# Patient Record
Sex: Female | Born: 1980
Health system: Southern US, Community
[De-identification: ages and names within clinical notes are randomized; demographics above are authoritative.]

## PROBLEM LIST (undated history)

## (undated) ENCOUNTER — Inpatient Hospital Stay (HOSPITAL_COMMUNITY): Payer: Self-pay

## (undated) DIAGNOSIS — H269 Unspecified cataract: Secondary | ICD-10-CM

## (undated) DIAGNOSIS — R519 Headache, unspecified: Secondary | ICD-10-CM

## (undated) DIAGNOSIS — M7918 Myalgia, other site: Secondary | ICD-10-CM

## (undated) DIAGNOSIS — Z98891 History of uterine scar from previous surgery: Secondary | ICD-10-CM

## (undated) DIAGNOSIS — F32A Depression, unspecified: Secondary | ICD-10-CM

## (undated) DIAGNOSIS — M2550 Pain in unspecified joint: Secondary | ICD-10-CM

## (undated) DIAGNOSIS — M62838 Other muscle spasm: Secondary | ICD-10-CM

## (undated) DIAGNOSIS — F419 Anxiety disorder, unspecified: Secondary | ICD-10-CM

## (undated) DIAGNOSIS — R1310 Dysphagia, unspecified: Secondary | ICD-10-CM

## (undated) DIAGNOSIS — F329 Major depressive disorder, single episode, unspecified: Secondary | ICD-10-CM

## (undated) DIAGNOSIS — M791 Myalgia, unspecified site: Secondary | ICD-10-CM

## (undated) DIAGNOSIS — M792 Neuralgia and neuritis, unspecified: Secondary | ICD-10-CM

## (undated) DIAGNOSIS — R42 Dizziness and giddiness: Secondary | ICD-10-CM

## (undated) DIAGNOSIS — M5030 Other cervical disc degeneration, unspecified cervical region: Secondary | ICD-10-CM

## (undated) DIAGNOSIS — R51 Headache: Secondary | ICD-10-CM

## (undated) DIAGNOSIS — R52 Pain, unspecified: Secondary | ICD-10-CM

## (undated) DIAGNOSIS — R29898 Other symptoms and signs involving the musculoskeletal system: Secondary | ICD-10-CM

## (undated) DIAGNOSIS — M609 Myositis, unspecified: Secondary | ICD-10-CM

## (undated) DIAGNOSIS — M5412 Radiculopathy, cervical region: Secondary | ICD-10-CM

## (undated) HISTORY — DX: Other symptoms and signs involving the musculoskeletal system: R29.898

## (undated) HISTORY — DX: Anxiety disorder, unspecified: F41.9

## (undated) HISTORY — DX: Headache, unspecified: R51.9

## (undated) HISTORY — DX: Unspecified cataract: H26.9

## (undated) HISTORY — DX: Dizziness and giddiness: R42

## (undated) HISTORY — DX: Dysphagia, unspecified: R13.10

## (undated) HISTORY — DX: Headache: R51

## (undated) HISTORY — DX: Myalgia, unspecified site: M79.10

## (undated) HISTORY — DX: Depression, unspecified: F32.A

## (undated) HISTORY — DX: Other muscle spasm: M62.838

## (undated) HISTORY — DX: Neuralgia and neuritis, unspecified: M79.2

## (undated) HISTORY — DX: Myositis, unspecified: M60.9

## (undated) HISTORY — DX: Radiculopathy, cervical region: M54.12

## (undated) HISTORY — DX: Pain in unspecified joint: M25.50

## (undated) HISTORY — DX: Major depressive disorder, single episode, unspecified: F32.9

## (undated) HISTORY — DX: Myalgia, other site: M79.18

## (undated) HISTORY — DX: Other cervical disc degeneration, unspecified cervical region: M50.30

---

## 2001-10-28 ENCOUNTER — Other Ambulatory Visit: Admission: RE | Admit: 2001-10-28 | Discharge: 2001-10-28 | Payer: Self-pay | Admitting: Obstetrics & Gynecology

## 2005-04-24 HISTORY — PX: ECTOPIC PREGNANCY SURGERY: SHX613

## 2006-01-25 ENCOUNTER — Emergency Department (HOSPITAL_COMMUNITY): Admission: EM | Admit: 2006-01-25 | Discharge: 2006-01-25 | Payer: Self-pay | Admitting: Emergency Medicine

## 2008-11-11 ENCOUNTER — Encounter: Admission: RE | Admit: 2008-11-11 | Discharge: 2008-11-11 | Payer: Self-pay | Admitting: Family Medicine

## 2008-12-17 ENCOUNTER — Ambulatory Visit (HOSPITAL_COMMUNITY): Admission: RE | Admit: 2008-12-17 | Discharge: 2008-12-17 | Payer: Self-pay | Admitting: Chiropractic Medicine

## 2009-06-26 ENCOUNTER — Emergency Department (HOSPITAL_COMMUNITY): Admission: EM | Admit: 2009-06-26 | Discharge: 2009-06-26 | Payer: Self-pay | Admitting: Emergency Medicine

## 2009-12-13 ENCOUNTER — Encounter
Admission: RE | Admit: 2009-12-13 | Discharge: 2009-12-13 | Payer: Self-pay | Admitting: Physical Medicine and Rehabilitation

## 2010-01-18 ENCOUNTER — Encounter
Admission: RE | Admit: 2010-01-18 | Discharge: 2010-01-18 | Payer: Self-pay | Admitting: Physical Medicine and Rehabilitation

## 2010-04-24 HISTORY — PX: CERVICAL FUSION: SHX112

## 2010-07-17 LAB — URINALYSIS, ROUTINE W REFLEX MICROSCOPIC
Bilirubin Urine: NEGATIVE
Glucose, UA: NEGATIVE mg/dL
Hgb urine dipstick: NEGATIVE
Ketones, ur: NEGATIVE mg/dL
Nitrite: NEGATIVE
Protein, ur: NEGATIVE mg/dL
Specific Gravity, Urine: 1.01 (ref 1.005–1.030)
Urobilinogen, UA: 0.2 mg/dL (ref 0.0–1.0)
pH: 7.5 (ref 5.0–8.0)

## 2010-07-17 LAB — URINE MICROSCOPIC-ADD ON

## 2010-07-17 LAB — POCT PREGNANCY, URINE: Preg Test, Ur: NEGATIVE

## 2010-07-30 LAB — HCG, SERUM, QUALITATIVE: Preg, Serum: NEGATIVE

## 2010-08-13 ENCOUNTER — Emergency Department (HOSPITAL_COMMUNITY): Payer: No Typology Code available for payment source

## 2010-08-13 ENCOUNTER — Emergency Department (HOSPITAL_COMMUNITY)
Admission: EM | Admit: 2010-08-13 | Discharge: 2010-08-14 | Disposition: A | Discharge status: Home / Self Care | Payer: No Typology Code available for payment source | Attending: Emergency Medicine | Admitting: Emergency Medicine

## 2010-08-13 DIAGNOSIS — R51 Headache: Secondary | ICD-10-CM | POA: Insufficient documentation

## 2010-08-13 DIAGNOSIS — S058X9A Other injuries of unspecified eye and orbit, initial encounter: Secondary | ICD-10-CM | POA: Insufficient documentation

## 2010-08-13 DIAGNOSIS — H5789 Other specified disorders of eye and adnexa: Secondary | ICD-10-CM | POA: Insufficient documentation

## 2010-08-13 DIAGNOSIS — F411 Generalized anxiety disorder: Secondary | ICD-10-CM | POA: Insufficient documentation

## 2010-08-13 DIAGNOSIS — H538 Other visual disturbances: Secondary | ICD-10-CM | POA: Insufficient documentation

## 2010-08-13 DIAGNOSIS — R220 Localized swelling, mass and lump, head: Secondary | ICD-10-CM | POA: Insufficient documentation

## 2010-08-13 DIAGNOSIS — M545 Low back pain, unspecified: Secondary | ICD-10-CM | POA: Insufficient documentation

## 2010-08-13 DIAGNOSIS — M542 Cervicalgia: Secondary | ICD-10-CM | POA: Insufficient documentation

## 2010-08-13 DIAGNOSIS — R109 Unspecified abdominal pain: Secondary | ICD-10-CM | POA: Insufficient documentation

## 2010-08-13 DIAGNOSIS — IMO0002 Reserved for concepts with insufficient information to code with codable children: Secondary | ICD-10-CM | POA: Insufficient documentation

## 2010-08-13 DIAGNOSIS — S0510XA Contusion of eyeball and orbital tissues, unspecified eye, initial encounter: Secondary | ICD-10-CM | POA: Insufficient documentation

## 2010-08-14 LAB — CBC
HCT: 37.5 % (ref 36.0–46.0)
Hemoglobin: 12.2 g/dL (ref 12.0–15.0)
MCH: 26.6 pg (ref 26.0–34.0)
MCHC: 32.5 g/dL (ref 30.0–36.0)
MCV: 81.9 fL (ref 78.0–100.0)
Platelets: 281 10*3/uL (ref 150–400)
RBC: 4.58 MIL/uL (ref 3.87–5.11)
RDW: 15.9 % — ABNORMAL HIGH (ref 11.5–15.5)
WBC: 11.5 10*3/uL — ABNORMAL HIGH (ref 4.0–10.5)

## 2010-08-14 LAB — PROTIME-INR
INR: 0.98 (ref 0.00–1.49)
Prothrombin Time: 13.2 seconds (ref 11.6–15.2)

## 2010-08-14 LAB — DIFFERENTIAL
Basophils Absolute: 0 10*3/uL (ref 0.0–0.1)
Basophils Relative: 0 % (ref 0–1)
Eosinophils Absolute: 0 10*3/uL (ref 0.0–0.7)
Eosinophils Relative: 0 % (ref 0–5)
Lymphocytes Relative: 5 % — ABNORMAL LOW (ref 12–46)
Lymphs Abs: 0.6 10*3/uL — ABNORMAL LOW (ref 0.7–4.0)
Monocytes Absolute: 0.4 10*3/uL (ref 0.1–1.0)
Monocytes Relative: 4 % (ref 3–12)
Neutro Abs: 10.5 10*3/uL — ABNORMAL HIGH (ref 1.7–7.7)
Neutrophils Relative %: 91 % — ABNORMAL HIGH (ref 43–77)

## 2010-08-15 LAB — SICKLE CELL SCREEN: Sickle Cell Screen: NEGATIVE

## 2010-08-16 LAB — HEMOGLOBINOPATHY EVALUATION
Hemoglobin Other: 0 % (ref 0.0–0.0)
Hgb A2 Quant: 2.5 % (ref 2.2–3.2)
Hgb A: 97.1 % (ref 96.8–97.8)
Hgb F Quant: 0.4 % (ref 0.0–2.0)
Hgb S Quant: 0 % (ref 0.0–0.0)

## 2011-04-21 ENCOUNTER — Emergency Department (INDEPENDENT_AMBULATORY_CARE_PROVIDER_SITE_OTHER)
Admission: EM | Admit: 2011-04-21 | Discharge: 2011-04-21 | Disposition: A | Discharge status: Home / Self Care | Payer: BC Managed Care – PPO | Source: Home / Self Care | Attending: Family Medicine | Admitting: Family Medicine

## 2011-04-21 ENCOUNTER — Emergency Department (INDEPENDENT_AMBULATORY_CARE_PROVIDER_SITE_OTHER): Payer: BC Managed Care – PPO

## 2011-04-21 ENCOUNTER — Encounter: Payer: Self-pay | Admitting: *Deleted

## 2011-04-21 DIAGNOSIS — S6980XA Other specified injuries of unspecified wrist, hand and finger(s), initial encounter: Secondary | ICD-10-CM

## 2011-04-21 DIAGNOSIS — S6990XA Unspecified injury of unspecified wrist, hand and finger(s), initial encounter: Secondary | ICD-10-CM

## 2011-04-21 DIAGNOSIS — S6992XA Unspecified injury of left wrist, hand and finger(s), initial encounter: Secondary | ICD-10-CM

## 2011-04-21 MED ORDER — IBUPROFEN 600 MG PO TABS: 600.0000 mg | ORAL_TABLET | Freq: Three times a day (TID) | ORAL | Status: AC | PRN

## 2011-04-21 MED ORDER — TRAMADOL HCL 50 MG PO TABS: 50.0000 mg | ORAL_TABLET | Freq: Four times a day (QID) | ORAL | Status: AC | PRN

## 2011-04-21 NOTE — ED Notes (Signed)
Pt  States  She  Has  An     Old  Finger    Tendon injury    To  l  Small  Finger  She  Reports  While  At the  Gym today  She  Injured  Her  Finger      She  Reports  She  Snagged  It  On a   Persons  Shirt  The  Finger is  Swollen  And  painfull

## 2011-04-23 NOTE — ED Provider Notes (Signed)
History     CSN: [id number]  Arrival date & time 04/21/11  8295   First MD Initiated Contact with Patient 04/21/11 1948      Chief Complaint  Patient presents with  . Finger Injury    (Consider location/radiation/quality/duration/timing/severity/associated sxs/prior treatment) HPI Comments: 30 y/o female with h/o extensor injury in left 5th finger for what she is being followed by Dr. Merlyn Lot. Here c/o re injury in same digit today while she was in the gym and got caught in another person's shirt when she extender her arms while she was working out. States noticed increased pain and swelling. Pt mentioned several times she was wearing the dynamic splint provided at orthopedist office.   History reviewed. No pertinent past medical history.  Past Surgical History  Procedure Date  . Cervical fusion     History reviewed. No pertinent family history.  History  Substance Use Topics  . Smoking status: Not on file  . Smokeless tobacco: Not on file  . Alcohol Use:     OB History    Grav Para Term Preterm Abortions TAB SAB Ect Mult Living                  Review of Systems  All other systems reviewed and are negative.    Allergies  Review of patient's allergies indicates no known allergies.  Home Medications   Current Outpatient Rx  Name Route Sig Dispense Refill  . CYCLOBENZAPRINE HCL 10 MG PO TABS Oral Take 10 mg by mouth 3 (three) times daily as needed.      Marland Kitchen NAPROXEN 500 MG PO TABS Oral Take 500 mg by mouth 2 (two) times daily with a meal.      . IBUPROFEN 600 MG PO TABS Oral Take 1 tablet (600 mg total) by mouth every 8 (eight) hours as needed for pain. 20 tablet 0  . TRAMADOL HCL 50 MG PO TABS Oral Take 1 tablet (50 mg total) by mouth every 6 (six) hours as needed for pain. Maximum dose= 8 tablets per day 15 tablet 0    BP 124/89  Pulse 80  Temp(Src) 98.2 F (36.8 C) (Oral)  Resp 16  SpO2 100%  LMP 03/22/2011  Physical Exam  Nursing note and vitals  reviewed. Constitutional: She is oriented to person, place, and time.  Cardiovascular: Normal rate, regular rhythm and normal heart sounds.   Pulmonary/Chest: Breath sounds normal.  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 With TTP and moderate swelling of PIPJ. No bruising or swelling in any other areas. Distal phalange looks oblique and internally deviated although noticed similar in right hand. Decreased range of motion not new as per patient.  Neurological: She is alert and oriented to person, place, and time.    ED Course  Procedures (including critical care time)  Labs Reviewed - No data to display Dg Finger Little Left  04/21/2011  *RADIOLOGY REPORT*  Clinical Data: Small finger injury 3 months ago.  Popping sensation with increased pain and deformity.  LEFT LITTLE FINGER 2+V  Comparison: None.  Findings: The small finger is flexed at the proximal interphalangeal joint and extended at the distal interphalangeal joint, with soft tissue swelling dorsal to the proximal interphalangeal joint.  No fracture or dislocation is observed.  A small degenerative subcortical cyst or geode is present medially in the head of the proximal phalanx.  IMPRESSION:  1.  Boutonniere deformity of the small finger, with soft tissue swelling  dorsal to the proximal interphalangeal joint.  Original Report Authenticated By: Dellia Cloud, M.D.     1. Injury of fifth finger of left hand       MDM  No new bone injury or fracture in X-rays. Boutonniere deformity not new. Pt has appointment with hand specialist in 2 days. Was placed in same splint she has been using and encouraged to keep in place and avoid reinjury. Prescriptions for ibuprofen and tramadol given. Follow up with hand specialist as scheduled.        Sharin Grave, MD 04/23/11 [phone]

## 2011-10-21 ENCOUNTER — Ambulatory Visit: Payer: BC Managed Care – PPO | Admitting: Family Medicine

## 2011-10-21 VITALS — BP 122/82 | HR 65 | Temp 97.5°F | Resp 16 | Ht 66.58 in | Wt 193.4 lb

## 2011-10-21 DIAGNOSIS — H6120 Impacted cerumen, unspecified ear: Secondary | ICD-10-CM

## 2011-10-21 DIAGNOSIS — H8110 Benign paroxysmal vertigo, unspecified ear: Secondary | ICD-10-CM

## 2011-10-21 MED ORDER — MECLIZINE HCL 32 MG PO TABS: 32.0000 mg | ORAL_TABLET | Freq: Three times a day (TID) | ORAL | Status: DC | PRN

## 2011-10-21 NOTE — Progress Notes (Signed)
31 yo woman with acute vertigo and nausea upon awakening yesterday.  No tinnitus, hearing loss or otalgia.  No h/o vertigo, head trauma Patient has a h/o neck surgery and left eye injury with vision loss (remote) No headache or neck pain, vision change  O:  No acute distress Fundi:  Normal Tm's: cerumen right ear canal lavaged and now clear Neck:  Keloid Oroph:  Normal CNIII-XII: normal Mental status:  Normal Motor: intact x 4 extrem  A:  Acute vertigo  P:  Meclizine 1. Vertigo, benign positional  meclizine (ANTIVERT) 32 MG tablet

## 2011-10-23 ENCOUNTER — Other Ambulatory Visit: Payer: Self-pay | Admitting: *Deleted

## 2011-10-23 MED ORDER — MECLIZINE HCL 25 MG PO TABS: 25.0000 mg | ORAL_TABLET | Freq: Three times a day (TID) | ORAL | Status: AC | PRN

## 2011-10-24 ENCOUNTER — Ambulatory Visit (INDEPENDENT_AMBULATORY_CARE_PROVIDER_SITE_OTHER): Payer: BC Managed Care – PPO | Admitting: Internal Medicine

## 2011-10-24 VITALS — BP 130/92 | HR 76 | Temp 98.3°F | Resp 16 | Ht 65.25 in | Wt 192.4 lb

## 2011-10-24 DIAGNOSIS — R42 Dizziness and giddiness: Secondary | ICD-10-CM

## 2011-10-24 DIAGNOSIS — R51 Headache: Secondary | ICD-10-CM

## 2011-10-24 LAB — POCT CBC
Granulocyte percent: 55.7 %G (ref 37–80)
HCT, POC: 42.2 % (ref 37.7–47.9)
Hemoglobin: 12.4 g/dL (ref 12.2–16.2)
Lymph, poc: 2 (ref 0.6–3.4)
MCH, POC: 26 pg — AB (ref 27–31.2)
MCHC: 29.4 g/dL — AB (ref 31.8–35.4)
MCV: 88.5 fL (ref 80–97)
MID (cbc): 0.3 (ref 0–0.9)
MPV: 9 fL (ref 0–99.8)
POC Granulocyte: 2.9 (ref 2–6.9)
POC LYMPH PERCENT: 37.7 %L (ref 10–50)
POC MID %: 6.6 %M (ref 0–12)
Platelet Count, POC: 305 10*3/uL (ref 142–424)
RBC: 4.77 M/uL (ref 4.04–5.48)
RDW, POC: 15.6 %
WBC: 5.2 10*3/uL (ref 4.6–10.2)

## 2011-10-24 NOTE — Progress Notes (Signed)
  Subjective:    Patient ID: Victoria Wagner, female    DOB: 04-02-1981, 31 y.o.   MRN: [id number]  HPI C. Recent visit/only mildly improved/unable to work Unable to eat, headache.  Feels like she is on a cruise ship.  No trouble sleeping.  Feels like the room is spinning and worse in the morning.  Lying down and getting up is a problem Has a bifrontal pressure headache, made worse with movement/Mild nausea with decreased appetite but no vomiting No weakness   Review of Systems No blurred vision, no fever, no diarrhea, no dysuria or frequency, no edema.  No URI or sinus symptoms  ? Palpatations- Feels like rhythm is just a little faster, no chest pains.   History of surgery on neck which contributes to some headaches, but this headache is different     Objective:   Physical Exam Blood pressure normal Pupils reactive to light, left pupil larger than right pupil consistent with past history EOMs conjugate No nystagmus TMs clear/canals clear Oropharynx clear Thyroid without nodules or swelling No lymphadenopathy Lying down produces dizziness Reflexes normal Heart regular without murmur Abdomen benign Sitting up produces dizziness Romberg is negative Finger to nose intact Deep tendon reflexes symmetrical without clonus or Babinski      Results for orders placed in visit on 10/24/11  POCT CBC      Component Value Range   WBC 5.2  4.6 - 10.2 K/uL   Lymph, poc 2.0  0.6 - 3.4   POC LYMPH PERCENT 37.7  10 - 50 %L   MID (cbc) 0.3  0 - 0.9   POC MID % 6.6  0 - 12 %M   POC Granulocyte 2.9  2 - 6.9   Granulocyte percent 55.7  37 - 80 %G   RBC 4.77  4.04 - 5.48 M/uL   Hemoglobin 12.4  12.2 - 16.2 g/dL   HCT, POC 40.9  81.1 - 47.9 %   MCV 88.5  80 - 97 fL   MCH, POC 26.0 (*) 27 - 31.2 pg   MCHC 29.4 (*) 31.8 - 35.4 g/dL   RDW, POC 91.4     Platelet Count, POC 305  142 - 424 K/uL   MPV 9.0  0 - 99.8 fL        Assessment & Plan:  Problem #1 vertigo Continue meclizine Add  postural maneuvers brandt -darhoff  Recheck 5 days if not well

## 2011-12-30 ENCOUNTER — Emergency Department (HOSPITAL_COMMUNITY): Payer: BC Managed Care – PPO

## 2011-12-30 ENCOUNTER — Emergency Department (HOSPITAL_COMMUNITY)
Admission: EM | Admit: 2011-12-30 | Discharge: 2011-12-30 | Disposition: A | Discharge status: Home / Self Care | Payer: BC Managed Care – PPO | Attending: Emergency Medicine | Admitting: Emergency Medicine

## 2011-12-30 ENCOUNTER — Encounter (HOSPITAL_COMMUNITY): Payer: Self-pay | Admitting: Emergency Medicine

## 2011-12-30 DIAGNOSIS — N390 Urinary tract infection, site not specified: Secondary | ICD-10-CM | POA: Insufficient documentation

## 2011-12-30 DIAGNOSIS — O2390 Unspecified genitourinary tract infection in pregnancy, unspecified trimester: Secondary | ICD-10-CM | POA: Insufficient documentation

## 2011-12-30 DIAGNOSIS — R109 Unspecified abdominal pain: Secondary | ICD-10-CM | POA: Insufficient documentation

## 2011-12-30 DIAGNOSIS — Z3490 Encounter for supervision of normal pregnancy, unspecified, unspecified trimester: Secondary | ICD-10-CM

## 2011-12-30 LAB — COMPREHENSIVE METABOLIC PANEL
ALT: 10 U/L (ref 0–35)
AST: 12 U/L (ref 0–37)
Albumin: 3.1 g/dL — ABNORMAL LOW (ref 3.5–5.2)
Alkaline Phosphatase: 71 U/L (ref 39–117)
BUN: 11 mg/dL (ref 6–23)
CO2: 28 mEq/L (ref 19–32)
Calcium: 9.1 mg/dL (ref 8.4–10.5)
Chloride: 104 mEq/L (ref 96–112)
Creatinine, Ser: 0.87 mg/dL (ref 0.50–1.10)
GFR calc Af Amer: >90 mL/min (ref >90)
GFR calc non Af Amer: 89 mL/min — ABNORMAL LOW (ref >90)
Glucose, Bld: 84 mg/dL (ref 70–99)
Potassium: 3.8 mEq/L (ref 3.5–5.1)
Sodium: 139 mEq/L (ref 135–145)
Total Bilirubin: 0.1 mg/dL — ABNORMAL LOW (ref 0.3–1.2)
Total Protein: 6.9 g/dL (ref 6.0–8.3)

## 2011-12-30 LAB — CBC WITH DIFFERENTIAL/PLATELET
Basophils Absolute: 0 10*3/uL (ref 0.0–0.1)
Basophils Relative: 0 % (ref 0–1)
Eosinophils Absolute: 0.1 10*3/uL (ref 0.0–0.7)
Eosinophils Relative: 1 % (ref 0–5)
HCT: 38.1 % (ref 36.0–46.0)
Hemoglobin: 12.3 g/dL (ref 12.0–15.0)
Lymphocytes Relative: 33 % (ref 12–46)
Lymphs Abs: 2.1 10*3/uL (ref 0.7–4.0)
MCH: 27.4 pg (ref 26.0–34.0)
MCHC: 32.3 g/dL (ref 30.0–36.0)
MCV: 84.9 fL (ref 78.0–100.0)
Monocytes Absolute: 0.4 10*3/uL (ref 0.1–1.0)
Monocytes Relative: 6 % (ref 3–12)
Neutro Abs: 3.7 10*3/uL (ref 1.7–7.7)
Neutrophils Relative %: 60 % (ref 43–77)
Platelets: 252 10*3/uL (ref 150–400)
RBC: 4.49 MIL/uL (ref 3.87–5.11)
RDW: 14.2 % (ref 11.5–15.5)
WBC: 6.2 10*3/uL (ref 4.0–10.5)

## 2011-12-30 LAB — URINALYSIS, ROUTINE W REFLEX MICROSCOPIC
Bilirubin Urine: NEGATIVE
Glucose, UA: NEGATIVE mg/dL
Hgb urine dipstick: NEGATIVE
Ketones, ur: NEGATIVE mg/dL
Nitrite: NEGATIVE
Protein, ur: NEGATIVE mg/dL
Specific Gravity, Urine: 1.022 (ref 1.005–1.030)
Urobilinogen, UA: 1 mg/dL (ref 0.0–1.0)
pH: 7.5 (ref 5.0–8.0)

## 2011-12-30 LAB — URINE MICROSCOPIC-ADD ON

## 2011-12-30 LAB — HCG, QUANTITATIVE, PREGNANCY: hCG, Beta Chain, Quant, S: 40471 m[IU]/mL — ABNORMAL HIGH (ref <5)

## 2011-12-30 LAB — LIPASE, BLOOD: Lipase: 36 U/L (ref 11–59)

## 2011-12-30 MED ORDER — NITROFURANTOIN MONOHYD MACRO 100 MG PO CAPS: 100.0000 mg | ORAL_CAPSULE | Freq: Two times a day (BID) | ORAL | Status: AC

## 2011-12-30 NOTE — ED Notes (Signed)
Pt. Stated, I've had an ectopic pregnancy in 2007 and I'm having the same pain on my lt. Lateral side and my abd. A little.  My Dr. York Spaniel to come here if i had this pain

## 2011-12-30 NOTE — ED Provider Notes (Signed)
History     CSN: [id number]  Arrival date & time 12/30/11  8413   First MD Initiated Contact with Patient 12/30/11 0805      Chief Complaint  Patient presents with  . Abdominal Pain    (Consider location/radiation/quality/duration/timing/severity/associated sxs/prior treatment) Patient is a 31 y.o. female presenting with abdominal pain. The history is provided by the patient.  Abdominal Pain The primary symptoms of the illness include abdominal pain. The primary symptoms of the illness do not include nausea, vomiting, vaginal discharge or vaginal bleeding.  Symptoms associated with the illness do not include chills or constipation.   patient's had some left-sided crampy abdominal pain for last couple days. She is [redacted] weeks pregnant and has had a home pregnancy test and has been seen by her gynecologist. She states she had a previous ectopic pregnancy in the right tube in 2007. This pain is crampy and feels somewhat like that. No vaginal bleeding or discharge. No nausea vomiting or diarrhea. No fevers. No change in appetite.  History reviewed. No pertinent past medical history.  Past Surgical History  Procedure Date  . Cervical fusion   . Ectopic pregnancy surgery     No family history on file.  History  Substance Use Topics  . Smoking status: Never Smoker   . Smokeless tobacco: Not on file  . Alcohol Use: No    OB History    Grav Para Term Preterm Abortions TAB SAB Ect Mult Living   1               Review of Systems  Constitutional: Negative for chills.  Respiratory: Negative for chest tightness.   Gastrointestinal: Positive for abdominal pain. Negative for nausea, vomiting and constipation.  Genitourinary: Negative for flank pain, vaginal bleeding and vaginal discharge.  Neurological: Negative for syncope and headaches.    Allergies  Review of patient's allergies indicates no known allergies.  Home Medications   Current Outpatient Rx  Name Route Sig Dispense  Refill  . CYCLOBENZAPRINE HCL 5 MG PO TABS Oral Take 5 mg by mouth 3 (three) times daily as needed. Muscle spasms    . OVER THE COUNTER MEDICATION Topical Apply 1 application topically 2 (two) times daily as needed. Apply to neck for pain DERMA TEND    . PRENATAL MULTIVITAMIN CH Oral Take 1 tablet by mouth daily.    Marland Kitchen NITROFURANTOIN MONOHYD MACRO 100 MG PO CAPS Oral Take 1 capsule (100 mg total) by mouth 2 (two) times daily. 10 capsule 0    BP 129/78  Pulse 74  Temp 98.5 F (36.9 C) (Oral)  Resp 20  SpO2 100%  LMP 11/19/2011  Physical Exam  Nursing note and vitals reviewed. Constitutional: She is oriented to person, place, and time. She appears well-developed and well-nourished.  HENT:  Head: Normocephalic and atraumatic.  Eyes: EOM are normal. Pupils are equal, round, and reactive to light.  Neck: Normal range of motion. Neck supple.  Cardiovascular: Normal rate, regular rhythm and normal heart sounds.   No murmur heard. Pulmonary/Chest: Effort normal and breath sounds normal. No respiratory distress. She has no wheezes. She has no rales.  Abdominal: Soft. Bowel sounds are normal. She exhibits no distension. There is tenderness. There is no rebound and no guarding.       Tenderness to left upper quadrant. No rebound or guarding. No mass. Some left sided tenderness.  Musculoskeletal: Normal range of motion.  Neurological: She is alert and oriented to person, place, and time. No  cranial nerve deficit.  Skin: Skin is warm and dry.  Psychiatric: She has a normal mood and affect. Her speech is normal.    ED Course  Procedures (including critical care time)  Labs Reviewed  COMPREHENSIVE METABOLIC PANEL - Abnormal; Notable for the following:    Albumin 3.1 (*)     Total Bilirubin 0.1 (*)     GFR calc non Af Amer 89 (*)     All other components within normal limits  HCG, QUANTITATIVE, PREGNANCY - Abnormal; Notable for the following:    hCG, Beta Chain, Quant, S 40471 (*)     All  other components within normal limits  URINALYSIS, ROUTINE W REFLEX MICROSCOPIC - Abnormal; Notable for the following:    APPearance CLOUDY (*)     Leukocytes, UA MODERATE (*)     All other components within normal limits  URINE MICROSCOPIC-ADD ON - Abnormal; Notable for the following:    Squamous Epithelial / LPF MANY (*)     Bacteria, UA MANY (*)     All other components within normal limits  CBC WITH DIFFERENTIAL  LIPASE, BLOOD  URINE CULTURE   US Ob Comp Less 14 Wks  12/30/2011  *RADIOLOGY REPORT*  Clinical Data: [redacted] weeks pregnant with abdominal pain.  Estimated gestational age by LMP is 6 weeks 2 days.  OBSTETRIC <14 WK [place] AND TRANSVAGINAL OB US  Technique:  Both transabdominal and transvaginal ultrasound examinations were performed for complete evaluation of the gestation as well as the maternal uterus, adnexal regions, and pelvic cul-de-sac.  Transvaginal technique was performed to assess early pregnancy.  Comparison:  None.  Intrauterine gestational sac:  Visualized/normal in shape. Yolk sac: Visualized Embryo: Visualized Cardiac Activity: Visualized Heart Rate: 123 bpm  CRL: 8.8 mm  6 w  6 d         [place] EDC: 08/18/2012  Maternal uterus/adnexae:  Left uterine body subserosal fibroid measures 2.1 x 2.0 x 1.7 cm. A right-sided subserosal fibroid measures 2.3 x 2.0 x 1.7 cm.  Left ovary measures 1.8 x 2.3 x 1.7 cm and has normal appearances. The right ovary measures 2.9 x 1.3 x 1.7 cm and has normal appearances.  A very small subchorionic hemorrhage is noted.  Trace amount of free pelvic fluid noted.  IMPRESSION: 1.  Single living intrauterine pregnancy.  Estimated gestational age by crown-rump length is concordant with dating by LMP. 2.  Two subserosal uterine fibroids, as described above. 3.  Very small subchorionic hemorrhage.  Ultrasound fetal anatomic evaluation at 18 to [redacted] weeks gestational age is recommended.   Original Report Authenticated By: Britta Mccreedy, M.D.    US Ob  Transvaginal  12/30/2011  *RADIOLOGY REPORT*  Clinical Data: [redacted] weeks pregnant with abdominal pain.  Estimated gestational age by LMP is 6 weeks 2 days.  OBSTETRIC <14 WK [place] AND TRANSVAGINAL OB US  Technique:  Both transabdominal and transvaginal ultrasound examinations were performed for complete evaluation of the gestation as well as the maternal uterus, adnexal regions, and pelvic cul-de-sac.  Transvaginal technique was performed to assess early pregnancy.  Comparison:  None.  Intrauterine gestational sac:  Visualized/normal in shape. Yolk sac: Visualized Embryo: Visualized Cardiac Activity: Visualized Heart Rate: 123 bpm  CRL: 8.8 mm  6 w  6 d         [place] EDC: 08/18/2012  Maternal uterus/adnexae:  Left uterine body subserosal fibroid measures 2.1 x 2.0 x 1.7 cm. A right-sided subserosal fibroid measures 2.3 x 2.0 x 1.7 cm.  Left ovary measures 1.8 x 2.3 x 1.7 cm and has normal appearances. The right ovary measures 2.9 x 1.3 x 1.7 cm and has normal appearances.  A very small subchorionic hemorrhage is noted.  Trace amount of free pelvic fluid noted.  IMPRESSION: 1.  Single living intrauterine pregnancy.  Estimated gestational age by crown-rump length is concordant with dating by LMP. 2.  Two subserosal uterine fibroids, as described above. 3.  Very small subchorionic hemorrhage.  Ultrasound fetal anatomic evaluation at 18 to [redacted] weeks gestational age is recommended.   Original Report Authenticated By: Britta Mccreedy, M.D.      1. Pregnant   2. UTI (urinary tract infection)       MDM  Patient with left-sided abdominal pain. History of ectopic pregnancy. Intrauterine pregnancy on ultrasound. Urine does show UTI. There is some left-sided pain, however I do not believe this is a pyelonephritis at this time. Culture was sent and patient will be discharged home on Macrobid.        Juliet Rude. Rubin Payor, MD 12/30/11 1136

## 2011-12-30 NOTE — ED Notes (Addendum)
Pt 6 weeks preg, hx of ectopic in the right, lost fallopian tube, beginning pain  In the left side, concerned for another ectopic, appt with Dr Tamela Oddi on Friday for [place]

## 2011-12-30 NOTE — ED Notes (Signed)
Pt. Unable to urinate  

## 2011-12-30 NOTE — ED Notes (Signed)
Up to the bathroom for urine sample 

## 2011-12-31 LAB — URINE CULTURE: Colony Count: 25000

## 2012-01-05 ENCOUNTER — Other Ambulatory Visit: Payer: Self-pay | Admitting: Obstetrics

## 2012-01-05 ENCOUNTER — Ambulatory Visit (HOSPITAL_COMMUNITY): Payer: BC Managed Care – PPO

## 2012-01-05 DIAGNOSIS — O3680X0 Pregnancy with inconclusive fetal viability, not applicable or unspecified: Secondary | ICD-10-CM

## 2012-05-12 ENCOUNTER — Inpatient Hospital Stay (HOSPITAL_COMMUNITY)
Admission: AD | Admit: 2012-05-12 | Discharge: 2012-05-12 | Disposition: A | Discharge status: Home / Self Care | Payer: BC Managed Care – PPO | Source: Ambulatory Visit | Attending: Obstetrics and Gynecology | Admitting: Obstetrics and Gynecology

## 2012-05-12 ENCOUNTER — Encounter (HOSPITAL_COMMUNITY): Payer: Self-pay

## 2012-05-12 DIAGNOSIS — N949 Unspecified condition associated with female genital organs and menstrual cycle: Secondary | ICD-10-CM | POA: Insufficient documentation

## 2012-05-12 DIAGNOSIS — O99891 Other specified diseases and conditions complicating pregnancy: Secondary | ICD-10-CM | POA: Insufficient documentation

## 2012-05-12 DIAGNOSIS — R109 Unspecified abdominal pain: Secondary | ICD-10-CM | POA: Insufficient documentation

## 2012-05-12 LAB — URINE MICROSCOPIC-ADD ON

## 2012-05-12 LAB — URINALYSIS, ROUTINE W REFLEX MICROSCOPIC
Bilirubin Urine: NEGATIVE
Glucose, UA: NEGATIVE mg/dL
Hgb urine dipstick: NEGATIVE
Ketones, ur: NEGATIVE mg/dL
Nitrite: NEGATIVE
Protein, ur: NEGATIVE mg/dL
Specific Gravity, Urine: >1.03 — ABNORMAL HIGH (ref 1.005–1.030)
Urobilinogen, UA: 0.2 mg/dL (ref 0.0–1.0)
pH: 5.5 (ref 5.0–8.0)

## 2012-05-12 NOTE — MAU Note (Signed)
Going to physical therapy for pelvic pain since 3 months gestation. Made a fast movement earlier today that made pelvic/vaginal pain worse. Denies leaking of fluid or vaginal bleeding.

## 2012-05-12 NOTE — MAU Note (Signed)
Pt taken off EFM per Dr. Ernestina Penna

## 2012-05-12 NOTE — H&P (Addendum)
Chief complaint: Pubic symphysis pain  History of present illness: 32 year old G2 P0 010 at 26 weeks presents with acute worsening of her chronic pubic symphysis pain. Patient notes pain began between the third and fourth month of pregnancy. Since then she has been on bed rest at home, taking Tylenol when necessary for pain and is seeing a physical therapist once weekly. Patient also has a prescription for hydrocodone although she has not been taking this due to concerns of risk to the pregnancy. Patient states she usually gets out of bed for meals and bathroom means but otherwise mostly is confined to the bed. She notes this morning she got out of the bed and upon twisting had acute worsening of her pain. Patient direct pain to the pubic symphysis without much radiation. Patient notes no fever, no vaginal bleeding, no uterine contractions, no nausea or vomiting. Patient has tried Tylenol which carried a headache but did not help with her pelvic pain. She did not try any narcotics. The patient has been using ice with some minimal relief. Patient notes needing assistance to ambulate. this is new since this morning.  Patient denies contractions and notes good fetal movement.  Prenatal issues: Pubic symphysis pain as above, denies any other complications with pregnancy  Past surgical history: Ectopic pregnancy, cervical neck fusion  Past medical history: Keloid former, cervical disc disease  Medications: Tylenol  Physical exam: Filed Vitals:   05/12/12 2102 05/12/12 2124  BP:  114/74  Pulse:  77  Temp:  98 F (36.7 C)  TempSrc:  Oral  Resp:  18  Height: 5\' 6"  (1.676 m)   Weight: 105.597 kg (232 lb 12.8 oz)   SpO2:  98%   general: Well-appearing obese female in no distress Neck: Keloid on right aspect of neck Cardiovascular: Regular rate and rhythm Pulmonary: Clear to auscultation bilaterally Abdomen: Obese, gravid, nontender, no fundal tenderness, tenderness over the pubic symphysis with  direct pressure, but no obvious bruising, no fluctuance, no cellulitis, no crepitus Musculoskeletal: Good passive range of motion at the hip joints GU: Normal external genitalia, normal vagina, no abnormal discharge, no blood, no cervical motion tenderness, no uterine tenderness, no adnexal masses, uterus closed and long, difficult to determine presenting part, fetal station high in the pelvis Lower extremities: Nontender, no edema  Urinalysis: Reviewed  Assessment and plan: 32 year old G2 P0 at 53 weeks with chronic pubic symphysis pain here with an acute exacerbation of pain secondary to musculoskeletal trauma this afternoon. Patient with adequate range of motion. Do not recommend additional imaging studies at this time. No evidence for acute fracture on exam. Would recommend expectant management at this time with rest, ice, pain medicines as needed. Patient will followup for her routine OB visit tomorrow and plans to continue with physical therapy later this week  Bedrest. Patient has been on modified bed rest for the past few weeks. I discussed with patient the increased risk of deep venous thrombosis in pregnancy with addressed. I suggested patient walk around the house each time she rises for meals and bathroom means. We also reviewed that exercises that I recommended she do hourly.  Isaia Hassell A. 05/12/2012 11:04 PM    additional physical exam signs include NST: 150s, 10 x 10 accelerations, no decelerations, 10 beat variability. Tocometry: No contractions  Additional assessment and plan: Patient has hydrocodone at home and was instructed to use it for pain control tonight.

## 2012-05-13 LAB — URINE CULTURE: Colony Count: 40000

## 2012-07-30 ENCOUNTER — Other Ambulatory Visit: Payer: Self-pay | Admitting: Obstetrics and Gynecology

## 2012-07-31 ENCOUNTER — Inpatient Hospital Stay (HOSPITAL_COMMUNITY)
Admission: AD | Admit: 2012-07-31 | Discharge: 2012-08-03 | DRG: 371 | Disposition: A | Discharge status: Home / Self Care | Payer: BC Managed Care – PPO | Source: Ambulatory Visit | Attending: Obstetrics and Gynecology | Admitting: Obstetrics and Gynecology

## 2012-07-31 ENCOUNTER — Inpatient Hospital Stay (HOSPITAL_COMMUNITY): Payer: BC Managed Care – PPO | Admitting: Anesthesiology

## 2012-07-31 ENCOUNTER — Encounter (HOSPITAL_COMMUNITY): Payer: Self-pay | Admitting: *Deleted

## 2012-07-31 ENCOUNTER — Encounter (HOSPITAL_COMMUNITY): Payer: Self-pay | Admitting: Pharmacy Technician

## 2012-07-31 ENCOUNTER — Encounter (HOSPITAL_COMMUNITY)
Admission: AD | Disposition: A | Discharge status: Home / Self Care | Payer: Self-pay | Source: Ambulatory Visit | Attending: Obstetrics and Gynecology

## 2012-07-31 ENCOUNTER — Encounter (HOSPITAL_COMMUNITY): Payer: Self-pay | Admitting: Anesthesiology

## 2012-07-31 DIAGNOSIS — G8929 Other chronic pain: Secondary | ICD-10-CM | POA: Diagnosis present

## 2012-07-31 DIAGNOSIS — D649 Anemia, unspecified: Secondary | ICD-10-CM | POA: Diagnosis not present

## 2012-07-31 DIAGNOSIS — IMO0001 Reserved for inherently not codable concepts without codable children: Secondary | ICD-10-CM | POA: Diagnosis present

## 2012-07-31 DIAGNOSIS — O99214 Obesity complicating childbirth: Secondary | ICD-10-CM | POA: Diagnosis present

## 2012-07-31 DIAGNOSIS — O99892 Other specified diseases and conditions complicating childbirth: Principal | ICD-10-CM | POA: Diagnosis present

## 2012-07-31 DIAGNOSIS — Z993 Dependence on wheelchair: Secondary | ICD-10-CM

## 2012-07-31 DIAGNOSIS — O9903 Anemia complicating the puerperium: Secondary | ICD-10-CM | POA: Diagnosis not present

## 2012-07-31 DIAGNOSIS — Z98891 History of uterine scar from previous surgery: Secondary | ICD-10-CM

## 2012-07-31 DIAGNOSIS — M6200 Separation of muscle (nontraumatic), unspecified site: Secondary | ICD-10-CM | POA: Diagnosis present

## 2012-07-31 DIAGNOSIS — E669 Obesity, unspecified: Secondary | ICD-10-CM | POA: Diagnosis present

## 2012-07-31 HISTORY — DX: History of uterine scar from previous surgery: Z98.891

## 2012-07-31 LAB — CBC
HCT: 39.8 % (ref 36.0–46.0)
Hemoglobin: 12.9 g/dL (ref 12.0–15.0)
MCH: 27.4 pg (ref 26.0–34.0)
MCHC: 32.4 g/dL (ref 30.0–36.0)
MCV: 84.7 fL (ref 78.0–100.0)
Platelets: 210 10*3/uL (ref 150–400)
RBC: 4.7 MIL/uL (ref 3.87–5.11)
RDW: 16.4 % — ABNORMAL HIGH (ref 11.5–15.5)
WBC: 9.6 10*3/uL (ref 4.0–10.5)

## 2012-07-31 LAB — ABO/RH: ABO/RH(D): O POS

## 2012-07-31 LAB — RPR: RPR Ser Ql: NONREACTIVE

## 2012-07-31 LAB — TYPE AND SCREEN
ABO/RH(D): O POS
Antibody Screen: NEGATIVE

## 2012-07-31 SURGERY — Surgical Case
Anesthesia: Spinal | Wound class: Clean Contaminated

## 2012-07-31 MED ORDER — NALOXONE HCL 1 MG/ML IJ SOLN: 1.0000 ug/kg/h | INTRAVENOUS | Status: DC | PRN

## 2012-07-31 MED ORDER — SODIUM CHLORIDE 0.9 % IJ SOLN: 3.0000 mL | INTRAMUSCULAR | Status: DC | PRN

## 2012-07-31 MED ORDER — FENTANYL CITRATE 0.05 MG/ML IJ SOLN
INTRAMUSCULAR | Status: DC | PRN
  Administered 2012-07-31: 25 ug via INTRATHECAL

## 2012-07-31 MED ORDER — SCOPOLAMINE 1 MG/3DAYS TD PT72
MEDICATED_PATCH | TRANSDERMAL | Status: AC
  Administered 2012-07-31: 1.5 mg via TRANSDERMAL
  Filled 2012-07-31: qty 1

## 2012-07-31 MED ORDER — OXYCODONE-ACETAMINOPHEN 5-325 MG PO TABS
1.0000 | ORAL_TABLET | ORAL | Status: DC | PRN
  Administered 2012-08-01 (×3): 1 via ORAL
  Administered 2012-08-01: 2 via ORAL
  Administered 2012-08-01 – 2012-08-03 (×8): 1 via ORAL
  Filled 2012-07-31 (×7): qty 1
  Filled 2012-07-31: qty 2
  Filled 2012-07-31 (×5): qty 1

## 2012-07-31 MED ORDER — MORPHINE SULFATE (PF) 0.5 MG/ML IJ SOLN
INTRAMUSCULAR | Status: DC | PRN
  Administered 2012-07-31: .1 mg via INTRATHECAL

## 2012-07-31 MED ORDER — ONDANSETRON HCL 4 MG/2ML IJ SOLN
INTRAMUSCULAR | Status: AC
  Filled 2012-07-31: qty 2

## 2012-07-31 MED ORDER — DIPHENHYDRAMINE HCL 50 MG/ML IJ SOLN: 12.5000 mg | INTRAMUSCULAR | Status: DC | PRN

## 2012-07-31 MED ORDER — METHYLERGONOVINE MALEATE 0.2 MG PO TABS: 0.2000 mg | ORAL_TABLET | ORAL | Status: DC | PRN

## 2012-07-31 MED ORDER — EPHEDRINE SULFATE 50 MG/ML IJ SOLN
INTRAMUSCULAR | Status: DC | PRN
  Administered 2012-07-31 (×4): 5 mg via INTRAVENOUS
  Administered 2012-07-31: 10 mg via INTRAVENOUS
  Administered 2012-07-31: 5 mg via INTRAVENOUS
  Administered 2012-07-31: 10 mg via INTRAVENOUS
  Administered 2012-07-31: 5 mg via INTRAVENOUS

## 2012-07-31 MED ORDER — KETOROLAC TROMETHAMINE 30 MG/ML IJ SOLN: 30.0000 mg | Freq: Four times a day (QID) | INTRAMUSCULAR | Status: AC | PRN

## 2012-07-31 MED ORDER — KETOROLAC TROMETHAMINE 30 MG/ML IJ SOLN
30.0000 mg | Freq: Four times a day (QID) | INTRAMUSCULAR | Status: AC | PRN
  Administered 2012-07-31: 30 mg via INTRAVENOUS
  Filled 2012-07-31: qty 1

## 2012-07-31 MED ORDER — CEFAZOLIN SODIUM-DEXTROSE 2-3 GM-% IV SOLR
2.0000 g | INTRAVENOUS | Status: AC
  Administered 2012-07-31: 2 g via INTRAVENOUS

## 2012-07-31 MED ORDER — LACTATED RINGERS IV SOLN
INTRAVENOUS | Status: DC
  Administered 2012-07-31: 13:00:00 via INTRAVENOUS

## 2012-07-31 MED ORDER — LANOLIN HYDROUS EX OINT: 1.0000 "application " | TOPICAL_OINTMENT | CUTANEOUS | Status: DC | PRN

## 2012-07-31 MED ORDER — PROMETHAZINE HCL 25 MG/ML IJ SOLN: 6.2500 mg | INTRAMUSCULAR | Status: DC | PRN

## 2012-07-31 MED ORDER — IBUPROFEN 600 MG PO TABS
600.0000 mg | ORAL_TABLET | Freq: Four times a day (QID) | ORAL | Status: DC
  Administered 2012-08-01 – 2012-08-03 (×10): 600 mg via ORAL
  Filled 2012-07-31 (×10): qty 1

## 2012-07-31 MED ORDER — BUPIVACAINE HCL (PF) 0.25 % IJ SOLN
INTRAMUSCULAR | Status: AC
  Filled 2012-07-31: qty 30

## 2012-07-31 MED ORDER — KETOROLAC TROMETHAMINE 60 MG/2ML IM SOLN: 60.0000 mg | Freq: Once | INTRAMUSCULAR | Status: AC | PRN

## 2012-07-31 MED ORDER — WITCH HAZEL-GLYCERIN EX PADS: 1.0000 "application " | MEDICATED_PAD | CUTANEOUS | Status: DC | PRN

## 2012-07-31 MED ORDER — MORPHINE SULFATE 0.5 MG/ML IJ SOLN
INTRAMUSCULAR | Status: AC
  Filled 2012-07-31: qty 10

## 2012-07-31 MED ORDER — LACTATED RINGERS IV SOLN
INTRAVENOUS | Status: DC | PRN
  Administered 2012-07-31 (×3): via INTRAVENOUS

## 2012-07-31 MED ORDER — PHENYLEPHRINE HCL 10 MG/ML IJ SOLN
INTRAMUSCULAR | Status: DC | PRN
  Administered 2012-07-31: 80 ug via INTRAVENOUS
  Administered 2012-07-31: 40 ug via INTRAVENOUS
  Administered 2012-07-31 (×3): 80 ug via INTRAVENOUS
  Administered 2012-07-31: 40 ug via INTRAVENOUS

## 2012-07-31 MED ORDER — MEPERIDINE HCL 25 MG/ML IJ SOLN: 6.2500 mg | INTRAMUSCULAR | Status: DC | PRN

## 2012-07-31 MED ORDER — NALOXONE HCL 0.4 MG/ML IJ SOLN: 0.4000 mg | INTRAMUSCULAR | Status: DC | PRN

## 2012-07-31 MED ORDER — KETOROLAC TROMETHAMINE 30 MG/ML IJ SOLN: 15.0000 mg | Freq: Once | INTRAMUSCULAR | Status: DC | PRN

## 2012-07-31 MED ORDER — EPHEDRINE 5 MG/ML INJ
INTRAVENOUS | Status: AC
  Filled 2012-07-31: qty 10

## 2012-07-31 MED ORDER — PHENYLEPHRINE 40 MCG/ML (10ML) SYRINGE FOR IV PUSH (FOR BLOOD PRESSURE SUPPORT)
PREFILLED_SYRINGE | INTRAVENOUS | Status: AC
  Filled 2012-07-31: qty 5

## 2012-07-31 MED ORDER — METHYLERGONOVINE MALEATE 0.2 MG/ML IJ SOLN: 0.2000 mg | INTRAMUSCULAR | Status: DC | PRN

## 2012-07-31 MED ORDER — FENTANYL CITRATE 0.05 MG/ML IJ SOLN
INTRAMUSCULAR | Status: AC
  Filled 2012-07-31: qty 2

## 2012-07-31 MED ORDER — DIPHENHYDRAMINE HCL 25 MG PO CAPS: 25.0000 mg | ORAL_CAPSULE | Freq: Four times a day (QID) | ORAL | Status: DC | PRN

## 2012-07-31 MED ORDER — SCOPOLAMINE 1 MG/3DAYS TD PT72: 1.0000 | MEDICATED_PATCH | Freq: Once | TRANSDERMAL | Status: DC

## 2012-07-31 MED ORDER — LACTATED RINGERS IV SOLN
INTRAVENOUS | Status: DC | PRN
  Administered 2012-07-31: 13:00:00 via INTRAVENOUS

## 2012-07-31 MED ORDER — PRENATAL MULTIVITAMIN CH
1.0000 | ORAL_TABLET | Freq: Every day | ORAL | Status: DC
  Administered 2012-08-01: 1 via ORAL
  Filled 2012-07-31 (×3): qty 1

## 2012-07-31 MED ORDER — ZOLPIDEM TARTRATE 5 MG PO TABS: 5.0000 mg | ORAL_TABLET | Freq: Every evening | ORAL | Status: DC | PRN

## 2012-07-31 MED ORDER — ONDANSETRON HCL 4 MG/2ML IJ SOLN: 4.0000 mg | Freq: Three times a day (TID) | INTRAMUSCULAR | Status: DC | PRN

## 2012-07-31 MED ORDER — SIMETHICONE 80 MG PO CHEW: 80.0000 mg | CHEWABLE_TABLET | ORAL | Status: DC | PRN

## 2012-07-31 MED ORDER — TETANUS-DIPHTH-ACELL PERTUSSIS 5-2.5-18.5 LF-MCG/0.5 IM SUSP: 0.5000 mL | Freq: Once | INTRAMUSCULAR | Status: DC

## 2012-07-31 MED ORDER — OXYTOCIN 10 UNIT/ML IJ SOLN
INTRAMUSCULAR | Status: AC
  Filled 2012-07-31: qty 4

## 2012-07-31 MED ORDER — KETOROLAC TROMETHAMINE 60 MG/2ML IM SOLN
INTRAMUSCULAR | Status: AC
  Administered 2012-07-31: 60 mg via INTRAMUSCULAR
  Filled 2012-07-31: qty 2

## 2012-07-31 MED ORDER — DIPHENHYDRAMINE HCL 50 MG/ML IJ SOLN: 25.0000 mg | INTRAMUSCULAR | Status: DC | PRN

## 2012-07-31 MED ORDER — ONDANSETRON HCL 4 MG PO TABS
4.0000 mg | ORAL_TABLET | ORAL | Status: DC | PRN
  Administered 2012-08-03: 4 mg via ORAL
  Filled 2012-07-31: qty 1

## 2012-07-31 MED ORDER — LACTATED RINGERS IV SOLN
INTRAVENOUS | Status: DC
  Administered 2012-08-01 (×2): via INTRAVENOUS

## 2012-07-31 MED ORDER — FENTANYL CITRATE 0.05 MG/ML IJ SOLN: 25.0000 ug | INTRAMUSCULAR | Status: DC | PRN

## 2012-07-31 MED ORDER — CEFAZOLIN SODIUM-DEXTROSE 2-3 GM-% IV SOLR
INTRAVENOUS | Status: AC
  Filled 2012-07-31: qty 50

## 2012-07-31 MED ORDER — MENTHOL 3 MG MT LOZG: 1.0000 | LOZENGE | OROMUCOSAL | Status: DC | PRN

## 2012-07-31 MED ORDER — DIPHENHYDRAMINE HCL 25 MG PO CAPS: 25.0000 mg | ORAL_CAPSULE | ORAL | Status: DC | PRN

## 2012-07-31 MED ORDER — METOCLOPRAMIDE HCL 5 MG/ML IJ SOLN
INTRAMUSCULAR | Status: DC | PRN
  Administered 2012-07-31 (×2): 5 mg via INTRAVENOUS

## 2012-07-31 MED ORDER — ONDANSETRON HCL 4 MG/2ML IJ SOLN: 4.0000 mg | INTRAMUSCULAR | Status: DC | PRN

## 2012-07-31 MED ORDER — NALBUPHINE HCL 10 MG/ML IJ SOLN: 5.0000 mg | INTRAMUSCULAR | Status: DC | PRN

## 2012-07-31 MED ORDER — DIBUCAINE 1 % RE OINT: 1.0000 "application " | TOPICAL_OINTMENT | RECTAL | Status: DC | PRN

## 2012-07-31 MED ORDER — ONDANSETRON HCL 4 MG/2ML IJ SOLN
INTRAMUSCULAR | Status: DC | PRN
  Administered 2012-07-31: 4 mg via INTRAVENOUS

## 2012-07-31 MED ORDER — SENNOSIDES-DOCUSATE SODIUM 8.6-50 MG PO TABS
2.0000 | ORAL_TABLET | Freq: Every day | ORAL | Status: DC
  Administered 2012-07-31 – 2012-08-02 (×3): 2 via ORAL

## 2012-07-31 MED ORDER — METOCLOPRAMIDE HCL 5 MG/ML IJ SOLN: 10.0000 mg | Freq: Three times a day (TID) | INTRAMUSCULAR | Status: DC | PRN

## 2012-07-31 MED ORDER — OXYTOCIN 40 UNITS IN LACTATED RINGERS INFUSION - SIMPLE MED: 62.5000 mL/h | INTRAVENOUS | Status: AC

## 2012-07-31 MED ORDER — METOCLOPRAMIDE HCL 5 MG/ML IJ SOLN
INTRAMUSCULAR | Status: AC
  Filled 2012-07-31: qty 2

## 2012-07-31 MED ORDER — BUPIVACAINE IN DEXTROSE 0.75-8.25 % IT SOLN
INTRATHECAL | Status: DC | PRN
  Administered 2012-07-31: 1.4 mL via INTRATHECAL

## 2012-07-31 MED ORDER — OXYTOCIN 40 UNITS IN LACTATED RINGERS INFUSION - SIMPLE MED
INTRAVENOUS | Status: DC | PRN
  Administered 2012-07-31: 40 [IU] via INTRAVENOUS

## 2012-07-31 MED ORDER — SIMETHICONE 80 MG PO CHEW
80.0000 mg | CHEWABLE_TABLET | Freq: Three times a day (TID) | ORAL | Status: DC
  Administered 2012-08-01 – 2012-08-03 (×9): 80 mg via ORAL

## 2012-07-31 SURGICAL SUPPLY — 30 items
CLOTH BEACON ORANGE TIMEOUT ST (SAFETY) ×2 IMPLANT
CONTAINER PREFILL 10% NBF 15ML (MISCELLANEOUS) IMPLANT
DRAPE LG THREE QUARTER DISP (DRAPES) ×2 IMPLANT
DRSG OPSITE POSTOP 4X10 (GAUZE/BANDAGES/DRESSINGS) ×2 IMPLANT
DURAPREP 26ML APPLICATOR (WOUND CARE) ×2 IMPLANT
ELECT REM PT RETURN 9FT ADLT (ELECTROSURGICAL) ×2
ELECTRODE REM PT RTRN 9FT ADLT (ELECTROSURGICAL) ×1 IMPLANT
EXTRACTOR VACUUM M CUP 4 TUBE (SUCTIONS) IMPLANT
GLOVE BIO SURGEON STRL SZ7.5 (GLOVE) ×2 IMPLANT
GOWN PREVENTION PLUS XLARGE (GOWN DISPOSABLE) ×2 IMPLANT
GOWN STRL REIN XL XLG (GOWN DISPOSABLE) ×4 IMPLANT
KIT ABG SYR 3ML LUER SLIP (SYRINGE) IMPLANT
NDL HYPO 25X1 1.5 SAFETY (NEEDLE) ×1 IMPLANT
NDL HYPO 25X5/8 SAFETYGLIDE (NEEDLE) IMPLANT
NEEDLE HYPO 25X1 1.5 SAFETY (NEEDLE) ×2 IMPLANT
NEEDLE HYPO 25X5/8 SAFETYGLIDE (NEEDLE) IMPLANT
NS IRRIG 1000ML POUR BTL (IV SOLUTION) ×2 IMPLANT
PACK C SECTION WH (CUSTOM PROCEDURE TRAY) ×2 IMPLANT
SLEEVE SCD COMPRESS KNEE MED (MISCELLANEOUS) IMPLANT
STAPLER VISISTAT 35W (STAPLE) ×2 IMPLANT
SUT MNCRL 0 VIOLET CTX 36 (SUTURE) ×2 IMPLANT
SUT MON AB 2-0 CT1 27 (SUTURE) ×2 IMPLANT
SUT MON AB-0 CT1 36 (SUTURE) ×4 IMPLANT
SUT MONOCRYL 0 CTX 36 (SUTURE) ×2
SUT PLAIN 0 NONE (SUTURE) IMPLANT
SUT PLAIN 2 0 XLH (SUTURE) IMPLANT
SYR CONTROL 10ML LL (SYRINGE) ×2 IMPLANT
TOWEL OR 17X24 6PK STRL BLUE (TOWEL DISPOSABLE) ×6 IMPLANT
TRAY FOLEY CATH 14FR (SET/KITS/TRAYS/PACK) ×2 IMPLANT
WATER STERILE IRR 1000ML POUR (IV SOLUTION) ×2 IMPLANT

## 2012-07-31 NOTE — Op Note (Signed)
Cesarean Section Procedure Note  Indications: Symptomatic symphysis diathesis with disability and mature Amnio  Pre-operative Diagnosis: 37 week 3 day pregnancy.  Post-operative Diagnosis: same  Surgeon: Lenoard Aden   Assistants: Fredric Mare  Anesthesia: Local anesthesia 0.25.% bupivacaine and Spinal anesthesia  ASA Class: 2  Procedure Details  The patient was seen in the Holding Room. The risks, benefits, complications, treatment options, and expected outcomes were discussed with the patient.  The patient concurred with the proposed plan, giving informed consent. The risks of anesthesia, infection, bleeding and possible injury to other organs discussed. Injury to bowel, bladder, or ureter with possible need for repair discussed. Possible need for transfusion with secondary risks of hepatitis or HIV acquisition discussed. Post operative complications to include but not limited to DVT, PE and Pneumonia noted. The site of surgery properly noted/marked. The patient was taken to Operating Room # 9, identified as [employer] and the procedure verified as C-Section Delivery. A Time Out was held and the above information confirmed.  After induction of anesthesia, the patient was draped and prepped in the usual sterile manner. A Pfannenstiel incision was made and carried down through the subcutaneous tissue to the fascia. Fascial incision was made and extended transversely using Mayo scissors. The fascia was separated from the underlying rectus tissue superiorly and inferiorly. The peritoneum was identified and entered. Peritoneal incision was extended longitudinally. The utero-vesical peritoneal reflection was incised transversely and the bladder flap was bluntly freed from the lower uterine segment. A low transverse uterine incision(Kerr hysterotomy) was made. Delivered from OA presentation was a  female with Apgar scores of 9 at one minute and 9 at five minutes. Bulb suctioning gently performed.  Neonatal team in attendance.After the umbilical cord was clamped and cut cord blood was obtained for evaluation. The placenta was removed intact and appeared normal. The uterus was curetted with a dry lap pack. Good hemostasis was noted.The uterine outline, tubes and ovaries appeared normal. The uterine incision was closed with running locked sutures of 0 Monocryl x 2 layers. Hemostasis was observed. The parietal peritoneum was closed with a running 2-0 Monocryl suture. The fascia was then reapproximated with running sutures of 0 Monocryl. [place] Border tissue closed with 2-0 plain suture. The skin was reapproximated with staples.  Instrument, sponge, and needle counts were correct prior the abdominal closure and at the conclusion of the case.   Findings: above  Estimated Blood Loss:  500         Drains: foley                 Specimens: placenta to L&D                 Complications:  None; patient tolerated the procedure well.         Disposition: PACU - hemodynamically stable.         Condition: stable  Attending Attestation: I performed the procedure.

## 2012-07-31 NOTE — Anesthesia Postprocedure Evaluation (Signed)
Anesthesia Post Note  Patient: Victoria Wagner  Procedure(s) Performed: Procedure(s) (LRB): CESAREAN SECTION (N/A)  Anesthesia type: Spinal  Patient location: PACU  Post pain: Pain level controlled  Post assessment: Post-op Vital signs reviewed  Last Vitals:  Filed Vitals:   07/31/12 1400  BP: 133/76  Pulse: 96  Temp:   Resp: 24    Post vital signs: Reviewed  Level of consciousness: awake  Complications: No apparent anesthesia complications

## 2012-07-31 NOTE — H&P (Signed)
NAME:  Victoria Wagner, Victoria Wagner                ACCOUNT NO.:  [id number]  MEDICAL RECORD NO.:  [id number]  LOCATION:                                 FACILITY:  PHYSICIAN:  Lenoard Aden, M.D.DATE OF BIRTH:  Feb 26, 1981  DATE OF ADMISSION:  07/31/2012 DATE OF DISCHARGE:                             HISTORY & PHYSICAL   CHIEF COMPLAINT:  Intractable symphysis diastasis with secondary disability, wheelchair-bound on chronic pain medication with mature amniocentesis for primary C-section at 37 weeks and 2 days.  She is a 32 year old African American female, G2, P0, with aforementioned indications which had been discussed with Dr. Sherrie George of Maternal Fetal Medicine for primary C-section due to mature amnio.  The patient has had an intractable symphysis diastasis which has become progressively over the course of her pregnancy.  She is on chronic Vicodin use for this pain.  She is wheelchair bound out of work and the pain has been refractory to physical therapy.  Orthopedic consultation aroused concerns for possibility of long-term damage from vaginal delivery.  Therefore, the patient is scheduled for a primary C-section with known lung maturity at 37-1/2 weeks.  MEDICATIONS:  Include Vicodin and Phenergan and prenatal vitamins.  SOCIAL HISTORY:  She is a nonsmoker, nondrinker.  She denies domestic or physical violence.  She had previous obstetric history for an ectopic pregnancy with diagnostic laparoscopy and right salpingo-oophorectomy in 2007.  She has a history of known uterine fibroids.  No other medical or surgical history.  FAMILY HISTORY:  Remarkable for chronic hypertension.  Prenatal course as noted.  PHYSICAL EXAMINATION:  GENERAL:  She is a well-developed, well- nourished, black female, no acute distress. HEENT:  Normal. LUNGS:  Clear to auscultation. HEART:  Regular rhythm. ABDOMEN:  Soft, gravid, nontender.  Estimated fetal weight 7.5 pounds. Cervix is closed, 60%, vertex,  out of the pelvis. EXTREMITIES:  There are no cords. NEUROLOGIC:  Nonfocal. SKIN:  Intact.  IMPRESSION: 1. A 37-week intrauterine pregnancy. 2. Symptomatic intractable symphysis diastasis with chronic pain and     disability with mature amniocentesis, and orthopedic concerns for     vaginal delivery.  PLAN:  Proceed with primary low segment transverse cesarean section. Risks of anesthesia, infection, bleeding, abdominal organs, need for repair, delayed versus immediate complications to include bowel and bladder injury noted, small risk of prematurity has been discussed with the patient.  The patient acknowledges and wishes to proceed.     Lenoard Aden, M.D.     RJT/MEDQ  D:  07/30/2012  T:  07/30/2012  Job:  [id number]

## 2012-07-31 NOTE — Anesthesia Preprocedure Evaluation (Signed)
Anesthesia Evaluation  Patient identified by MRN, date of birth, ID band Patient awake    Reviewed: Allergy & Precautions, H&P , NPO status , Patient's Chart, lab work & pertinent test results  Airway Mallampati: II TM Distance: >3 FB Neck ROM: full    Dental no notable dental hx.    Pulmonary neg pulmonary ROS,  breath sounds clear to auscultation  Pulmonary exam normal       Cardiovascular negative cardio ROS      Neuro/Psych negative neurological ROS  negative psych ROS   GI/Hepatic negative GI ROS, Neg liver ROS,   Endo/Other  Morbid obesity  Renal/GU negative Renal ROS  negative genitourinary   Musculoskeletal negative musculoskeletal ROS (+)   Abdominal (+) + obese,   Peds negative pediatric ROS (+)  Hematology negative hematology ROS (+)   Anesthesia Other Findings   Reproductive/Obstetrics (+) Pregnancy                           Anesthesia Physical Anesthesia Plan  ASA: III  Anesthesia Plan: Spinal   Post-op Pain Management:    Induction:   Airway Management Planned:   Additional Equipment:   Intra-op Plan:   Post-operative Plan:   Informed Consent: I have reviewed the patients History and Physical, chart, labs and discussed the procedure including the risks, benefits and alternatives for the proposed anesthesia with the patient or authorized representative who has indicated his/her understanding and acceptance.     Plan Discussed with: CRNA and Surgeon  Anesthesia Plan Comments:         Anesthesia Quick Evaluation

## 2012-07-31 NOTE — Transfer of Care (Signed)
Immediate Anesthesia Transfer of Care Note  Patient: Victoria Wagner  Procedure(s) Performed: Procedure(s) with comments: CESAREAN SECTION (N/A) -  EDD: 08/18/12  Patient Location: PACU  Anesthesia Type:Spinal  Level of Consciousness: awake, alert  and oriented  Airway & Oxygen Therapy: Patient Spontanous Breathing  Post-op Assessment: Report given to PACU RN and Post -op Vital signs reviewed and stable  Post vital signs: Reviewed and stable  Complications: No apparent anesthesia complications

## 2012-07-31 NOTE — Anesthesia Procedure Notes (Signed)
Spinal  Patient location during procedure: OR Start time: 07/31/2012 12:55 PM End time: 07/31/2012 1:01 PM Staffing Anesthesiologist: Sandrea Hughs Performed by: anesthesiologist  Preanesthetic Checklist Completed: patient identified, site marked, surgical consent, pre-op evaluation, timeout performed, IV checked, risks and benefits discussed and monitors and equipment checked Spinal Block Patient position: sitting Prep: DuraPrep Patient monitoring: heart rate, cardiac monitor, continuous pulse ox and blood pressure Approach: midline Location: L3-4 Injection technique: single-shot Needle Needle type: Sprotte  Needle gauge: 24 G Needle length: 9 cm Needle insertion depth: 7 cm Assessment Sensory level: T6

## 2012-07-31 NOTE — Lactation Note (Signed)
This note was copied from the chart of [name]. Lactation Consultation Note Initial consultation with this first time mom in PACU. Mom is holding baby STS, baby sound asleep, mom is also very sleepy. Teaching is limited at this time due to mom's fatigue. Mom and FOB deny questions at this time. Briefly discussed br feeding basics: feeding cues, STS, hand expression. Mom will likely need reinforcement. FOB at bedside and supportive.  Lactation brochure provided, mom and dad made aware of lactation services. Enc to call for help if needed.   Patient Name: [name] Today's Date: 07/31/2012 Reason for consult: Initial assessment   Maternal Data Has patient been taught Hand Expression?:  (discussed; will need review) Does the patient have breastfeeding experience prior to this delivery?: No  Feeding Feeding Type: Breast Milk Feeding method: Breast Length of feed: 0 min  LATCH Score/Interventions Latch: Too sleepy or reluctant, no latch achieved, no sucking elicited. Intervention(s): Skin to skin  Audible Swallowing: None Intervention(s): Skin to skin  Type of Nipple: Everted at rest and after stimulation  Comfort (Breast/Nipple): Soft / non-tender     Hold (Positioning): Assistance needed to correctly position infant at breast and maintain latch.  LATCH Score: 5  Lactation Tools Discussed/Used     Consult Status Consult Status: Follow-up Follow-up type: In-patient    Octavio Manns [facility] 07/31/2012, 2:24 PM

## 2012-07-31 NOTE — Progress Notes (Signed)
Patient ID: Victoria Wagner, female   DOB: 1980/05/10, 32 y.o.   MRN: [id number] Patient seen and examined. Consent witnessed and signed. No changes noted. Update completed. H&P dictated 4/8.

## 2012-08-01 ENCOUNTER — Encounter (HOSPITAL_COMMUNITY): Payer: Self-pay | Admitting: Obstetrics and Gynecology

## 2012-08-01 DIAGNOSIS — Z98891 History of uterine scar from previous surgery: Secondary | ICD-10-CM

## 2012-08-01 HISTORY — DX: History of uterine scar from previous surgery: Z98.891

## 2012-08-01 LAB — CBC
HCT: 32.9 % — ABNORMAL LOW (ref 36.0–46.0)
Hemoglobin: 10.6 g/dL — ABNORMAL LOW (ref 12.0–15.0)
MCH: 27.5 pg (ref 26.0–34.0)
MCHC: 32.2 g/dL (ref 30.0–36.0)
MCV: 85.2 fL (ref 78.0–100.0)
Platelets: 182 10*3/uL (ref 150–400)
RBC: 3.86 MIL/uL — ABNORMAL LOW (ref 3.87–5.11)
RDW: 16.6 % — ABNORMAL HIGH (ref 11.5–15.5)
WBC: 10.3 10*3/uL (ref 4.0–10.5)

## 2012-08-01 LAB — BIRTH TISSUE RECOVERY COLLECTION (PLACENTA DONATION)

## 2012-08-01 NOTE — Anesthesia Postprocedure Evaluation (Signed)
  Anesthesia Post-op Note  Patient: Victoria Wagner  Procedure(s) Performed: Procedure(s) with comments: CESAREAN SECTION (N/A) -  EDD: 08/18/12  Patient Location: PACU and Mother/Baby  Anesthesia Type:Spinal  Level of Consciousness: awake, alert , oriented and patient cooperative  Airway and Oxygen Therapy: Patient Spontanous Breathing  Post-op Pain: none  Post-op Assessment: Post-op Vital signs reviewed, Patient's Cardiovascular Status Stable and Respiratory Function Stable  Post-op Vital Signs: Reviewed and stable  Complications: No apparent anesthesia complications

## 2012-08-01 NOTE — Progress Notes (Signed)
Patient ID: Victoria Wagner, female   DOB: 05/03/1980, 32 y.o.   MRN: [id number] POD # 1  Subjective: Pt reports feeling  "ok", tired, sore/ Pain controlled with ibuprofen and percocet Tolerating po/ Foley d/c'ed and pt has successfully voided x 1.  Ambulated to BR with assist.  "feels ok to walk" No n/v/Flatus neg Activity: up with assistance Bleeding is light Newborn info:  Information for the patient's newborn:  [place], Girl Shakiya [[id number]]  female Feeding: breast   Objective: VS: Blood pressure 112/73, pulse 66, temperature 98 F (36.7 C), temperature source Oral, resp. rate 18.    Intake/Output Summary (Last 24 hours) at 08/01/12 1052 Last data filed at 08/01/12 1030  Gross per 24 hour  Intake   3420 ml  Output   1650 ml  Net   1770 ml      Recent Labs  07/31/12 1200 08/01/12 0610  WBC 9.6 10.3  HGB 12.9 10.6*  HCT 39.8 32.9*  PLT 210 182    Blood type: --/--/O POS, O POS (04/09 1200) Rubella: Immune   Physical Exam:  General: alert, cooperative and no distress CV: Regular rate and rhythm Resp: clear Abdomen: soft, nontender, normal bowel sounds Incision: covered with tegaderm and honeycomb dressing; some old dry bloody drainage noted. Uterine Fundus: firm, below umbilicus, nontender Lochia: minimal Ext: +1 pedal edema; no calf pain; SCD's in place    A/P: POD # 1/ G2P1011 S/P Primary C/Section @ 37wks d/t Intractible Symphisis Diastasis Doing well Continue routine post op orders and advance ambulation and mobility today in room as tolerated   Signed: Demetrius Revel, MSN, [facility] 08/01/2012, 10:52 AM

## 2012-08-02 NOTE — Progress Notes (Signed)
Patient ID: Victoria Wagner, female   DOB: Nov 11, 1980, 32 y.o.   MRN: [id number] POD # 2  Subjective: Pt reports feeling "a little better"  Feels pain r/t diastasis is improved, but experiencing more incision pain.  Pt feels more stable ambulating, not "shuffling" as she did pre delivery Pain controlled with ibuprofen and percocet Tolerating po/Voiding without problems/ No n/v/Flatus po Activity: up with assistance Bleeding is light  Feeding: breast   Objective: VS: Blood pressure 113/81, pulse 94, temperature 98.1 F (36.7 C), temperature source Oral, resp. rate 18.    Physical Exam:  General: alert, cooperative and no distress CV: Regular rate and rhythm Resp: clear Abdomen: soft, appropriately tender, normal bowel sounds Uterine Fundus: firm, below umbilicus, NT Incision: covered with tegaderm and honeycomb dressing Lochia: minimal Ext: edema trace to +1 and Homans sign is negative, no sign of DVT    A/P: POD # 2/ G2P1011 S/P Primary C/Section at 37wks d/t Intractible symphysis diastasis Doing well Increase ambulation today, into hall and up to shower.  Continue routine post op orders Anticipate discharge home in the am   Signed: Demetrius Revel, MSN, [facility] 08/02/2012, 9:51 AM

## 2012-08-03 MED ORDER — IBUPROFEN 600 MG PO TABS: 600.0000 mg | ORAL_TABLET | Freq: Four times a day (QID) | ORAL | Status: DC

## 2012-08-03 MED ORDER — OXYCODONE-ACETAMINOPHEN 5-325 MG PO TABS: 1.0000 | ORAL_TABLET | ORAL | Status: DC | PRN

## 2012-08-03 NOTE — Progress Notes (Addendum)
Patient ID: Victoria Wagner, female   DOB: 1980-11-10, 32 y.o.   MRN: [id number] POD # 3, C/s at 37 wks due to severe pubic symphysis diastasis  Subjective: PS diastasis, Moving with pain but better since delivery. Not sure if she can climb stairs.  Pain controlled with ibuprofen and percocet Eating well, no n/v. No incision concerns.  Feeding: breast  Objective: BP 123/71  Pulse 82  Temp(Src) 98.3 F (36.8 C) (Oral)  Resp 18  Ht 5\' 6"  (1.676 m)  Wt 247 lb (112.038 kg)  BMI 39.89 kg/m2  SpO2 97%  LMP 11/19/2011   Physical Exam:  General: alert, cooperative and no distress CV: Regular rate and rhythm Resp: clear Abdomen: soft, fundus firm, below umb. normal bowel sounds Incision: covered with honeycomb dressing. No erythema noted. Staples intact Lochia: minimal Ext: edema trace to +1 and Homans sign is negative, no sign of DVT   A/P: POD # 3/ G2P1011; Primary C/Section at 37wks d/t Intractible symphysis diastasis Doing well. Discharge home. TDaP reviewed and recommended, patient will inform RN if desired.  PP care reviewed, pain mngmt reviewed. PS diastasis care reviewed. Rest more until healed, shuffle, don't climb stairs if possible for 2 wks or limit to 1-2 times per day. Call office if worse.  Post-op care reviewed. Staple removal D5 due to obesity (in office on 4/14) Slight anemia, continue PNVit. Rub Imm, O(+).   Signed: Arnetha Silverthorne R,MD  08/03/2012, 10:46 AM

## 2012-08-03 NOTE — Discharge Summary (Signed)
Obstetric Discharge Summary Reason for Admission: Cesarean section Intractable symphysis diastasis with secondary disability, wheelchair-bound on chronic pain medication with mature amniocentesis for primary C-section at 37 weeks and 2 days.  Postpartum Procedures: none Complications-Operative and Postpartum: none Hemoglobin  Date Value Range Status  08/01/2012 10.6* 12.0 - 15.0 g/dL Final     DELTA CHECK NOTED     REPEATED TO VERIFY  10/24/2011 12.4  12.2 - 16.2 g/dL Final     HCT  Date Value Range Status  08/01/2012 32.9* 36.0 - 46.0 % Final     HCT, POC  Date Value Range Status  10/24/2011 42.2  37.7 - 47.9 % Final    Physical Exam:  General: alert, ambulating with assistance.  Lochia: appropriate Uterine Fundus: firm Incision: healing well DVT Evaluation: No evidence of DVT seen on physical exam.  Discharge Diagnoses: Term Pregnancy-delivered  Discharge Information: Date: 08/03/2012 Activity: pelvic rest and lifting restrictions Diet: routine Medications: PNV, Tylenol #3, Ibuprofen and Colace Condition: stable Instructions: refer to practice specific booklet Discharge to: home Follow-up Information   Follow up with Lenoard Aden, MD.   Contact information:   [address] [place] [address] [phone]       Follow up with Lenoard Aden, MD In 6 weeks. (Office visit on 08/05/12 for staple removal)    Contact information:   [address] [place] [address] [phone]       Newborn Data: Live born female  Birth Weight: 6 lb 15.8 oz (3170 g) APGAR: 6, 9  Home with mother.  Lavonn Maxcy R 08/03/2012, 10:58 AM

## 2012-08-07 ENCOUNTER — Ambulatory Visit (HOSPITAL_COMMUNITY): Payer: BC Managed Care – PPO

## 2012-08-08 ENCOUNTER — Ambulatory Visit (HOSPITAL_COMMUNITY): Payer: BC Managed Care – PPO

## 2012-10-01 ENCOUNTER — Emergency Department (INDEPENDENT_AMBULATORY_CARE_PROVIDER_SITE_OTHER)
Admission: EM | Admit: 2012-10-01 | Discharge: 2012-10-01 | Disposition: A | Discharge status: Home / Self Care | Payer: BC Managed Care – PPO | Source: Home / Self Care | Attending: Emergency Medicine | Admitting: Emergency Medicine

## 2012-10-01 ENCOUNTER — Encounter (HOSPITAL_COMMUNITY): Payer: Self-pay | Admitting: *Deleted

## 2012-10-01 DIAGNOSIS — Z872 Personal history of diseases of the skin and subcutaneous tissue: Secondary | ICD-10-CM

## 2012-10-01 DIAGNOSIS — T50904A Poisoning by unspecified drugs, medicaments and biological substances, undetermined, initial encounter: Secondary | ICD-10-CM

## 2012-10-01 DIAGNOSIS — L270 Generalized skin eruption due to drugs and medicaments taken internally: Secondary | ICD-10-CM

## 2012-10-01 MED ORDER — TRIAMCINOLONE ACETONIDE 0.1 % EX CREA: TOPICAL_CREAM | Freq: Two times a day (BID) | CUTANEOUS | Status: DC

## 2012-10-01 NOTE — ED Provider Notes (Signed)
History     CSN: [id number]  Arrival date & time 10/01/12  1202   First MD Initiated Contact with Patient 10/01/12 1237      Chief Complaint  Patient presents with  . Rash    (Consider location/radiation/quality/duration/timing/severity/associated sxs/prior treatment) HPI Comments: Patient presents urgent care complaining of irritation to a keloid that she has in her neck. She has been applying a vitamin D cream and has slowly developed a rash and irritation around and on top of her keloid it is tender at touch. Denies any fevers. She is in the process: [employer] to have the skilled remove surgically.  Patient is a 32 y.o. female presenting with rash. The history is provided by the patient.  Rash Pain quality: aching   Pain severity:  Mild Onset quality:  Sudden Timing:  Constant Context: not alcohol use   Relieved by:  Nothing Worsened by:  Nothing tried Ineffective treatments:  None tried Associated symptoms: no anorexia, no chills, no fever, no nausea and no shortness of breath   Risk factors: no alcohol abuse     Past Medical History  Diagnosis Date  . No pertinent past medical history   . S/P primary low transverse C-section (07/31/12) 08/01/2012  . Postpartum care following cesarean delivery (07/31/12) 08/01/2012    Past Surgical History  Procedure Laterality Date  . Cervical fusion    . Ectopic pregnancy surgery    . Cesarean section N/A 07/31/2012    Procedure: CESAREAN SECTION;  Surgeon: Lenoard Aden, MD;  Location: [facility];  Service: Obstetrics;  Laterality: N/A;   EDD: 08/18/12    No family history on file.  History  Substance Use Topics  . Smoking status: Never Smoker   . Smokeless tobacco: Not on file  . Alcohol Use: No    OB History   Grav Para Term Preterm Abortions TAB SAB Ect Mult Living   2 1 1  1   1  1       Review of Systems  Constitutional: Negative for fever, chills, activity change and appetite change.  Respiratory: Negative for  shortness of breath.   Gastrointestinal: Negative for nausea and anorexia.  Skin: Positive for rash. Negative for color change, pallor and wound.    Allergies  Review of patient's allergies indicates no known allergies.  Home Medications   Current Outpatient Rx  Name  Route  Sig  Dispense  Refill  . flintstones complete (FLINTSTONES) 60 MG chewable tablet   Oral   Chew 1 tablet by mouth 2 (two) times daily.         Marland Kitchen ibuprofen (ADVIL,MOTRIN) 600 MG tablet   Oral   Take 1 tablet (600 mg total) by mouth every 6 (six) hours.   40 tablet   1   . oxyCODONE-acetaminophen (PERCOCET/ROXICET) 5-325 MG per tablet   Oral   Take 1-2 tablets by mouth every 4 (four) hours as needed.   30 tablet   0   . triamcinolone cream (KENALOG) 0.1 %   Topical   Apply topically 2 (two) times daily. Apply bid x 2 weeks to affected area.   30 g   0     BP 121/72  Pulse 95  Temp(Src) 98.1 F (36.7 C) (Oral)  Resp 16  SpO2 99%  Physical Exam  Nursing note and vitals reviewed. Constitutional: She appears well-developed and well-nourished.  HENT:  Mouth/Throat: No oropharyngeal exudate.  Eyes: Conjunctivae are normal.  Neck: Neck supple. No JVD present.  Lymphadenopathy:    She has no cervical adenopathy.  Neurological: She is alert.  Skin: No rash noted. There is erythema.       ED Course  Procedures (including critical care time)  Labs Reviewed - No data to display No results found.   1. Hx of keloid of skin   2. Dermatitis, drug-induced       MDM  Keloid- with a localized reaction induced by most likely by Vitamin-E Rx-  triamcinolone cream. Instructed patient about symptoms that should require of further evaluation her return. We discussed signs and symptoms of a secondary infection such as increased swelling redness warmth and tenderness it does not respond to local steroid she agrees and understands treatment plan and will follow-up as needed        Jimmie Molly, MD 10/01/12 1345

## 2012-10-01 NOTE — ED Notes (Signed)
Pt  Reports  She  Applied  Vit  E  Cream  To  Body  And  Developed  A  Rash on neck    Symptoms   X  3  Days   Pt  Displays  No  Angio  Edema  And  Is  In no  Distress

## 2012-11-09 ENCOUNTER — Encounter (HOSPITAL_COMMUNITY): Payer: Self-pay | Admitting: Emergency Medicine

## 2012-11-09 ENCOUNTER — Emergency Department (INDEPENDENT_AMBULATORY_CARE_PROVIDER_SITE_OTHER)
Admission: EM | Admit: 2012-11-09 | Discharge: 2012-11-09 | Disposition: A | Discharge status: Home / Self Care | Payer: BC Managed Care – PPO | Source: Home / Self Care

## 2012-11-09 DIAGNOSIS — J869 Pyothorax without fistula: Secondary | ICD-10-CM

## 2012-11-09 HISTORY — DX: Pain, unspecified: R52

## 2012-11-09 MED ORDER — DOXYCYCLINE HYCLATE 100 MG PO CAPS: 100.0000 mg | ORAL_CAPSULE | Freq: Two times a day (BID) | ORAL | Status: DC

## 2012-11-09 MED ORDER — HYDROCODONE-ACETAMINOPHEN 5-325 MG PO TABS: 1.0000 | ORAL_TABLET | Freq: Four times a day (QID) | ORAL | Status: DC | PRN

## 2012-11-09 NOTE — ED Notes (Signed)
Reports "mass" to right chest/breast.  History of having abscess/boils under arms, but never in this location, noticed 5 days ago

## 2012-11-09 NOTE — ED Notes (Signed)
At bedside for physician exam.  

## 2012-11-09 NOTE — ED Provider Notes (Signed)
History    CSN: [id number] Arrival date & time 11/09/12  1533  First MD Initiated Contact with Patient 11/09/12 1633     Chief Complaint  Patient presents with  . Abscess   (Consider location/radiation/quality/duration/timing/severity/associated sxs/prior Treatment) HPI   32 yo bf presents today with abscess under medial right breast.  This has been getting bigger and more painful x 5 days.  Thinks that this may have been irritated by her bra.  Just had a baby 3 months ago.  No breastfeeding.  Denies fever, chills, fatigue, nausea, vomiting, nipple drainage.  Has a hx or axillary abscesses that required drainage.    Past Medical History  Diagnosis Date  . No pertinent past medical history   . S/P primary low transverse C-section (07/31/12) 08/01/2012  . Postpartum care following cesarean delivery (07/31/12) 08/01/2012  . Pain management    Past Surgical History  Procedure Laterality Date  . Cervical fusion    . Ectopic pregnancy surgery    . Cesarean section N/A 07/31/2012    Procedure: CESAREAN SECTION;  Surgeon: Lenoard Aden, MD;  Location: [facility];  Service: Obstetrics;  Laterality: N/A;   EDD: 08/18/12   No family history on file. History  Substance Use Topics  . Smoking status: Never Smoker   . Smokeless tobacco: Not on file  . Alcohol Use: No   OB History   Grav Para Term Preterm Abortions TAB SAB Ect Mult Living   2 1 1  1   1  1      Review of Systems  Constitutional: Negative.   HENT: Negative.   Eyes: Negative.   Respiratory: Negative.   Cardiovascular: Negative.   Gastrointestinal: Negative.   Endocrine: Negative.   Genitourinary: Negative.   Musculoskeletal: Negative.   Skin:       Painful abscess right medial chest.   Neurological: Negative.   Hematological: Negative.   Psychiatric/Behavioral: Negative.     Allergies  Review of patient's allergies indicates no known allergies.  Home Medications   Current Outpatient Rx  Name  Route  Sig  Dispense   Refill  . OVER THE COUNTER MEDICATION      "cant remember the name 75mg  po Begins with "D"         . doxycycline (VIBRAMYCIN) 100 MG capsule   Oral   Take 1 capsule (100 mg total) by mouth 2 (two) times daily.   20 capsule   0   . flintstones complete (FLINTSTONES) 60 MG chewable tablet   Oral   Chew 1 tablet by mouth 2 (two) times daily.         Marland Kitchen HYDROcodone-acetaminophen (NORCO) 5-325 MG per tablet   Oral   Take 1 tablet by mouth every 6 (six) hours as needed for pain.   30 tablet   0   . ibuprofen (ADVIL,MOTRIN) 600 MG tablet   Oral   Take 1 tablet (600 mg total) by mouth every 6 (six) hours.   40 tablet   1   . oxyCODONE-acetaminophen (PERCOCET/ROXICET) 5-325 MG per tablet   Oral   Take 1-2 tablets by mouth every 4 (four) hours as needed.   30 tablet   0   . triamcinolone cream (KENALOG) 0.1 %   Topical   Apply topically 2 (two) times daily. Apply bid x 2 weeks to affected area.   30 g   0    BP 114/73  Pulse 92  Temp(Src) 98.3 F (36.8 C) (Oral)  Resp  19  SpO2 100% Physical Exam  Constitutional: She is oriented to person, place, and time. She appears well-developed and well-nourished.  HENT:  Head: Normocephalic and atraumatic.  Eyes: EOM are normal. Pupils are equal, round, and reactive to light.  Neck: Normal range of motion.  Cardiovascular: Normal rate and regular rhythm.   Pulmonary/Chest: Effort normal and breath sounds normal. Right breast exhibits skin change and tenderness.    3-4 cm abscess  Musculoskeletal: Normal range of motion.  Neurological: She is alert and oriented to person, place, and time.  Skin: Skin is warm and dry.  Psychiatric: She has a normal mood and affect.    ED Course  INCISION AND DRAINAGE Date/Time: 11/09/2012 6:01 PM Performed by: Zonia Kief Authorized by: Bradd Canary D Consent: Verbal consent obtained. Risks and benefits: risks, benefits and alternatives were discussed Consent given by:  patient Patient understanding: patient states understanding of the procedure being performed Patient consent: the patient's understanding of the procedure matches consent given Procedure consent: procedure consent matches procedure scheduled Relevant documents: relevant documents present and verified Site marked: the operative site was marked Imaging studies: imaging studies not available Required items: required blood products, implants, devices, and special equipment available Patient identity confirmed: verbally with patient Type: abscess Location: right medial chest. Anesthesia: local infiltration Local anesthetic: lidocaine 2% without epinephrine Anesthetic total: 2 ml Scalpel size: 11 Needle gauge: 25. Incision type: single straight Complexity: simple Drainage: purulent Drainage amount: moderate Wound treatment: wound left open Packing material: 1/4 in iodoform gauze Patient tolerance: Patient tolerated the procedure well with no immediate complications.   (including critical care time) Labs Reviewed - No data to display No results found. 1. Abscess of chest     MDM  Will f/u this Monday for wound check and packing removal.  Is symptoms worsen sooner she will return here or go to ED.  Voices understanding.  All questions answered.    Meds ordered this encounter  Medications  . OVER THE COUNTER MEDICATION    Sig: "cant remember the name 75mg  po Begins with "D"  . HYDROcodone-acetaminophen (NORCO) 5-325 MG per tablet    Sig: Take 1 tablet by mouth every 6 (six) hours as needed for pain.    Dispense:  30 tablet    Refill:  0  . doxycycline (VIBRAMYCIN) 100 MG capsule    Sig: Take 1 capsule (100 mg total) by mouth 2 (two) times daily.    Dispense:  20 capsule    Refill:  0    Zonia Kief, PA-C 11/09/12 1811

## 2012-11-09 NOTE — ED Notes (Signed)
Instructed to put on gown 

## 2012-11-09 NOTE — ED Provider Notes (Signed)
Medical screening examination/treatment/procedure(s) were performed by resident physician or non-physician practitioner and as supervising physician I was immediately available for consultation/collaboration.   Barkley Bruns MD.   Linna Hoff, MD 11/09/12 [phone]

## 2012-11-13 LAB — CULTURE, ROUTINE-ABSCESS: Gram Stain: NONE SEEN

## 2012-11-14 ENCOUNTER — Telehealth (HOSPITAL_COMMUNITY): Payer: Self-pay | Admitting: *Deleted

## 2012-11-14 MED ORDER — CLINDAMYCIN HCL 300 MG PO CAPS: 300.0000 mg | ORAL_CAPSULE | Freq: Three times a day (TID) | ORAL | Status: DC

## 2012-11-14 NOTE — ED Notes (Signed)
Abscess culture chest: Few Microaerophilic Streptococci. Pt. treated with Doxycycline- no sensitivity.  7/23 Message sent to Zonia Kief PA.  7/24 Discussed with PA and he said pt.was supposed to come for a recheck and packing removal on Monday.  He said to call pt. for improvement. If not improved he will add Clindamycin. I called pt. Pt. verified x 2 and given results.  Pt. said she has a new baby and could not come on Mon. or Tues because her husband worked late. States she came yesterday but was told the wait time was 1 hr. and could not wait due the [employer] administrator.  She said the packing came out [place]. and it is looking better, not hurting, drainage decreased- not much today, no redness just pink, and decreased swelling.  Discussed with PA again. He said to encourage pt. to come back in next 2 days for a recheck and he will e-prescribe Clindamycin to the [facility] on [address]. Pt. given this information and said she would start it and come back tomorrow. I suggested coming when we first open to get back to the room first.  Pt. voiced understanding. Vassie Moselle 11/14/2012

## 2012-11-15 ENCOUNTER — Encounter (HOSPITAL_COMMUNITY): Payer: Self-pay

## 2012-11-15 ENCOUNTER — Emergency Department (INDEPENDENT_AMBULATORY_CARE_PROVIDER_SITE_OTHER)
Admission: EM | Admit: 2012-11-15 | Discharge: 2012-11-15 | Disposition: A | Discharge status: Home / Self Care | Payer: BC Managed Care – PPO | Source: Home / Self Care | Attending: Family Medicine | Admitting: Family Medicine

## 2012-11-15 DIAGNOSIS — Z4800 Encounter for change or removal of nonsurgical wound dressing: Secondary | ICD-10-CM

## 2012-11-15 DIAGNOSIS — L0291 Cutaneous abscess, unspecified: Secondary | ICD-10-CM

## 2012-11-15 DIAGNOSIS — L0390 Cellulitis, unspecified: Secondary | ICD-10-CM

## 2012-11-15 MED ORDER — MUPIROCIN 2 % EX OINT: TOPICAL_OINTMENT | Freq: Three times a day (TID) | CUTANEOUS | Status: DC

## 2012-11-15 MED ORDER — CHLORHEXIDINE GLUCONATE 4 % EX LIQD: 60.0000 mL | Freq: Every day | CUTANEOUS | Status: DC | PRN

## 2012-11-15 NOTE — ED Provider Notes (Signed)
CSN: [id number]     Arrival date & time 11/15/12  0904 History     First MD Initiated Contact with Patient 11/15/12 0915     Chief Complaint  Patient presents with  . Wound Check   (Consider location/radiation/quality/duration/timing/severity/associated sxs/prior Treatment) HPI Comments: 32 year old nondiabetic female here for followup of an abscess in the right breast that was status post incision and drainage in July 19. Reports improved pain, swelling and just having minimal drainage. No fever or chills. Patient taking doxycycline day 4/10. growing strep microaerophilous on wound culture. 4 months post partum no breast feeding otherwise doing well.    Past Medical History  Diagnosis Date  . No pertinent past medical history   . S/P primary low transverse C-section (07/31/12) 08/01/2012  . Postpartum care following cesarean delivery (07/31/12) 08/01/2012  . Pain management    Past Surgical History  Procedure Laterality Date  . Cervical fusion    . Ectopic pregnancy surgery    . Cesarean section N/A 07/31/2012    Procedure: CESAREAN SECTION;  Surgeon: Lenoard Aden, MD;  Location: [facility];  Service: Obstetrics;  Laterality: N/A;   EDD: 08/18/12   History reviewed. No pertinent family history. History  Substance Use Topics  . Smoking status: Never Smoker   . Smokeless tobacco: Not on file  . Alcohol Use: No   OB History   Grav Para Term Preterm Abortions TAB SAB Ect Mult Living   2 1 1  1   1  1      Review of Systems  Constitutional: Negative for fever, chills and appetite change.  Gastrointestinal: Negative for nausea, vomiting and abdominal pain.  Skin: Positive for wound.  Neurological: Negative for dizziness and headaches.    Allergies  Review of patient's allergies indicates no known allergies.  Home Medications   Current Outpatient Rx  Name  Route  Sig  Dispense  Refill  . chlorhexidine (HIBICLENS) 4 % external liquid   Topical   Apply 60 mLs (4 application  total) topically daily as needed.   120 mL   0   . doxycycline (VIBRAMYCIN) 100 MG capsule   Oral   Take 1 capsule (100 mg total) by mouth 2 (two) times daily.   20 capsule   0   . flintstones complete (FLINTSTONES) 60 MG chewable tablet   Oral   Chew 1 tablet by mouth 2 (two) times daily.         Marland Kitchen HYDROcodone-acetaminophen (NORCO) 5-325 MG per tablet   Oral   Take 1 tablet by mouth every 6 (six) hours as needed for pain.   30 tablet   0   . ibuprofen (ADVIL,MOTRIN) 600 MG tablet   Oral   Take 1 tablet (600 mg total) by mouth every 6 (six) hours.   40 tablet   1   . mupirocin ointment (BACTROBAN) 2 %   Topical   Apply topically 3 (three) times daily.   22 g   0   . OVER THE COUNTER MEDICATION      "cant remember the name 75mg  po Begins with "D"         . oxyCODONE-acetaminophen (PERCOCET/ROXICET) 5-325 MG per tablet   Oral   Take 1-2 tablets by mouth every 4 (four) hours as needed.   30 tablet   0   . triamcinolone cream (KENALOG) 0.1 %   Topical   Apply topically 2 (two) times daily. Apply bid x 2 weeks to affected area.  30 g   0    BP 109/62  Pulse 82  Temp(Src) 97.7 F (36.5 C) (Oral)  Resp 16  SpO2 99% Physical Exam  Nursing note and vitals reviewed. Constitutional: She is oriented to person, place, and time. She appears well-developed and well-nourished. No distress.  Cardiovascular: Normal heart sounds.   Pulmonary/Chest: Breath sounds normal.  Lymphadenopathy:    She has no cervical adenopathy.  Neurological: She is alert and oriented to person, place, and time.  Skin: She is not diaphoretic.  There is an area of induration and minimal tenderness with no erythema located in medial border of right breast. There is scant yellow exudate on top. No fluctuations or cellulitis.     ED Course   Procedures (including critical care time)  Labs Reviewed - No data to display No results found. 1. Abscess     MDM  Improving pain and  swelling in abscess area after I&D. Taking doxycycline. Asked to complete antibiotics for 10 days. Prescribed Hibiclens and mupirocin ointment. Supportive care including wound care and red flags that should prompt her return to medical attention discussed with patient and provided in writing.  Sharin Grave, MD 11/15/12 [phone]

## 2012-11-15 NOTE — ED Notes (Signed)
Here for wound check; packing came out on it's own, patient feels better; see new note from [place] 7-19 regarding C&S reports

## 2013-03-03 ENCOUNTER — Other Ambulatory Visit: Payer: Self-pay | Admitting: Physical Medicine and Rehabilitation

## 2013-03-03 DIAGNOSIS — G894 Chronic pain syndrome: Secondary | ICD-10-CM

## 2013-03-03 DIAGNOSIS — M5412 Radiculopathy, cervical region: Secondary | ICD-10-CM

## 2013-03-03 DIAGNOSIS — M961 Postlaminectomy syndrome, not elsewhere classified: Secondary | ICD-10-CM

## 2013-03-03 DIAGNOSIS — M542 Cervicalgia: Secondary | ICD-10-CM

## 2013-03-03 DIAGNOSIS — IMO0002 Reserved for concepts with insufficient information to code with codable children: Secondary | ICD-10-CM

## 2013-03-12 ENCOUNTER — Ambulatory Visit
Admission: RE | Admit: 2013-03-12 | Discharge: 2013-03-12 | Disposition: A | Discharge status: Home / Self Care | Payer: BC Managed Care – PPO | Source: Ambulatory Visit | Attending: Physical Medicine and Rehabilitation | Admitting: Physical Medicine and Rehabilitation

## 2013-03-12 DIAGNOSIS — M961 Postlaminectomy syndrome, not elsewhere classified: Secondary | ICD-10-CM

## 2013-03-12 DIAGNOSIS — M5412 Radiculopathy, cervical region: Secondary | ICD-10-CM

## 2013-03-12 DIAGNOSIS — M542 Cervicalgia: Secondary | ICD-10-CM

## 2013-03-12 DIAGNOSIS — IMO0002 Reserved for concepts with insufficient information to code with codable children: Secondary | ICD-10-CM

## 2013-03-12 DIAGNOSIS — G894 Chronic pain syndrome: Secondary | ICD-10-CM

## 2013-05-02 ENCOUNTER — Other Ambulatory Visit: Payer: Self-pay | Admitting: Physical Medicine and Rehabilitation

## 2013-05-02 ENCOUNTER — Ambulatory Visit
Admission: RE | Admit: 2013-05-02 | Discharge: 2013-05-02 | Disposition: A | Discharge status: Home / Self Care | Payer: BC Managed Care – PPO | Source: Ambulatory Visit | Attending: Physical Medicine and Rehabilitation | Admitting: Physical Medicine and Rehabilitation

## 2013-05-02 DIAGNOSIS — M542 Cervicalgia: Secondary | ICD-10-CM

## 2013-05-31 ENCOUNTER — Emergency Department (INDEPENDENT_AMBULATORY_CARE_PROVIDER_SITE_OTHER)
Admission: EM | Admit: 2013-05-31 | Discharge: 2013-05-31 | Disposition: A | Discharge status: Home / Self Care | Payer: BC Managed Care – PPO | Source: Home / Self Care | Attending: Family Medicine | Admitting: Family Medicine

## 2013-05-31 ENCOUNTER — Encounter (HOSPITAL_COMMUNITY): Payer: Self-pay | Admitting: Emergency Medicine

## 2013-05-31 DIAGNOSIS — J069 Acute upper respiratory infection, unspecified: Secondary | ICD-10-CM

## 2013-05-31 LAB — POCT RAPID STREP A: Streptococcus, Group A Screen (Direct): NEGATIVE

## 2013-05-31 MED ORDER — GUAIFENESIN-CODEINE 100-10 MG/5ML PO SOLN: 5.0000 mL | Freq: Every evening | ORAL | Status: DC | PRN

## 2013-05-31 MED ORDER — PREDNISONE 10 MG PO TABS: 30.0000 mg | ORAL_TABLET | Freq: Every day | ORAL | Status: DC

## 2013-05-31 MED ORDER — IPRATROPIUM BROMIDE 0.06 % NA SOLN: 2.0000 | Freq: Four times a day (QID) | NASAL | Status: DC

## 2013-05-31 NOTE — ED Notes (Signed)
Pt c/o sore throat onset Wednesday accompanied by a productive cough Denies f/v/n/d, SOB, wheezing Alert w/no signs of acute distress

## 2013-05-31 NOTE — Discharge Instructions (Signed)
Thank you for coming in today. Take prednisone daily for 5 days.  Use the atrovent nasal spray as needed.  Use the cough medicine as needed.  Call or go to the emergency room if you get worse, have trouble breathing, have chest pains, or palpitations.   Cough, Adult  A cough is a reflex that helps clear your throat and airways. It can help heal the body or may be a reaction to an irritated airway. A cough may only last 2 or 3 weeks (acute) or may last more than 8 weeks (chronic).  CAUSES Acute cough:  Viral or bacterial infections. Chronic cough:  Infections.  Allergies.  Asthma.  Post-nasal drip.  Smoking.  Heartburn or acid reflux.  Some medicines.  Chronic lung problems (COPD).  Cancer. SYMPTOMS   Cough.  Fever.  Chest pain.  Increased breathing rate.  High-pitched whistling sound when breathing (wheezing).  Colored mucus that you cough up (sputum). TREATMENT   A bacterial cough may be treated with antibiotic medicine.  A viral cough must run its course and will not respond to antibiotics.  Your caregiver may recommend other treatments if you have a chronic cough. HOME CARE INSTRUCTIONS   Only take over-the-counter or prescription medicines for pain, discomfort, or fever as directed by your caregiver. Use cough suppressants only as directed by your caregiver.  Use a cold steam vaporizer or humidifier in your bedroom or home to help loosen secretions.  Sleep in a semi-upright position if your cough is worse at night.  Rest as needed.  Stop smoking if you smoke. SEEK IMMEDIATE MEDICAL CARE IF:   You have pus in your sputum.  Your cough starts to worsen.  You cannot control your cough with suppressants and are losing sleep.  You begin coughing up blood.  You have difficulty breathing.  You develop pain which is getting worse or is uncontrolled with medicine.  You have a fever. MAKE SURE YOU:   Understand these instructions.  Will watch  your condition.  Will get help right away if you are not doing well or get worse. Document Released: 10/07/2010 Document Revised: 07/03/2011 Document Reviewed: 10/07/2010 [facility] Patient Information 2014 [place].

## 2013-05-31 NOTE — ED Provider Notes (Signed)
Victoria Wagner is a 33 y.o. female who presents to Urgent Care today for sore throat. Patient has 3 days of sore throat cough and mild runny nose. No fevers or chills nausea vomiting or diarrhea. Patient has tried DayQuil NyQuil and Mucinex which have not helped much. She denies any significant shortness of breath and feels well otherwise.   Past Medical History  Diagnosis Date  . No pertinent past medical history   . S/P primary low transverse C-section (07/31/12) 08/01/2012  . Postpartum care following cesarean delivery (07/31/12) 08/01/2012  . Pain management    History  Substance Use Topics  . Smoking status: Never Smoker   . Smokeless tobacco: Not on file  . Alcohol Use: No   ROS as above Medications: No current facility-administered medications for this encounter.   Current Outpatient Prescriptions  Medication Sig Dispense Refill  . chlorhexidine (HIBICLENS) 4 % external liquid Apply 60 mLs (4 application total) topically daily as needed.  120 mL  0  . doxycycline (VIBRAMYCIN) 100 MG capsule Take 1 capsule (100 mg total) by mouth 2 (two) times daily.  20 capsule  0  . flintstones complete (FLINTSTONES) 60 MG chewable tablet Chew 1 tablet by mouth 2 (two) times daily.      Marland Kitchen guaiFENesin-codeine 100-10 MG/5ML syrup Take 5 mLs by mouth at bedtime as needed for cough.  120 mL  0  . HYDROcodone-acetaminophen (NORCO) 5-325 MG per tablet Take 1 tablet by mouth every 6 (six) hours as needed for pain.  30 tablet  0  . ibuprofen (ADVIL,MOTRIN) 600 MG tablet Take 1 tablet (600 mg total) by mouth every 6 (six) hours.  40 tablet  1  . ipratropium (ATROVENT) 0.06 % nasal spray Place 2 sprays into both nostrils 4 (four) times daily.  15 mL  1  . mupirocin ointment (BACTROBAN) 2 % Apply topically 3 (three) times daily.  22 g  0  . OVER THE COUNTER MEDICATION "cant remember the name 75mg  po Begins with "D"      . oxyCODONE-acetaminophen (PERCOCET/ROXICET) 5-325 MG per tablet Take 1-2 tablets by mouth  every 4 (four) hours as needed.  30 tablet  0  . predniSONE (DELTASONE) 10 MG tablet Take 3 tablets (30 mg total) by mouth daily.  15 tablet  0  . triamcinolone cream (KENALOG) 0.1 % Apply topically 2 (two) times daily. Apply bid x 2 weeks to affected area.  30 g  0    Exam:  BP 124/82  Pulse 86  Temp(Src) 98.9 F (37.2 C) (Oral)  Resp 16  SpO2 98%  Breastfeeding? No Gen: Well NAD HEENT: EOMI,  MMM posterior pharynx with cobblestoning. Tympanic membranes are normal appearing bilaterally. Lungs: Normal work of breathing. CTABL Heart: RRR no MRG Abd: NABS, Soft. NT, ND Exts: Brisk capillary refill, warm and well perfused.   Results for orders placed during the hospital encounter of 05/31/13 (from the past 24 hour(s))  POCT RAPID STREP A ([place])     Status: None   Collection Time    05/31/13  9:59 AM      Result Value Range   Streptococcus, Group A Screen (Direct) NEGATIVE  NEGATIVE   No results found.  Assessment and Plan: 33 y.o. female with viral URI and pharyngitis with postnasal drip. Plan to treat symptomatically with prednisone, Atrovent nasal spray, and codeine containing cough medication. Followup with primary care provider.  Discussed warning signs or symptoms. Please see discharge instructions. Patient expresses understanding.  Gregor Hams, MD 05/31/13 1017

## 2013-06-02 LAB — CULTURE, GROUP A STREP

## 2013-06-23 ENCOUNTER — Other Ambulatory Visit: Payer: Self-pay

## 2014-02-16 ENCOUNTER — Other Ambulatory Visit (HOSPITAL_COMMUNITY)
Admission: RE | Admit: 2014-02-16 | Discharge: 2014-02-16 | Disposition: A | Discharge status: Home / Self Care | Payer: BC Managed Care – PPO | Source: Ambulatory Visit | Attending: Emergency Medicine | Admitting: Emergency Medicine

## 2014-02-16 ENCOUNTER — Encounter (HOSPITAL_COMMUNITY): Payer: Self-pay | Admitting: Emergency Medicine

## 2014-02-16 ENCOUNTER — Emergency Department (INDEPENDENT_AMBULATORY_CARE_PROVIDER_SITE_OTHER)
Admission: EM | Admit: 2014-02-16 | Discharge: 2014-02-16 | Disposition: A | Discharge status: Home / Self Care | Payer: BC Managed Care – PPO | Source: Home / Self Care | Attending: Emergency Medicine | Admitting: Emergency Medicine

## 2014-02-16 DIAGNOSIS — Z113 Encounter for screening for infections with a predominantly sexual mode of transmission: Secondary | ICD-10-CM | POA: Insufficient documentation

## 2014-02-16 DIAGNOSIS — N760 Acute vaginitis: Secondary | ICD-10-CM | POA: Diagnosis present

## 2014-02-16 DIAGNOSIS — N730 Acute parametritis and pelvic cellulitis: Secondary | ICD-10-CM

## 2014-02-16 LAB — POCT URINALYSIS DIP (DEVICE)
Bilirubin Urine: NEGATIVE
Glucose, UA: NEGATIVE mg/dL
Hgb urine dipstick: NEGATIVE
Ketones, ur: NEGATIVE mg/dL
Nitrite: NEGATIVE
Protein, ur: NEGATIVE mg/dL
Specific Gravity, Urine: 1.02 (ref 1.005–1.030)
Urobilinogen, UA: 0.2 mg/dL (ref 0.0–1.0)
pH: 7 (ref 5.0–8.0)

## 2014-02-16 LAB — CERVICOVAGINAL ANCILLARY ONLY
Wet Prep (BD Affirm): NEGATIVE
Wet Prep (BD Affirm): NEGATIVE
Wet Prep (BD Affirm): POSITIVE — AB

## 2014-02-16 LAB — POCT PREGNANCY, URINE: Preg Test, Ur: NEGATIVE

## 2014-02-16 MED ORDER — DOXYCYCLINE HYCLATE 100 MG PO CAPS: 100.0000 mg | ORAL_CAPSULE | Freq: Two times a day (BID) | ORAL | Status: DC

## 2014-02-16 MED ORDER — LIDOCAINE HCL (PF) 1 % IJ SOLN
INTRAMUSCULAR | Status: AC
  Filled 2014-02-16: qty 5

## 2014-02-16 MED ORDER — CEFTRIAXONE SODIUM 250 MG IJ SOLR
250.0000 mg | Freq: Once | INTRAMUSCULAR | Status: AC
  Administered 2014-02-16: 250 mg via INTRAMUSCULAR

## 2014-02-16 MED ORDER — CEFTRIAXONE SODIUM 250 MG IJ SOLR
INTRAMUSCULAR | Status: AC
  Filled 2014-02-16: qty 250

## 2014-02-16 MED ORDER — METRONIDAZOLE 500 MG PO TABS: 500.0000 mg | ORAL_TABLET | Freq: Two times a day (BID) | ORAL | Status: DC

## 2014-02-16 NOTE — ED Provider Notes (Signed)
CSN: [id number]     Arrival date & time 02/16/14  1011 History   First MD Initiated Contact with Patient 02/16/14 1107     Chief Complaint  Patient presents with  . Vaginitis   (Consider location/radiation/quality/duration/timing/severity/associated sxs/prior Treatment) HPI Comments: PCP: Dr. Ace Gins Gyn: Dr. Ronita Hipps LNMP: has IUD Reports mild associated pelvic discomfort.  Patient is a 33 y.o. female presenting with vaginal discharge. The history is provided by the patient.  Vaginal Discharge Quality:  Malodorous Onset quality:  Gradual Duration:  5 days Timing:  Constant Progression:  Unchanged Chronicity:  New   Past Medical History  Diagnosis Date  . No pertinent past medical history   . S/P primary low transverse C-section (07/31/12) 08/01/2012  . Postpartum care following cesarean delivery (07/31/12) 08/01/2012  . Pain management    Past Surgical History  Procedure Laterality Date  . Cervical fusion    . Ectopic pregnancy surgery    . Cesarean section N/A 07/31/2012    Procedure: CESAREAN SECTION;  Surgeon: Lovenia Kim, MD;  Location: [facility];  Service: Obstetrics;  Laterality: N/A;   EDD: 08/18/12   History reviewed. No pertinent family history. History  Substance Use Topics  . Smoking status: Never Smoker   . Smokeless tobacco: Not on file  . Alcohol Use: No   OB History   Grav Para Term Preterm Abortions TAB SAB Ect Mult Living   2 1 1  1   1  1      Review of Systems  Genitourinary: Positive for vaginal discharge.  All other systems reviewed and are negative.   Allergies  Review of patient's allergies indicates no known allergies.  Home Medications   Prior to Admission medications   Medication Sig Start Date End Date Taking? Authorizing Provider  chlorhexidine (HIBICLENS) 4 % external liquid Apply 60 mLs (4 application total) topically daily as needed. 11/15/12   Adlih Moreno-Coll, MD  doxycycline (VIBRAMYCIN) 100 MG capsule Take 1 capsule (100 mg  total) by mouth 2 (two) times daily. 11/09/12   Benjiman Core, PA-C  doxycycline (VIBRAMYCIN) 100 MG capsule Take 1 capsule (100 mg total) by mouth 2 (two) times daily. X 14 days 02/16/14   Lutricia Feil, PA  flintstones complete (FLINTSTONES) 60 MG chewable tablet Chew 1 tablet by mouth 2 (two) times daily.    Historical Provider, MD  guaiFENesin-codeine 100-10 MG/5ML syrup Take 5 mLs by mouth at bedtime as needed for cough. 05/31/13   Gregor Hams, MD  HYDROcodone-acetaminophen (NORCO) 5-325 MG per tablet Take 1 tablet by mouth every 6 (six) hours as needed for pain. 11/09/12   Benjiman Core, PA-C  ibuprofen (ADVIL,MOTRIN) 600 MG tablet Take 1 tablet (600 mg total) by mouth every 6 (six) hours. 08/03/12   Elveria Royals, MD  ipratropium (ATROVENT) 0.06 % nasal spray Place 2 sprays into both nostrils 4 (four) times daily. 05/31/13   Gregor Hams, MD  metroNIDAZOLE (FLAGYL) 500 MG tablet Take 1 tablet (500 mg total) by mouth 2 (two) times daily. X 14 days 02/16/14   Lutricia Feil, PA  mupirocin ointment (BACTROBAN) 2 % Apply topically 3 (three) times daily. 11/15/12   Adlih Moreno-Coll, MD  OVER THE COUNTER MEDICATION "cant remember the name 75mg  po Begins with "D"    Historical Provider, MD  oxyCODONE-acetaminophen (PERCOCET/ROXICET) 5-325 MG per tablet Take 1-2 tablets by mouth every 4 (four) hours as needed. 08/03/12   Elveria Royals, MD  predniSONE (DELTASONE) 10  MG tablet Take 3 tablets (30 mg total) by mouth daily. 05/31/13   Gregor Hams, MD  triamcinolone cream (KENALOG) 0.1 % Apply topically 2 (two) times daily. Apply bid x 2 weeks to affected area. 10/01/12   Rosana Hoes, MD   BP 115/77  Pulse 57  Temp(Src) 98.1 F (36.7 C) (Oral)  Resp 16  SpO2 99% Physical Exam  Nursing note and vitals reviewed. Constitutional: She is oriented to person, place, and time. She appears well-developed and well-nourished. No distress.  HENT:  Head: Normocephalic and atraumatic.  Cardiovascular:  Normal rate.   Pulmonary/Chest: Effort normal.  Genitourinary: Uterus normal. Pelvic exam was performed with patient supine. There is no rash, tenderness or lesion on the right labia. There is no rash, tenderness or lesion on the left labia. Cervix exhibits motion tenderness. Cervix exhibits no discharge and no friability. Right adnexum displays no mass, no tenderness and no fullness. No erythema, tenderness or bleeding around the vagina. There is a foreign body around the vagina. No signs of injury around the vagina. Vaginal discharge found.  NuvaRing in place Mild yellow vaginal discharge with associated mild cervicitis  Musculoskeletal: Normal range of motion.  Neurological: She is alert and oriented to person, place, and time.  Skin: Skin is warm and dry. No rash noted. No erythema.  Psychiatric: She has a normal mood and affect. Her behavior is normal.    ED Course  Procedures (including critical care time) Labs Review Labs Reviewed  POCT URINALYSIS DIP (DEVICE) - Abnormal; Notable for the following:    Leukocytes, UA SMALL (*)    All other components within normal limits  POCT PREGNANCY, URINE  CERVICOVAGINAL ANCILLARY ONLY    Imaging Review No results found.   MDM   1. PID (acute pelvic inflammatory disease)    Vital signs and exam do not suggest that patient is septic or has TOA. Mild PID. Will treat as outpatient.  Cervicovaginal swabs sent for testing Will begin treatment here at [facility] with Ceftriaxone 250mg  IM and continue treatment with 14 days of doxycycline and flagyl at home. Advise follow up with Dr. Ronita Hipps if no improvement UA grossly normal and UPT negative    Lutricia Feil, PA 02/16/14 [place], [place] 02/16/14 1142

## 2014-02-16 NOTE — Discharge Instructions (Signed)
Pelvic Inflammatory Disease Pelvic inflammatory disease (PID) refers to an infection in some or all of the female organs. The infection can be in the uterus, ovaries, fallopian tubes, or the surrounding tissues in the pelvis. PID can cause abdominal or pelvic pain that comes on suddenly (acute pelvic pain). PID is a serious infection because it can lead to lasting (chronic) pelvic pain or the inability to have children (infertile).  CAUSES  The infection is often caused by the normal bacteria found in the vaginal tissues. PID may also be caused by an infection that is spread during sexual contact. PID can also occur following:   The birth of a baby.   A miscarriage.   An abortion.   Major pelvic surgery.   The use of an intrauterine device (IUD).   A sexual assault.  RISK FACTORS Certain factors can put a person at higher risk for PID, such as:  Being younger than 25 years.  Being sexually active at [place] age.  Usingnonbarrier contraception.  Havingmultiple sexual partners.  Having sex with someone who has symptoms of a genital infection.  Using oral contraception. Other times, certain behaviors can increase the possibility of getting PID, such as:  Having sex during your period.  Using a vaginal douche.  Having an intrauterine device (IUD) in place. SYMPTOMS   Abdominal or pelvic pain.   Fever.   Chills.   Abnormal vaginal discharge.  Abnormal uterine bleeding.   Unusual pain shortly after finishing your period. DIAGNOSIS  Your caregiver will choose some of the following methods to make a diagnosis, such as:   Performinga physical exam and history. A pelvic exam typically reveals a very tender uterus and surrounding pelvis.   Ordering laboratory tests including a pregnancy test, blood tests, and urine test.  Orderingcultures of the vagina and cervix to check for a sexually transmitted infection (STI).  Performing an ultrasound.    Performing a laparoscopic procedure to look inside the pelvis.  TREATMENT   Antibiotic medicines may be prescribed and taken by mouth.   Sexual partners may be treated when the infection is caused by a sexually transmitted disease (STD).   Hospitalization may be needed to give antibiotics intravenously.  Surgery may be needed, but this is rare. It may take weeks until you are completely well. If you are diagnosed with PID, you should also be checked for human immunodeficiency virus (HIV). HOME CARE INSTRUCTIONS   If given, take your antibiotics as directed. Finish the medicine even if you start to feel better.   Only take over-the-counter or prescription medicines for pain, discomfort, or fever as directed by your caregiver.   Do not have sexual intercourse until treatment is completed or as directed by your caregiver. If PID is confirmed, your recent sexual partner(s) will need treatment.   Keep your follow-up appointments. SEEK MEDICAL CARE IF:   You have increased or abnormal vaginal discharge.   You need prescription medicine for your pain.   You vomit.   You cannot take your medicines.   Your partner has an STD.  SEEK IMMEDIATE MEDICAL CARE IF:   You have a fever.   You have increased abdominal or pelvic pain.   You have chills.   You have pain when you urinate.   You are not better after 72 hours following treatment.  MAKE SURE YOU:   Understand these instructions.  Will watch your condition.  Will get help right away if you are not doing well or get worse.  Document Released: 04/10/2005 Document Revised: 08/05/2012 Document Reviewed: 04/06/2011 [facility] Patient Information 2015 [place], [place]. This information is not intended to replace advice given to you by your health care provider. Make sure you discuss any questions you have with your health care provider.

## 2014-02-16 NOTE — ED Provider Notes (Signed)
Medical screening examination/treatment/procedure(s) were performed by resident physician or non-physician practitioner and as supervising physician I was immediately available for consultation/collaboration.  Maryruth Eve, MD     Melony Overly, MD 02/16/14 1146

## 2014-02-16 NOTE — ED Notes (Signed)
C/o vaginal odor, d/c x couple of days

## 2014-02-16 NOTE — ED Notes (Signed)
Call back number for lab issues verified at release. NAD, no allergic response observable at release

## 2014-02-17 ENCOUNTER — Telehealth (HOSPITAL_COMMUNITY): Payer: Self-pay | Admitting: Emergency Medicine

## 2014-02-17 ENCOUNTER — Telehealth (HOSPITAL_COMMUNITY): Payer: Self-pay | Admitting: *Deleted

## 2014-02-17 LAB — CERVICOVAGINAL ANCILLARY ONLY
Chlamydia: NEGATIVE
Neisseria Gonorrhea: NEGATIVE

## 2014-02-17 MED ORDER — AZITHROMYCIN 500 MG PO TABS: 1000.0000 mg | ORAL_TABLET | Freq: Every day | ORAL | Status: DC

## 2014-02-17 MED ORDER — ONDANSETRON 8 MG PO TBDP: 8.0000 mg | ORAL_TABLET | Freq: Three times a day (TID) | ORAL | Status: DC | PRN

## 2014-02-17 NOTE — ED Notes (Signed)
The patient was treated for PID yesterday. She was given Rocephin 250 mg IM and doxycycline and Flagyl by mouth. She calls back today, stating that she is nauseated. She was told to stop the doxycycline. We have sent in prescriptions for azithromycin 500 mg, #2 tablets, to take 2 at one time, and some Zofran ODT 8 mg sublingual for her nausea. She is to continue on with the Flagyl. If she continues to have problems she is to call back.  Harden Mo, MD 02/17/14 [phone]

## 2014-02-17 NOTE — ED Notes (Signed)
Pt. called and said she picked up her Rx. but it was only 2 pills. I called pt. back and told her that was all she needed. It is a one time treatment dose of Zithromax instead of the week of Doxycycline. I told her he also gave her some Zofran for nausea. She is to finish the Flagyl.  If still having discharge, vaginal irritation or itching, she can use OTC Monistat for pos. [place].  Pt. voiced understanding. Roselyn Meier 02/17/2014

## 2014-02-19 MED ORDER — FLUCONAZOLE 150 MG PO TABS: 150.0000 mg | ORAL_TABLET | Freq: Every day | ORAL | Status: DC

## 2014-02-21 NOTE — ED Notes (Signed)
Patient called w questions about Diflucan that had been called in for her a couple of days ago. Patient advised to use diflucan as directed and to be rechecked if she continues to have problems. She is to d/c flagyl ,as this is causing her to have GI upset, and she is negative for gardnerella or  trichamonis

## 2014-02-23 ENCOUNTER — Encounter (HOSPITAL_COMMUNITY): Payer: Self-pay | Admitting: Emergency Medicine

## 2014-08-06 ENCOUNTER — Encounter: Payer: Self-pay | Admitting: Neurology

## 2014-08-06 ENCOUNTER — Ambulatory Visit (INDEPENDENT_AMBULATORY_CARE_PROVIDER_SITE_OTHER): Payer: Managed Care, Other (non HMO) | Admitting: Neurology

## 2014-08-06 VITALS — BP 135/80 | HR 81 | Ht 66.0 in | Wt 209.0 lb

## 2014-08-06 DIAGNOSIS — R5383 Other fatigue: Secondary | ICD-10-CM | POA: Diagnosis not present

## 2014-08-06 DIAGNOSIS — H5713 Ocular pain, bilateral: Secondary | ICD-10-CM | POA: Diagnosis not present

## 2014-08-06 DIAGNOSIS — R519 Headache, unspecified: Secondary | ICD-10-CM | POA: Insufficient documentation

## 2014-08-06 DIAGNOSIS — G44201 Tension-type headache, unspecified, intractable: Secondary | ICD-10-CM | POA: Diagnosis not present

## 2014-08-06 DIAGNOSIS — H5710 Ocular pain, unspecified eye: Secondary | ICD-10-CM | POA: Insufficient documentation

## 2014-08-06 DIAGNOSIS — G939 Disorder of brain, unspecified: Secondary | ICD-10-CM

## 2014-08-06 DIAGNOSIS — R51 Headache: Secondary | ICD-10-CM

## 2014-08-06 HISTORY — DX: Disorder of brain, unspecified: G93.9

## 2014-08-06 HISTORY — DX: Headache, unspecified: R51.9

## 2014-08-06 MED ORDER — NORTRIPTYLINE HCL 10 MG/5ML PO SOLN
20.0000 mg | Freq: Every day | ORAL | Status: DC
Start: 2014-08-06 — End: 2017-06-30

## 2014-08-06 NOTE — Progress Notes (Signed)
[facility]    Provider:  Dr Jaynee Eagles Referring Provider: Drema Dallas Primary Care Physician:  Pcp Not In System  CC:  headache  HPI:  Victoria Wagner is a 34 y.o. female here as a referral from Dr. Niel Hummer for headache  She has been seeing Dr. Niel Hummer for neck pain after neck surgery.  Headaches since 2009 associated with neck pain.  Squeezing in the temporal areas. The headaches can be constant. The newer headaches now are more in the front, the eyes feel hazy, they feel sharp behind the eyes, light bothers her a little bit, she feels dizzy, just "off". These are different than in the past. Worsening, Headaches started about a month ago. She was started on Fioricet which she was taking every 6 hours which initially helped. Then she decreased the fioricet a bit and the headaches worsened about last Sunday. Her head feels like it is being squeezed. She has increased the Fioricet again.  Ever since then the headaches are worse. Headaches worsening even more since last Sunday. She takes the indomethacin every day. She takes the Nortriptyline as needed. Increased nausea as well. She is very tired. Fioricet worked initially very well and kept taking it, now its not working as well and taking it even more. No new focal neurologic deficits. She does have new eye pain, fatigue, trouble concentrating, forgetfulness.   Reviewed images of MRI cervical spine, agree with findings:  FINDINGS: Sequelae of interval C3-C6 ACDF are identified. There is no evidence of listhesis. Previously described kyphotic curvature of the cervical spine has decreased. Vertebral bone marrow signal is grossly normal. Craniocervical junction is unremarkable. No definite abnormal signal is identified in the cervical spinal cord. There is no evidence of recurrent disc herniation or spinal canal or neural foraminal stenosis. There is no abnormal enhancement. The visualized paraspinal soft  tissues are unremarkable.  IMPRESSION: Interval C3-C6 ACDF. No evidence of recurrent disc herniation or stenosis. No evidence of significant inflammatory change in the cervical spine.  Cbc with mild anemia    Review of Systems: Patient complains of symptoms per HPI as well as the following symptoms: eye pain, forgetfullness, joint pain, aching muscles, headache, numbness, weakness, dizziness,=. Pertinent negatives per HPI. All others negative.   History   Social History  . Marital Status: Married    Spouse Name: Antyuan   . Number of Children: 1  . Years of Education: BA   Occupational History  . Unemployed    Social History Main Topics  . Smoking status: Never Smoker   . Smokeless tobacco: Not on file  . Alcohol Use: No  . Drug Use: No  . Sexual Activity: Not on file   Other Topics Concern  . Not on file   Social History Narrative   Lives at home with husband and child.   Right handed.   Caffeine use: Drinks green tea (Drinks 2-3 times per day)    History reviewed. No pertinent family history.  Past Medical History  Diagnosis Date  . S/P primary low transverse C-section (07/31/12) 08/01/2012  . Postpartum care following cesarean delivery (07/31/12) 08/01/2012  . Pain management   . Cervical radiculopathy   . Myofascial pain   . Degeneration of cervical intervertebral disc   . Cataract     Post traumatic- per patient  . Neuritis   . Occipital headache   . Brachial neuritis   . Muscle spasm   . Neuralgia   . Myalgia   . Myositis  Past Surgical History  Procedure Laterality Date  . Cervical fusion  04/2010    C3-C6  . Ectopic pregnancy surgery  04/2005  . Cesarean section N/A 07/31/2012    Procedure: CESAREAN SECTION;  Surgeon: Lovenia Kim, MD;  Location: [facility];  Service: Obstetrics;  Laterality: N/A;   EDD: 08/18/12    Current Outpatient Prescriptions  Medication Sig Dispense Refill  . butalbital-acetaminophen-caffeine (FIORICET WITH  CODEINE) 50-325-40-30 MG per capsule Take 1 capsule by mouth every 6 (six) hours as needed.  0  . cyclobenzaprine (FLEXERIL) 10 MG tablet Take 10 mg by mouth as needed.    . indomethacin (INDOCIN) 50 MG capsule Take 50 mg by mouth daily.  5  . LORZONE 375 MG TABS Take 375 mg by mouth daily.    . methylPREDNIsolone (MEDROL DOSPACK) 4 MG tablet   0  . nortriptyline (PAMELOR) 10 MG/5ML solution Take 10 mg by mouth as needed. 1/2-1 teaspoonful by mouth up to 3 times daily  5  . NUVARING 0.12-0.015 MG/24HR vaginal ring   11  . topiramate (TOPAMAX) 25 MG tablet Take 25 mg by mouth daily.  5  . ondansetron (ZOFRAN ODT) 8 MG disintegrating tablet Take 1 tablet (8 mg total) by mouth every 8 (eight) hours as needed for nausea. (Patient not taking: Reported on 08/06/2014) 12 tablet 0  . predniSONE (DELTASONE) 10 MG tablet Take 3 tablets (30 mg total) by mouth daily. (Patient not taking: Reported on 08/06/2014) 15 tablet 0   No current facility-administered medications for this visit.    Allergies as of 08/06/2014  . (No Known Allergies)    Vitals: BP 135/80 mmHg  Pulse 81  Ht 5\' 6"  (1.676 m)  Wt 209 lb (94.802 kg)  BMI 33.75 kg/m2 Last Weight:  Wt Readings from Last 1 Encounters:  08/06/14 209 lb (94.802 kg)   Last Height:   Ht Readings from Last 1 Encounters:  08/06/14 5\' 6"  (1.676 m)   Physical exam: Exam: Gen: NAD, conversant, well nourised, obese, well groomed                     CV: RRR, no MRG. No Carotid Bruits. No peripheral edema, warm, nontender Eyes: Conjunctivae clear without exudates or hemorrhage  Neuro: Detailed Neurologic Exam  Speech:    Speech is normal; fluent and spontaneous with normal comprehension.  Cognition:    The patient is oriented to person, place, and time;     recent and remote memory intact;     language fluent;     normal attention, concentration,     fund of knowledge Cranial Nerves:    The pupils are equal, round, and reactive to light. The  fundi are normal and spontaneous venous pulsations are present. Visual fields are full to finger confrontation. Extraocular movements are intact. Trigeminal sensation is intact and the muscles of mastication are normal. The face is symmetric. The palate elevates in the midline. Hearing intact. Voice is normal. Shoulder shrug is normal. The tongue has normal motion without fasciculations.   Coordination:    Normal finger to nose and heel to shin.   Gait:    Heel-toe and tandem gait are normal.   Motor Observation:    No asymmetry, no atrophy, and no involuntary movements noted. Tone:    Normal muscle tone.    Posture:    Posture is normal. normal erect    Strength:    Strength is V/V in the upper and lower limbs.  Sensation: intact to LT     Reflex Exam:  DTR's:    Deep tendon reflexes in the upper and lower extremities are brisk bilaterally.   Toes:    The toes are downgoing bilaterally.   Clonus:    Clonus is absent.    Assessment/Plan:  34 year old female with new onset pressure-type headache, eye pain, fatigue, trouble concentrating, nausea, feeling foggy.  Recommend stopping Fioricet. Likely medication overuse component. Unfortunately headache may get worse when she stops, warned her. Increase nortriptyline to 20mg  every night, may help with both pain and headache   Sarina Ill, MD  [facility] [address] [place], [place] [address]  Phone [phone] Fax [phone]

## 2014-08-06 NOTE — Patient Instructions (Addendum)
Overall you are doing fairly well but I do want to suggest a few things today:   Remember to drink plenty of fluid, eat healthy meals and do not skip any meals. Try to eat protein with a every meal and eat a healthy snack such as fruit or nuts in between meals. Try to keep a regular sleep-wake schedule and try to exercise daily, particularly in the form of walking, 20-30 minutes a day, if you can.   As far as your medications are concerned, I would like to suggest: Nortriptyline to 20mg  every night, stop the fioricet  As far as diagnostic testing: MRI of the brain  I would like to see you back as needed, sooner if we need to. Please call us with any interim questions, concerns, problems, updates or refill requests.   Please also call us for any test results so we can go over those with you on the phone.  My clinical assistant and will answer any of your questions and relay your messages to me and also relay most of my messages to you.   Our phone number is [phone]. We also have an after hours call service for urgent matters and there is a physician on-call for urgent questions. For any emergencies you know to call 911 or go to the nearest emergency room

## 2014-08-21 ENCOUNTER — Encounter: Payer: Self-pay | Admitting: Primary Care

## 2014-08-21 ENCOUNTER — Ambulatory Visit (INDEPENDENT_AMBULATORY_CARE_PROVIDER_SITE_OTHER): Payer: Self-pay | Admitting: Primary Care

## 2014-08-21 VITALS — BP 116/78 | HR 82 | Temp 98.1°F | Ht 66.0 in | Wt 206.0 lb

## 2014-08-21 DIAGNOSIS — R1032 Left lower quadrant pain: Secondary | ICD-10-CM

## 2014-08-21 DIAGNOSIS — E669 Obesity, unspecified: Secondary | ICD-10-CM | POA: Insufficient documentation

## 2014-08-21 DIAGNOSIS — E66811 Obesity, class 1: Secondary | ICD-10-CM | POA: Insufficient documentation

## 2014-08-21 LAB — COMPREHENSIVE METABOLIC PANEL
ALT: 88 U/L — ABNORMAL HIGH (ref 0–35)
AST: 48 U/L — ABNORMAL HIGH (ref 0–37)
Albumin: 3.4 g/dL — ABNORMAL LOW (ref 3.5–5.2)
Alkaline Phosphatase: 149 U/L — ABNORMAL HIGH (ref 39–117)
BUN: 16 mg/dL (ref 6–23)
CO2: 21 mEq/L (ref 19–32)
Calcium: 8.6 mg/dL (ref 8.4–10.5)
Chloride: 108 mEq/L (ref 96–112)
Creat: 0.86 mg/dL (ref 0.50–1.10)
Glucose, Bld: 84 mg/dL (ref 70–99)
Potassium: 3.9 mEq/L (ref 3.5–5.3)
Sodium: 139 mEq/L (ref 135–145)
Total Bilirubin: 0.5 mg/dL (ref 0.2–1.2)
Total Protein: 6.4 g/dL (ref 6.0–8.3)

## 2014-08-21 MED ORDER — METRONIDAZOLE 500 MG PO TABS: 500.0000 mg | ORAL_TABLET | Freq: Three times a day (TID) | ORAL | Status: DC

## 2014-08-21 MED ORDER — CIPROFLOXACIN HCL 500 MG PO TABS: 500.0000 mg | ORAL_TABLET | Freq: Two times a day (BID) | ORAL | Status: DC

## 2014-08-21 NOTE — Progress Notes (Signed)
Pre visit review using our clinic review tool, if applicable. No additional management support is needed unless otherwise documented below in the visit note. 

## 2014-08-21 NOTE — Patient Instructions (Signed)
It's likely that you have diverticulitis. We cannot confirm this unless you complete a CT scan. Try taking the antibiotics as prescribed: Cipro: 1 tablet twice daily for 7 days. Metronidazole: 1 tablet three times daily for 7 days. Complete lab work prior to leaving today. I will notify you of your results. Go to the emergency department if you experience worsening pain, bloody stools, vomiting. Follow up in one week for re-evaluation.

## 2014-08-21 NOTE — Assessment & Plan Note (Signed)
Working hard to lose weight through her diet. She follows a healthy diet and will occasionally exercise when able. She's lost 41 pounds in 2 years. I commended her on her efforts and encouraged her to keep up the good work.

## 2014-08-21 NOTE — Assessment & Plan Note (Addendum)
Suspect diverticulitis based off of exam and symptoms. She has no prior diagnosis and does not want CT scan for confirmation. Will treat empirically with course of Cipro and Flagyl. She has phenergan and pain medication at home. She is to go to the emergency department if she develops worsening pain, vomiting, bloody stools. She verbalized understanding. Follow up in one week for re-evaluation.
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
## 2014-08-22 ENCOUNTER — Encounter: Payer: Self-pay | Admitting: Internal Medicine

## 2014-08-22 ENCOUNTER — Ambulatory Visit (INDEPENDENT_AMBULATORY_CARE_PROVIDER_SITE_OTHER): Payer: Managed Care, Other (non HMO) | Admitting: Internal Medicine

## 2014-08-22 VITALS — BP 110/84 | Temp 98.4°F | Wt 205.0 lb

## 2014-08-22 DIAGNOSIS — R1013 Epigastric pain: Secondary | ICD-10-CM

## 2014-08-22 LAB — CBC WITH DIFFERENTIAL/PLATELET
Basophils Absolute: 0.1 10*3/uL (ref 0.0–0.1)
Basophils Relative: 2 % — ABNORMAL HIGH (ref 0–1)
Eosinophils Absolute: 0.1 10*3/uL (ref 0.0–0.7)
Eosinophils Relative: 1 % (ref 0–5)
HCT: 41.1 % (ref 36.0–46.0)
Hemoglobin: 13.6 g/dL (ref 12.0–15.0)
Lymphocytes Relative: 66 % — ABNORMAL HIGH (ref 12–46)
Lymphs Abs: 4 10*3/uL (ref 0.7–4.0)
MCH: 26 pg (ref 26.0–34.0)
MCHC: 33.1 g/dL (ref 30.0–36.0)
MCV: 78.6 fL (ref 78.0–100.0)
MPV: 9.1 fL (ref 8.6–12.4)
Monocytes Absolute: 0.6 10*3/uL (ref 0.1–1.0)
Monocytes Relative: 10 % (ref 3–12)
Neutro Abs: 1.3 10*3/uL — ABNORMAL LOW (ref 1.7–7.7)
Neutrophils Relative %: 21 % — ABNORMAL LOW (ref 43–77)
Platelets: 183 10*3/uL (ref 150–400)
RBC: 5.23 MIL/uL — ABNORMAL HIGH (ref 3.87–5.11)
RDW: 16 % — ABNORMAL HIGH (ref 11.5–15.5)
WBC: 6.1 10*3/uL (ref 4.0–10.5)

## 2014-08-22 MED ORDER — OMEPRAZOLE 20 MG PO CPDR: 20.0000 mg | DELAYED_RELEASE_CAPSULE | Freq: Every day | ORAL | Status: DC

## 2014-08-22 NOTE — Assessment & Plan Note (Signed)
Pain location seems different today--more like epigastric Antibiotic made her acutely worse--and with her age, I doubt diverticulitis Antibiotics stopped I am concerned about NSAID gastritis  Will have her stay off the indomethacin PPI trial

## 2014-08-22 NOTE — Patient Instructions (Signed)
Please stop the indomethacin You can take your other medications (but not the antibiotics) Start the omeprazole-- 20mg  daily on an empty stomach.

## 2014-08-22 NOTE — Progress Notes (Signed)
Subjective:    Patient ID: Victoria Wagner, female    DOB: [birth year], 34 y.o.   MRN: [id number]  HPI Reviewed yesterday's note Got worse with the cipro/flagyl--much worse after 2nd dose of these No alcohol  Hurting for 5 days Strong pain on left side--like "stomach griping" May be worse with eating--but not always Sometimes strong pain 30 minutes after eating  Nausea  Having a lot of stress Tried vinegar, ginger, crackers/toast--no help  Points at LMQ/LUQ as pain point  No fever More frequent stools lately-- has gone 2-3 times a day instead of just daily  Current Outpatient Prescriptions on File Prior to Visit  Medication Sig Dispense Refill  . cyclobenzaprine (FLEXERIL) 10 MG tablet Take 10 mg by mouth as needed.    . indomethacin (INDOCIN) 50 MG capsule Take 50 mg by mouth daily.  5  . LORZONE 375 MG TABS Take 375 mg by mouth daily.    . methylPREDNIsolone (MEDROL DOSPACK) 4 MG tablet   0  . nortriptyline (PAMELOR) 10 MG/5ML solution Take 10 mLs (20 mg total) by mouth at bedtime. 120 mL 5  . NUVARING 0.12-0.015 MG/24HR vaginal ring   11  . topiramate (TOPAMAX) 25 MG tablet Take 25 mg by mouth daily.  5  . ciprofloxacin (CIPRO) 500 MG tablet Take 1 tablet (500 mg total) by mouth 2 (two) times daily. (Patient not taking: Reported on 08/22/2014) 14 tablet 0  . metroNIDAZOLE (FLAGYL) 500 MG tablet Take 1 tablet (500 mg total) by mouth 3 (three) times daily. (Patient not taking: Reported on 08/22/2014) 21 tablet 0   No current facility-administered medications on file prior to visit.    No Known Allergies  Past Medical History  Diagnosis Date  . S/P primary low transverse C-section (07/31/12) 08/01/2012  . Postpartum care following cesarean delivery (07/31/12) 08/01/2012  . Pain management   . Cervical radiculopathy   . Myofascial pain   . Degeneration of cervical intervertebral disc   . Cataract     Post traumatic- per patient  . Neuritis   . Occipital headache   .  Brachial neuritis   . Muscle spasm   . Neuralgia   . Myalgia   . Myositis   . Headache     Past Surgical History  Procedure Laterality Date  . Cervical fusion  04/2010    C3-C6  . Ectopic pregnancy surgery  04/2005  . Cesarean section N/A 07/31/2012    Procedure: CESAREAN SECTION;  Surgeon: Lovenia Kim, MD;  Location: [facility];  Service: Obstetrics;  Laterality: N/A;   EDD: 08/18/12    No family history on file.  History   Social History  . Marital Status: Married    Spouse Name: Antyuan   . Number of Children: 1  . Years of Education: BA   Occupational History  . Unemployed    Social History Main Topics  . Smoking status: Never Smoker   . Smokeless tobacco: Not on file  . Alcohol Use: No  . Drug Use: No  . Sexual Activity: Not on file   Other Topics Concern  . Not on file   Social History Narrative   Married   Right handed.   One child, daughter.   Caffeine use: Drinks green tea (Drinks 2-3 times per day)   Enjoys relaxing, spending time with her husband.   Review of Systems Has headache Some heartburn in chest at times--this is different No cough or SOB No periods due to nuva  ring Last intercourse 1-2 months ago Did stop her indomethacin yesterday and held the lorzone    Objective:   Physical Exam  Constitutional: She appears well-developed and well-nourished.  Mild discomfort  Pulmonary/Chest: Effort normal and breath sounds normal. No respiratory distress. She has no wheezes. She has no rales.  Abdominal: Soft. Bowel sounds are normal. She exhibits no distension and no mass. There is tenderness. There is no rebound and no guarding.  Tender mostly epigastric and LUQ Very slight tenderness in LLQ          Assessment & Plan:

## 2014-08-22 NOTE — Progress Notes (Signed)
Pre visit review using our clinic review tool, if applicable. No additional management support is needed unless otherwise documented below in the visit note. 

## 2014-08-24 ENCOUNTER — Telehealth: Payer: Self-pay | Admitting: *Deleted

## 2014-08-24 NOTE — Telephone Encounter (Signed)
PLEASE NOTE: All timestamps contained within this report are represented as [place] Standard Time. CONFIDENTIALTY NOTICE: This fax transmission is intended only for the addressee. It contains information that is legally privileged, confidential or otherwise protected from use or disclosure. If you are not the intended recipient, you are strictly prohibited from reviewing, disclosing, copying using or disseminating any of this information or taking any action in reliance on or regarding this information. If you have received this fax in error, please notify us immediately by telephone so that we can arrange for its return to [place]. Phone: [phone], Toll-Free: [phone], Fax: [phone] Page: 1 of 2 Call Id: [id number] [place] Patient Name: Victoria Wagner Gender: Female DOB: 1980-11-11 Age: 34 Y 50 M 20 D Return Phone Number: [phone] (Primary), [phone] (Secondary) Address: [address] [place] [address] Client [facility] - Client Client Site [place] - Night Contact Type Call Call Type Triage / Clinical Caller Name Alma Friendly, NP Relationship To Patient Self Return Phone Number [phone] (Primary) Chief Complaint Abdominal Pain Initial Comment Caller says she was Dx with diverticulitis yesterday. and was prescribed 2 antibiotics; but she has to stop taking them. She took one pill at 5 pm yesterday and later the other; her ABD pain got much worse, and can't function this AM due to pain. She just realized she had a bad reaction to Flayl in the past [facility] PreDisposition Go to ED Nurse Assessment Nurse: Venetia Maxon, RN, Manuela Schwartz Date/Time (Eastern Time): 08/22/2014 9:35:35 AM Confirm and document reason for call. If symptomatic, describe symptoms. ---Caller says she was Dx with  diverticulitis yesterday. and was prescribed 2 antibiotics; but she has to stop taking them. She took one pill at 5 pm yesterday and later the other; her ABD pain got much worse, and can't function this AM due to pain. She just realized she had a bad reaction to [facility] in the past headache. Current temp no thermometer Has the patient traveled out of the country within the last 30 days? ---No Does the patient require triage? ---Yes Related visit to physician within the last 2 weeks? ---Yes Does the PT have any chronic conditions? (i.e. diabetes, asthma, etc.) ---Yes List chronic conditions. ---chronic pain. takes pain meds topamax indocin Did the patient indicate they were pregnant? ---No Guidelines Guideline Title Affirmed Question Affirmed Notes Nurse Date/Time Eilene Ghazi Time) Abdominal Pain - Female [1] MILD-MODERATE pain AND [2] constant AND [3] present > 2 hours Venetia Maxon, RN, Manuela Schwartz 08/22/2014 9:38:05 AM Disp. Time Eilene Ghazi Time) Disposition Final User PLEASE NOTE: All timestamps contained within this report are represented as [place] Standard Time. CONFIDENTIALTY NOTICE: This fax transmission is intended only for the addressee. It contains information that is legally privileged, confidential or otherwise protected from use or disclosure. If you are not the intended recipient, you are strictly prohibited from reviewing, disclosing, copying using or disseminating any of this information or taking any action in reliance on or regarding this information. If you have received this fax in error, please notify us immediately by telephone so that we can arrange for its return to [place]. Phone: [phone], Toll-Free: [phone], Fax: [phone] Page: 2 of 2 Call Id: [id number] 08/22/2014 9:40:46 AM See Physician within 4 Hours (or PCP triage) Yes Venetia Maxon, RN, Edwena Bunde Understands: Yes Disagree/Comply: Comply Care Advice Given Per Guideline SEE PHYSICIAN WITHIN 4 HOURS (or PCP  triage): REST: Lie down and rest until seen. CALL BACK IF: * You become worse. CARE ADVICE given per Abdominal Pain, Female (Adult) guideline. After Care Instructions Given Call Event Type User Date / Time Description Referrals [facility]

## 2014-08-24 NOTE — Telephone Encounter (Signed)
Spoke with patient this afternoon who reports to be feeling slightly better today. She is to follow up with my on Thursday this week for re-evaluation.

## 2014-08-27 ENCOUNTER — Ambulatory Visit (INDEPENDENT_AMBULATORY_CARE_PROVIDER_SITE_OTHER): Payer: Managed Care, Other (non HMO) | Admitting: Primary Care

## 2014-08-27 ENCOUNTER — Encounter: Payer: Self-pay | Admitting: Primary Care

## 2014-08-27 VITALS — BP 112/78 | HR 80 | Temp 98.2°F | Ht 66.0 in | Wt 205.8 lb

## 2014-08-27 DIAGNOSIS — R1032 Left lower quadrant pain: Secondary | ICD-10-CM

## 2014-08-27 DIAGNOSIS — R109 Unspecified abdominal pain: Secondary | ICD-10-CM | POA: Diagnosis not present

## 2014-08-27 LAB — COMPREHENSIVE METABOLIC PANEL
ALT: 48 U/L — ABNORMAL HIGH (ref 0–35)
AST: 23 U/L (ref 0–37)
Albumin: 3.2 g/dL — ABNORMAL LOW (ref 3.5–5.2)
Alkaline Phosphatase: 130 U/L — ABNORMAL HIGH (ref 39–117)
BUN: 10 mg/dL (ref 6–23)
CO2: 25 mEq/L (ref 19–32)
Calcium: 8.7 mg/dL (ref 8.4–10.5)
Chloride: 107 mEq/L (ref 96–112)
Creatinine, Ser: 1 mg/dL (ref 0.40–1.20)
GFR: 81.79 mL/min (ref >60.00)
Glucose, Bld: 91 mg/dL (ref 70–99)
Potassium: 4 mEq/L (ref 3.5–5.1)
Sodium: 137 mEq/L (ref 135–145)
Total Bilirubin: 0.4 mg/dL (ref 0.2–1.2)
Total Protein: 6.7 g/dL (ref 6.0–8.3)

## 2014-08-27 NOTE — Assessment & Plan Note (Addendum)
Present to LLQ LMQ today.  50% better, but will still experience pain. She' taking omeprazole 20 mg and is not taking indomethacin. Continue omeprazole for next several weeks. May slowly introduce indomethacin in one week. She is to call me with an update on her status. Repeat CMP improved.

## 2014-08-27 NOTE — Progress Notes (Signed)
Subjective:    Patient ID: Victoria Wagner, female    DOB: [birth year], 34 y.o.   MRN: [id number]  HPI  Victoria Wagner is a 34 year old female who presents today for follow up of abdominal pain. She presented on 08/21/14 with left lower quadrant abdominal pain that had been present since 08/17/14. She decribed her pain as sharp, intermittent, with nausea. She was placed on cipro and flagyl for suspected diverticulitis. She then presented to clinic on 4/30 for worsening abdominal pain and was suspected to have gastritis. She was placed on a PPI and removed from antibiotics and indomethacin. CMP returned with elevated liver enzymes including alk phos.   Today she's feeling 50% better than last week. She's taking the prilosec and not taking her indomethacin, as directed. She's able to eat some soup, bread, crackers without pain after eating. She did have some pain yesterday after an egg white omlette. Denies nausea or vomiting, fevers, bloody stools.  Review of Systems  Constitutional: Negative for fever and chills.  Respiratory: Negative for shortness of breath.   Cardiovascular: Negative for chest pain.  Gastrointestinal: Positive for abdominal pain, diarrhea and abdominal distention. Negative for nausea, vomiting, constipation and blood in stool.       Past Medical History  Diagnosis Date  . S/P primary low transverse C-section (07/31/12) 08/01/2012  . Postpartum care following cesarean delivery (07/31/12) 08/01/2012  . Pain management   . Cervical radiculopathy   . Myofascial pain   . Degeneration of cervical intervertebral disc   . Cataract     Post traumatic- per patient  . Neuritis   . Occipital headache   . Brachial neuritis   . Muscle spasm   . Neuralgia   . Myalgia   . Myositis   . Headache     History   Social History  . Marital Status: Married    Spouse Name: Antyuan   . Number of Children: 1  . Years of Education: BA   Occupational History  . Unemployed    Social History  Main Topics  . Smoking status: Never Smoker   . Smokeless tobacco: Not on file  . Alcohol Use: No  . Drug Use: No  . Sexual Activity: Not on file   Other Topics Concern  . Not on file   Social History Narrative   Married   Right handed.   One child, daughter.   Caffeine use: Drinks green tea (Drinks 2-3 times per day)   Enjoys relaxing, spending time with her husband.    Past Surgical History  Procedure Laterality Date  . Cervical fusion  04/2010    C3-C6  . Ectopic pregnancy surgery  04/2005  . Cesarean section N/A 07/31/2012    Procedure: CESAREAN SECTION;  Surgeon: Lovenia Kim, MD;  Location: [facility];  Service: Obstetrics;  Laterality: N/A;   EDD: 08/18/12    No family history on file.  No Known Allergies  Current Outpatient Prescriptions on File Prior to Visit  Medication Sig Dispense Refill  . LORZONE 375 MG TABS Take 375 mg by mouth daily.    . nortriptyline (PAMELOR) 10 MG/5ML solution Take 10 mLs (20 mg total) by mouth at bedtime. 120 mL 5  . NUVARING 0.12-0.015 MG/24HR vaginal ring   11  . omeprazole (PRILOSEC) 20 MG capsule Take 1 capsule (20 mg total) by mouth daily. 30 capsule 3  . topiramate (TOPAMAX) 25 MG tablet Take 25 mg by mouth daily.  5  .  indomethacin (INDOCIN) 50 MG capsule Take 50 mg by mouth 3 (three) times daily.   5   No current facility-administered medications on file prior to visit.    BP 112/78 mmHg  Pulse 80  Temp(Src) 98.2 F (36.8 C) (Oral)  Ht 5' 6" (1.676 m)  Wt 205 lb 12.8 oz (93.35 kg)  BMI 33.23 kg/m2  SpO2 98%    Objective:   Physical Exam  Constitutional: She is oriented to person, place, and time. She appears well-developed. She does not appear ill.  Cardiovascular: Normal rate and regular rhythm.   Pulmonary/Chest: Effort normal and breath sounds normal.  Abdominal: Soft. Bowel sounds are normal. There is no splenomegaly or hepatomegaly. There is tenderness in the left upper quadrant and left lower quadrant. There  is no guarding and negative Murphy's sign.  Neurological: She is alert and oriented to person, place, and time.  Skin: Skin is warm and dry.  Psychiatric: She has a normal mood and affect.          Assessment & Plan:

## 2014-08-27 NOTE — Progress Notes (Signed)
Pre visit review using our clinic review tool, if applicable. No additional management support is needed unless otherwise documented below in the visit note. 

## 2014-08-27 NOTE — Patient Instructions (Addendum)
Continue taking your Prilosec daily.  You may add in your Indomethacin in one-two more weeks. Call me a few days after introducing your indomethacin and let me know how you're feeling. Complete lab work prior to leaving today. I will notify you of your results.  Gastritis, Adult Gastritis is soreness and swelling (inflammation) of the lining of the stomach. Gastritis can develop as a sudden onset (acute) or long-term (chronic) condition. If gastritis is not treated, it can lead to stomach bleeding and ulcers. CAUSES  Gastritis occurs when the stomach lining is weak or damaged. Digestive juices from the stomach then inflame the weakened stomach lining. The stomach lining may be weak or damaged due to viral or bacterial infections. One common bacterial infection is the Helicobacter pylori infection. Gastritis can also result from excessive alcohol consumption, taking certain medicines, or having too much acid in the stomach.  SYMPTOMS  In some cases, there are no symptoms. When symptoms are present, they may include:  Pain or a burning sensation in the upper abdomen.  Nausea.  Vomiting.  An uncomfortable feeling of fullness after eating. DIAGNOSIS  Your caregiver may suspect you have gastritis based on your symptoms and a physical exam. To determine the cause of your gastritis, your caregiver may perform the following:  Blood or stool tests to check for the H pylori bacterium.  Gastroscopy. A thin, flexible tube (endoscope) is passed down the esophagus and into the stomach. The endoscope has a light and camera on the end. Your caregiver uses the endoscope to view the inside of the stomach.  Taking a tissue sample (biopsy) from the stomach to examine under a microscope. TREATMENT  Depending on the cause of your gastritis, medicines may be prescribed. If you have a bacterial infection, such as an H pylori infection, antibiotics may be given. If your gastritis is caused by too much acid in the  stomach, H2 blockers or antacids may be given. Your caregiver may recommend that you stop taking aspirin, ibuprofen, or other nonsteroidal anti-inflammatory drugs (NSAIDs). HOME CARE INSTRUCTIONS  Only take over-the-counter or prescription medicines as directed by your caregiver.  If you were given antibiotic medicines, take them as directed. Finish them even if you start to feel better.  Drink enough fluids to keep your urine clear or pale yellow.  Avoid foods and drinks that make your symptoms worse, such as:  Caffeine or alcoholic drinks.  Chocolate.  Peppermint or mint flavorings.  Garlic and onions.  Spicy foods.  Citrus fruits, such as oranges, lemons, or limes.  Tomato-based foods such as sauce, chili, salsa, and pizza.  Fried and fatty foods.  Eat small, frequent meals instead of large meals. SEEK IMMEDIATE MEDICAL CARE IF:   You have black or dark red stools.  You vomit blood or material that looks like coffee grounds.  You are unable to keep fluids down.  Your abdominal pain gets worse.  You have a fever.  You do not feel better after 1 week.  You have any other questions or concerns. MAKE SURE YOU:  Understand these instructions.  Will watch your condition.  Will get help right away if you are not doing well or get worse. Document Released: 04/04/2001 Document Revised: 10/10/2011 Document Reviewed: 05/24/2011 [facility] Patient Information 2015 [place], [place]. This information is not intended to replace advice given to you by your health care provider. Make sure you discuss any questions you have with your health care provider.

## 2014-10-20 ENCOUNTER — Other Ambulatory Visit: Payer: Self-pay | Admitting: Primary Care

## 2014-10-20 DIAGNOSIS — Z0000 Encounter for general adult medical examination without abnormal findings: Secondary | ICD-10-CM

## 2014-10-29 ENCOUNTER — Other Ambulatory Visit (INDEPENDENT_AMBULATORY_CARE_PROVIDER_SITE_OTHER): Payer: Managed Care, Other (non HMO)

## 2014-10-29 DIAGNOSIS — Z0000 Encounter for general adult medical examination without abnormal findings: Secondary | ICD-10-CM | POA: Diagnosis not present

## 2014-10-29 LAB — COMPREHENSIVE METABOLIC PANEL
ALT: 14 U/L (ref 0–35)
AST: 12 U/L (ref 0–37)
Albumin: 3.5 g/dL (ref 3.5–5.2)
Alkaline Phosphatase: 75 U/L (ref 39–117)
BUN: 9 mg/dL (ref 6–23)
CO2: 24 mEq/L (ref 19–32)
Calcium: 9.1 mg/dL (ref 8.4–10.5)
Chloride: 106 mEq/L (ref 96–112)
Creatinine, Ser: 0.93 mg/dL (ref 0.40–1.20)
GFR: 88.85 mL/min (ref >60.00)
Glucose, Bld: 88 mg/dL (ref 70–99)
Potassium: 3.6 mEq/L (ref 3.5–5.1)
Sodium: 138 mEq/L (ref 135–145)
Total Bilirubin: 0.4 mg/dL (ref 0.2–1.2)
Total Protein: 7.1 g/dL (ref 6.0–8.3)

## 2014-10-29 LAB — CBC
HCT: 38 % (ref 36.0–46.0)
Hemoglobin: 12.6 g/dL (ref 12.0–15.0)
MCHC: 33.2 g/dL (ref 30.0–36.0)
MCV: 80.6 fl (ref 78.0–100.0)
Platelets: 262 10*3/uL (ref 150.0–400.0)
RBC: 4.71 Mil/uL (ref 3.87–5.11)
RDW: 14.7 % (ref 11.5–15.5)
WBC: 5.5 10*3/uL (ref 4.0–10.5)

## 2014-10-29 LAB — TSH: TSH: 1.86 u[IU]/mL (ref 0.35–4.50)

## 2014-10-29 LAB — LIPID PANEL
Cholesterol: 179 mg/dL (ref 0–200)
HDL: 47.1 mg/dL (ref >39.00)
LDL Cholesterol: 111 mg/dL — ABNORMAL HIGH (ref 0–99)
NonHDL: 131.9
Total CHOL/HDL Ratio: 4
Triglycerides: 105 mg/dL (ref 0.0–149.0)
VLDL: 21 mg/dL (ref 0.0–40.0)

## 2014-11-05 ENCOUNTER — Encounter: Payer: Self-pay | Admitting: Primary Care

## 2014-11-05 ENCOUNTER — Ambulatory Visit (INDEPENDENT_AMBULATORY_CARE_PROVIDER_SITE_OTHER): Payer: Managed Care, Other (non HMO) | Admitting: Primary Care

## 2014-11-05 VITALS — BP 114/72 | HR 91 | Temp 97.9°F | Ht 66.0 in | Wt 199.8 lb

## 2014-11-05 DIAGNOSIS — E669 Obesity, unspecified: Secondary | ICD-10-CM | POA: Diagnosis not present

## 2014-11-05 DIAGNOSIS — R51 Headache: Secondary | ICD-10-CM | POA: Diagnosis not present

## 2014-11-05 DIAGNOSIS — G939 Disorder of brain, unspecified: Secondary | ICD-10-CM

## 2014-11-05 DIAGNOSIS — Z0000 Encounter for general adult medical examination without abnormal findings: Secondary | ICD-10-CM | POA: Insufficient documentation

## 2014-11-05 DIAGNOSIS — Z0001 Encounter for general adult medical examination with abnormal findings: Secondary | ICD-10-CM | POA: Insufficient documentation

## 2014-11-05 NOTE — Assessment & Plan Note (Signed)
Tetanus and pap up to date. Labs unremarkable. Weight loss of 6 pounds since last visit. Discussed healthy diet and exercise. Follow up in one year.

## 2014-11-05 NOTE — Progress Notes (Signed)
Pre visit review using our clinic review tool, if applicable. No additional management support is needed unless otherwise documented below in the visit note. 

## 2014-11-05 NOTE — Assessment & Plan Note (Signed)
Weight loss of 6 pounds since last visit. She is working hard to improve her diet. Discussed other ways to improve. Follow up in 6 months for re-evaluation.

## 2014-11-05 NOTE — Progress Notes (Signed)
Subjective:    Patient ID: Victoria Wagner, female    DOB: [birth year], 34 y.o.   MRN: [id number]  HPI  Victoria Wagner is a 34 year old female who presents today for complete physical.  Immunizations: -Tetanus: Believes she received one 2 years ago after birth of her child. -Influenza: Did not receive  Diet: Reports overall healthy. Breakfast: egg white omlete with veggies, green tea. Lunch: Salad with veggies, ranch dressing. Dinner: Meat, veggie, sometimes rice. Snacks: Fruit, yogurt. Occasional sweets. Drinking mostly water with lemon, rarely will have low calorie lemonade. Exercise: Does not exercise but is active with her daughter. Eye exam: Completed last September. Post traumatic cataract 2013. Dental exam: 2 years, is working to re-schedule. Pap Smear: Completed one month ago with breast exam. Normal exams.  Wt Readings from Last 3 Encounters:  11/05/14 199 lb 12.8 oz (90.629 kg)  08/27/14 205 lb 12.8 oz (93.35 kg)  08/22/14 205 lb (92.987 kg)     Review of Systems  Constitutional: Negative for unexpected weight change.  HENT: Negative for rhinorrhea.   Respiratory: Negative for cough and shortness of breath.   Cardiovascular: Negative for chest pain.  Gastrointestinal: Negative for diarrhea and constipation.  Genitourinary: Negative for difficulty urinating.       Switched to [employer]. No complications.   Musculoskeletal: Positive for arthralgias.       Chronic   Skin: Negative for rash.  Neurological: Negative for dizziness and headaches.       Scheduled for injection for headaches Monday.  Psychiatric/Behavioral:       Seeing therapist. Feels stable.       Past Medical History  Diagnosis Date  . S/P primary low transverse C-section (07/31/12) 08/01/2012  . Postpartum care following cesarean delivery (07/31/12) 08/01/2012  . Pain management   . Cervical radiculopathy   . Myofascial pain   . Degeneration of cervical intervertebral disc   . Cataract     Post traumatic-  per patient  . Neuritis   . Occipital headache   . Brachial neuritis   . Muscle spasm   . Neuralgia   . Myalgia   . Myositis   . Headache     History   Social History  . Marital Status: Married    Spouse Name: Antyuan   . Number of Children: 1  . Years of Education: BA   Occupational History  . Unemployed    Social History Main Topics  . Smoking status: Never Smoker   . Smokeless tobacco: Not on file  . Alcohol Use: No  . Drug Use: No  . Sexual Activity: Not on file   Other Topics Concern  . Not on file   Social History Narrative   Married   Right handed.   One child, daughter.   Caffeine use: Drinks green tea (Drinks 2-3 times per day)   Enjoys relaxing, spending time with her husband.    Past Surgical History  Procedure Laterality Date  . Cervical fusion  04/2010    C3-C6  . Ectopic pregnancy surgery  04/2005  . Cesarean section N/A 07/31/2012    Procedure: CESAREAN SECTION;  Surgeon: Lovenia Kim, MD;  Location: [facility];  Service: Obstetrics;  Laterality: N/A;   EDD: 08/18/12    No family history on file.  No Known Allergies  Current Outpatient Prescriptions on File Prior to Visit  Medication Sig Dispense Refill  . indomethacin (INDOCIN) 50 MG capsule Take 50 mg by mouth 3 (three) times  daily.   5  . LORZONE 375 MG TABS Take 375 mg by mouth daily.    . nortriptyline (PAMELOR) 10 MG/5ML solution Take 10 mLs (20 mg total) by mouth at bedtime. 120 mL 5  . NUVARING 0.12-0.015 MG/24HR vaginal ring   11  . omeprazole (PRILOSEC) 20 MG capsule Take 1 capsule (20 mg total) by mouth daily. 30 capsule 3  . topiramate (TOPAMAX) 25 MG tablet Take 25 mg by mouth daily.  5   No current facility-administered medications on file prior to visit.    BP 114/72 mmHg  Pulse 91  Temp(Src) 97.9 F (36.6 C) (Oral)  Ht 5\' 6"  (1.676 m)  Wt 199 lb 12.8 oz (90.629 kg)  BMI 32.26 kg/m2  SpO2 98%    Objective:   Physical Exam  Constitutional: She is oriented to  person, place, and time. She appears well-nourished.  HENT:  Right Ear: Tympanic membrane and ear canal normal.  Left Ear: Tympanic membrane and ear canal normal.  Nose: Nose normal.  Mouth/Throat: Oropharynx is clear and moist.  Eyes: Conjunctivae and EOM are normal. Pupils are equal, round, and reactive to light.  Neck: Neck supple. No thyromegaly present.  Cardiovascular: Normal rate and regular rhythm.   Pulmonary/Chest: Effort normal and breath sounds normal.  Abdominal: Soft. Bowel sounds are normal. There is no tenderness.  Musculoskeletal: She exhibits no edema.  Limited ROM due to chronic joint pain.  Neurological: She is alert and oriented to person, place, and time. She has normal reflexes. No cranial nerve deficit.  Skin: Skin is warm and dry.  Psychiatric: She has a normal mood and affect.          Assessment & Plan:

## 2014-11-05 NOTE — Assessment & Plan Note (Signed)
Stable. Receiving injections. No change in vision.

## 2014-11-05 NOTE — Patient Instructions (Signed)
Your labs look great. Continue your efforts towards a healthy diet.  Follow up in 6 months for re-evaluation of weight loss efforts.  It was nice to see you!

## 2015-03-11 ENCOUNTER — Other Ambulatory Visit: Payer: Self-pay | Admitting: Physical Medicine and Rehabilitation

## 2015-03-11 DIAGNOSIS — R1310 Dysphagia, unspecified: Secondary | ICD-10-CM

## 2015-03-15 ENCOUNTER — Ambulatory Visit
Admission: RE | Admit: 2015-03-15 | Discharge: 2015-03-15 | Disposition: A | Discharge status: Home / Self Care | Payer: Managed Care, Other (non HMO) | Source: Ambulatory Visit | Attending: Physical Medicine and Rehabilitation | Admitting: Physical Medicine and Rehabilitation

## 2015-03-15 DIAGNOSIS — R1310 Dysphagia, unspecified: Secondary | ICD-10-CM

## 2015-09-30 ENCOUNTER — Encounter: Payer: Self-pay | Admitting: Family Medicine

## 2015-09-30 ENCOUNTER — Ambulatory Visit (INDEPENDENT_AMBULATORY_CARE_PROVIDER_SITE_OTHER): Payer: Managed Care, Other (non HMO) | Admitting: Family Medicine

## 2015-09-30 VITALS — BP 120/72 | HR 75 | Temp 98.2°F | Ht 66.0 in | Wt 199.5 lb

## 2015-09-30 DIAGNOSIS — R1030 Lower abdominal pain, unspecified: Secondary | ICD-10-CM

## 2015-09-30 DIAGNOSIS — R350 Frequency of micturition: Secondary | ICD-10-CM | POA: Diagnosis not present

## 2015-09-30 LAB — HEPATIC FUNCTION PANEL
ALT: 15 U/L (ref 0–35)
AST: 12 U/L (ref 0–37)
Albumin: 4 g/dL (ref 3.5–5.2)
Alkaline Phosphatase: 69 U/L (ref 39–117)
Bilirubin, Direct: 0.1 mg/dL (ref 0.0–0.3)
Total Bilirubin: 0.5 mg/dL (ref 0.2–1.2)
Total Protein: 7.1 g/dL (ref 6.0–8.3)

## 2015-09-30 LAB — CBC WITH DIFFERENTIAL/PLATELET
Basophils Absolute: 0 10*3/uL (ref 0.0–0.1)
Basophils Relative: 0.7 % (ref 0.0–3.0)
Eosinophils Absolute: 0.1 10*3/uL (ref 0.0–0.7)
Eosinophils Relative: 1 % (ref 0.0–5.0)
HCT: 38.4 % (ref 36.0–46.0)
Hemoglobin: 12.6 g/dL (ref 12.0–15.0)
Lymphocytes Relative: 38.7 % (ref 12.0–46.0)
Lymphs Abs: 2.6 10*3/uL (ref 0.7–4.0)
MCHC: 32.8 g/dL (ref 30.0–36.0)
MCV: 82.6 fl (ref 78.0–100.0)
Monocytes Absolute: 0.4 10*3/uL (ref 0.1–1.0)
Monocytes Relative: 6.1 % (ref 3.0–12.0)
Neutro Abs: 3.5 10*3/uL (ref 1.4–7.7)
Neutrophils Relative %: 53.5 % (ref 43.0–77.0)
Platelets: 275 10*3/uL (ref 150.0–400.0)
RBC: 4.65 Mil/uL (ref 3.87–5.11)
RDW: 13.9 % (ref 11.5–15.5)
WBC: 6.6 10*3/uL (ref 4.0–10.5)

## 2015-09-30 LAB — POC URINALSYSI DIPSTICK (AUTOMATED)
Bilirubin, UA: NEGATIVE
Blood, UA: NEGATIVE
Glucose, UA: NEGATIVE
Ketones, UA: NEGATIVE
Nitrite, UA: NEGATIVE
Protein, UA: NEGATIVE
Spec Grav, UA: >=1.03
Urobilinogen, UA: 0.2
pH, UA: 6

## 2015-09-30 LAB — BASIC METABOLIC PANEL
BUN: 12 mg/dL (ref 6–23)
CO2: 27 mEq/L (ref 19–32)
Calcium: 9 mg/dL (ref 8.4–10.5)
Chloride: 105 mEq/L (ref 96–112)
Creatinine, Ser: 0.97 mg/dL (ref 0.40–1.20)
GFR: 84.17 mL/min (ref >60.00)
Glucose, Bld: 93 mg/dL (ref 70–99)
Potassium: 3.9 mEq/L (ref 3.5–5.1)
Sodium: 138 mEq/L (ref 135–145)

## 2015-09-30 LAB — POCT URINE PREGNANCY: Preg Test, Ur: NEGATIVE

## 2015-09-30 MED ORDER — SULFAMETHOXAZOLE-TRIMETHOPRIM 400-80 MG PO TABS: 1.0000 | ORAL_TABLET | Freq: Two times a day (BID) | ORAL | Status: DC

## 2015-09-30 NOTE — Progress Notes (Signed)
Dr. Frederico Hamman T. Sherian Valenza, MD, [facility] [place], [address] Phone: [phone] Fax: [phone]  09/30/2015  Patient: Victoria Wagner, MRN: [id number], DOB: October 13, 1980, 35 y.o.  Primary Physician:  Sheral Flow, NP   Chief Complaint  Patient presents with  . Abdominal Pain  . Urinary Frequency   Subjective:   This 35 y.o. female patient presents with urgency. No vaginal discharge or external irritation.  No STD exposure. (married 11 years) Lower abd pain, no flank pain.  Has sepration of the pubic symphysis with childbirth and now has some pain in her lower abdomen. Sometimes will get pain with menses.   Now she is urinating a lot. Suprapubic pain, now has a lot of urgency and some pain.   LMP: on continuous Nuva-Ring.   Small LE   The PMH, PSH, Social History, Family History, Medications, and allergies have been reviewed in [facility], and have been updated if relevant.  Patient Active Problem List   Diagnosis Date Noted  . Preventative health care 11/05/2014  . Abdominal pain, epigastric 08/22/2014  . Obesity (BMI 30.0-34.9) 08/21/2014  . Headache due to intracranial disease 08/06/2014  . Eye pain 08/06/2014  . Fatigue 08/06/2014    Past Medical History  Diagnosis Date  . S/P primary low transverse C-section (07/31/12) 08/01/2012  . Postpartum care following cesarean delivery (07/31/12) 08/01/2012  . Pain management   . Cervical radiculopathy   . Myofascial pain   . Degeneration of cervical intervertebral disc   . Cataract     Post traumatic- per patient  . Neuritis   . Occipital headache   . Brachial neuritis   . Muscle spasm   . Neuralgia   . Myalgia   . Myositis   . Headache     Past Surgical History  Procedure Laterality Date  . Cervical fusion  04/2010    C3-C6  . Ectopic pregnancy surgery  04/2005  . Cesarean section N/A 07/31/2012    Procedure: CESAREAN SECTION;  Surgeon: Lovenia Kim, MD;  Location: [facility];  Service: Obstetrics;  Laterality: N/A;   EDD: 08/18/12    Social History   Social History  . Marital Status: Married    Spouse Name: Antyuan   . Number of Children: 1  . Years of Education: BA   Occupational History  . Unemployed    Social History Main Topics  . Smoking status: Never Smoker   . Smokeless tobacco: Never Used  . Alcohol Use: No  . Drug Use: No  . Sexual Activity: Not on file   Other Topics Concern  . Not on file   Social History Narrative   Married   Right handed.   One child, daughter.   Caffeine use: Drinks green tea (Drinks 2-3 times per day)   Enjoys relaxing, spending time with her husband.    No family history on file.  No Known Allergies  Medication list reviewed and updated in full in [facility].  GEN:  no fevers, chills. GI: No n/v/d, eating normally Otherwise, ROS is as per the HPI.  Objective:   Blood pressure 120/72, pulse 75, temperature 98.2 F (36.8 C), temperature source Oral, height 5\' 6"  (1.676 m), weight 199 lb 8 oz (90.493 kg).  GEN: WDWN, A&Ox4,NAD. Non-toxic HEENT: Atraumatc, normocephalic. CV: RRR, No M/G/R PULM: CTA B, No wheezes, crackles, or rhonchi ABD: S, NT, ND, +BS, no rebound. No CVAT. + suprapubic tenderness. EXT:  No c/c/e  Objective Data: Results for orders placed or performed in visit on 09/30/15  POCT Urinalysis Dipstick (Automated)  Result Value Ref Range   Color, UA yellow    Clarity, UA clear    Glucose, UA negative    Bilirubin, UA negative    Ketones, UA negative    Spec Grav, UA >=1.030    Blood, UA negative    pH, UA 6.0    Protein, UA negative    Urobilinogen, UA 0.2    Nitrite, UA negative    Leukocytes, UA small (1+) (A) Negative  POCT urine pregnancy  Result Value Ref Range   Preg Test, Ur Negative Negative    Assessment and Plan:   Lower abdominal pain - Plan: POCT Urinalysis Dipstick (Automated), POCT urine pregnancy, Basic metabolic panel,  CBC with Differential/Platelet, Hepatic function panel, Urine culture  Urinary frequency  Most likely UTI Treat cx Check additional labs  Follow-up: No Follow-up on file.  New Prescriptions   SULFAMETHOXAZOLE-TRIMETHOPRIM (BACTRIM,SEPTRA) 400-80 MG TABLET    Take 1 tablet by mouth 2 (two) times daily.   Modified Medications   No medications on file   Orders Placed This Encounter  Procedures  . Urine culture  . Basic metabolic panel  . CBC with Differential/Platelet  . Hepatic function panel  . POCT Urinalysis Dipstick (Automated)  . POCT urine pregnancy    Signed,  Tavionna Grout T. Zanetta Dehaan, MD   Patient's Medications  New Prescriptions   SULFAMETHOXAZOLE-TRIMETHOPRIM (BACTRIM,SEPTRA) 400-80 MG TABLET    Take 1 tablet by mouth 2 (two) times daily.  Previous Medications   CYCLOBENZAPRINE (FLEXERIL) 10 MG TABLET       INDOMETHACIN (INDOCIN) 50 MG CAPSULE    Take 50 mg by mouth 3 (three) times daily.    NORTRIPTYLINE (PAMELOR) 10 MG/5ML SOLUTION    Take 10 mLs (20 mg total) by mouth at bedtime.   NUVARING 0.12-0.015 MG/24HR VAGINAL RING       OMEPRAZOLE (PRILOSEC) 20 MG CAPSULE    Take 1 capsule (20 mg total) by mouth daily.   TOPIRAMATE (TOPAMAX) 25 MG TABLET    Take 25 mg by mouth daily.  Modified Medications   No medications on file  Discontinued Medications   LORZONE 375 MG TABS    Take 375 mg by mouth daily.

## 2015-09-30 NOTE — Addendum Note (Signed)
Addended by: Marchia Bond on: 09/30/2015 02:38 PM   Modules accepted: Miquel Dunn

## 2015-09-30 NOTE — Progress Notes (Signed)
Pre visit review using our clinic review tool, if applicable. No additional management support is needed unless otherwise documented below in the visit note. 

## 2015-10-01 LAB — URINE CULTURE: Colony Count: 40000

## 2015-10-21 ENCOUNTER — Encounter: Payer: Self-pay | Admitting: Primary Care

## 2015-10-21 ENCOUNTER — Ambulatory Visit (INDEPENDENT_AMBULATORY_CARE_PROVIDER_SITE_OTHER): Payer: Managed Care, Other (non HMO) | Admitting: Primary Care

## 2015-10-21 VITALS — BP 118/76 | HR 96 | Temp 97.6°F | Ht 66.0 in | Wt 197.8 lb

## 2015-10-21 DIAGNOSIS — E669 Obesity, unspecified: Secondary | ICD-10-CM | POA: Diagnosis not present

## 2015-10-21 NOTE — Patient Instructions (Signed)
Continue to work on your healthy diet. Ensure you are consuming 3-4 servings of vegetables daily.  Increase water consumption to 4-5 bottles daily.  Check out places like [facility]" on [address] in [place], or [facility] in [place] for weight loss management.  It was a pleasure to see you today!
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
## 2015-10-21 NOTE — Progress Notes (Signed)
Pre visit review using our clinic review tool, if applicable. No additional management support is needed unless otherwise documented below in the visit note. 

## 2015-10-21 NOTE — Assessment & Plan Note (Addendum)
Gradual improvement in weight over the last 15 months through improvements in diet. She cannot exercise due to chronic pain and cannot afford to work with the physical therapist. Discussed that I do not prescribe weight loss medications due to risk of abuse and cardiac effects. Highly recommended she try some form of exercise as she would greatly benefit. Also provided her with the names of places locally will prescribe these medications.  Encouraged her to continue working on her diet and start exercising. Commended her on her success thus far and that sometimes continued weight loss slows down.

## 2016-03-13 ENCOUNTER — Encounter: Payer: Self-pay | Admitting: Family

## 2016-03-13 ENCOUNTER — Ambulatory Visit (INDEPENDENT_AMBULATORY_CARE_PROVIDER_SITE_OTHER): Payer: Managed Care, Other (non HMO) | Admitting: Family

## 2016-03-13 ENCOUNTER — Telehealth: Payer: Self-pay | Admitting: Primary Care

## 2016-03-13 VITALS — BP 148/90 | HR 83 | Temp 98.0°F | Ht 65.5 in | Wt 213.2 lb

## 2016-03-13 DIAGNOSIS — N3001 Acute cystitis with hematuria: Secondary | ICD-10-CM

## 2016-03-13 LAB — POCT URINALYSIS DIPSTICK
Bilirubin, UA: NEGATIVE
Glucose, UA: NEGATIVE
Ketones, UA: NEGATIVE
Nitrite, UA: NEGATIVE
Protein, UA: NEGATIVE
Spec Grav, UA: 1.02
Urobilinogen, UA: 0.2
pH, UA: 5.5

## 2016-03-13 MED ORDER — CEPHALEXIN 500 MG PO CAPS: 500.0000 mg | ORAL_CAPSULE | Freq: Two times a day (BID) | ORAL | 0 refills | Status: AC

## 2016-03-13 NOTE — Progress Notes (Signed)
Pre visit review using our clinic review tool, if applicable. No additional management support is needed unless otherwise documented below in the visit note. 

## 2016-03-13 NOTE — Telephone Encounter (Signed)
Pt has appt with Mable Paris FNP on 03/13/16 at 5 PM.

## 2016-03-13 NOTE — Progress Notes (Signed)
Subjective:    Patient ID: Victoria Wagner, female    DOB: 09/30/1980, 35 y.o.   MRN: [id number]  CC: [name] is a 35 y.o. female who presents today for an acute visit.    HPI: CC: Dysuria  And suprapubic pressure , ache 2 days, unchanged. No fevers, chills. No concerns for STDs. Patient did urine test at home which was positive for infection and took pyridium. No recurrent UTIs.    HISTORY:  Past Medical History:  Diagnosis Date  . Brachial neuritis   . Cataract    Post traumatic- per patient  . Cervical radiculopathy   . Degeneration of cervical intervertebral disc   . Headache   . Muscle spasm   . Myalgia   . Myofascial pain   . Myositis   . Neuralgia   . Neuritis   . Occipital headache   . Pain management   . Postpartum care following cesarean delivery (07/31/12) 08/01/2012  . S/P primary low transverse C-section (07/31/12) 08/01/2012   Past Surgical History:  Procedure Laterality Date  . CERVICAL FUSION  04/2010   C3-C6  . CESAREAN SECTION N/A 07/31/2012   Procedure: CESAREAN SECTION;  Surgeon: Lovenia Kim, MD;  Location: [facility];  Service: Obstetrics;  Laterality: N/A;   EDD: 08/18/12  . ECTOPIC PREGNANCY SURGERY  04/2005   No family history on file.  Allergies: Patient has no known allergies. Current Outpatient Prescriptions on File Prior to Visit  Medication Sig Dispense Refill  . cyclobenzaprine (FLEXERIL) 10 MG tablet Take 10 mg by mouth 3 (three) times daily.     . indomethacin (INDOCIN) 50 MG capsule Take 50 mg by mouth 3 (three) times daily.   5  . nortriptyline (PAMELOR) 10 MG/5ML solution Take 10 mLs (20 mg total) by mouth at bedtime. 120 mL 5  . NUVARING 0.12-0.015 MG/24HR vaginal ring Place 1 each vaginally every 28 (twenty-eight) days.   11  . omeprazole (PRILOSEC) 20 MG capsule Take 1 capsule (20 mg total) by mouth daily. 30 capsule 3  . topiramate (TOPAMAX) 25 MG tablet Take 25 mg by mouth daily.  5   No current facility-administered  medications on file prior to visit.     Social History  Substance Use Topics  . Smoking status: Never Smoker  . Smokeless tobacco: Never Used  . Alcohol use No    Review of Systems  Constitutional: Negative for chills and fever.  Respiratory: Negative for cough.   Cardiovascular: Negative for chest pain and palpitations.  Gastrointestinal: Negative for nausea and vomiting.  Genitourinary: Positive for dysuria and frequency. Negative for flank pain.      Objective:    BP (!) 148/90   Pulse 83   Temp 98 F (36.7 C) (Oral)   Ht 5' 5.5" (1.664 m)   Wt 213 lb 3.2 oz (96.7 kg)   SpO2 96%   BMI 34.94 kg/m    Physical Exam  Constitutional: She appears well-developed and well-nourished.  Cardiovascular: Normal rate, regular rhythm, normal heart sounds and normal pulses.   Pulmonary/Chest: Effort normal and breath sounds normal. She has no wheezes. She has no rhonchi. She has no rales.  Abdominal: There is no CVA tenderness.  Neurological: She is alert.  Skin: Skin is warm and dry.  Psychiatric: She has a normal mood and affect. Her speech is normal and behavior is normal. Thought content normal.  Vitals reviewed.      Assessment & Plan:   1. Acute  cystitis with hematuria UA positive for leukocytes and blood. We will treat empirically for UTI. Pending urine culture.   - POCT Urinalysis Dipstick - cephALEXin (KEFLEX) 500 MG capsule; Take 1 capsule (500 mg total) by mouth 2 (two) times daily.  Dispense: 14 capsule; Refill: 0    I am having Ms. Estis start on cephALEXin. I am also having her maintain her NUVARING, indomethacin, topiramate, nortriptyline, omeprazole, and cyclobenzaprine.   Meds ordered this encounter  Medications  . cephALEXin (KEFLEX) 500 MG capsule    Sig: Take 1 capsule (500 mg total) by mouth 2 (two) times daily.    Dispense:  14 capsule    Refill:  0    Order Specific Question:   Supervising Provider    Answer:   Crecencio Mc [[id number]]     Return precautions given.   Risks, benefits, and alternatives of the medications and treatment plan prescribed today were discussed, and patient expressed understanding.   Education regarding symptom management and diagnosis given to patient on AVS.  Continue to follow with Sheral Flow, NP for routine health maintenance.   Legna G Mazur and I agreed with plan.   Victoria Paris, FNP

## 2016-03-13 NOTE — Telephone Encounter (Signed)
Patient Name: Shahidah Branscom  DOB: [birth year]    Initial Comment took UTI test and was positive, took uri-stat med, abdominal pains   Nurse Assessment  Nurse: Raphael Gibney, RN, Vanita Ingles Date/Time (Eastern Time): 03/13/2016 9:40:45 AM  Confirm and document reason for call. If symptomatic, describe symptoms. You must click the next button to save text entered. ---Caller states she was having some lower abd pain on Thursday. Had some burning when she urinates. Home UTI was positive. Took OTC uristat which she started on Friday. Feels about 95% better. Abd pain is pain level 2. No fever or back pain.  Has the patient traveled out of the country within the last 30 days? ---Not Applicable  Does the patient have any new or worsening symptoms? ---Yes  Will a triage be completed? ---Yes  Related visit to physician within the last 2 weeks? ---No  Does the PT have any chronic conditions? (i.e. diabetes, asthma, etc.) ---No  Is the patient pregnant or possibly pregnant? (Ask all females between the ages of 64-55) ---No  Is this a behavioral health or substance abuse call? ---No     Guidelines    Guideline Title Affirmed Question Affirmed Notes  Abdominal Pain - Female [1] MILD (e.g., does not interfere with normal activities) AND [2] pain comes and goes (cramps) AND [3] present > 48 hours    Final Disposition User   See Physician within 24 Hours Ch[place]an, Therapist, sports, Vera    Comments  No appts available at [facility]. Pt does not want to go to urgent care. Please call pt back regarding appt.   Referrals  GO TO FACILITY REFUSED   Disagree/Comply: Disagree  Disagree/Comply Reason: Disagree with instructions

## 2016-03-13 NOTE — Patient Instructions (Signed)
Drink plenty of water and take antibiotic as prescribed.   We are pending the urine culture to know the organism causing the infection and our office will call you with results. If the particular organism requires a different antibiotic than the on prescribed, we will place an order for a new prescription at that time.   If you symptoms worsen or you have new symptoms, please contact our office, or return to clinic for re evaluation.  Urinary Tract Infection Urinary tract infections (UTIs) can develop anywhere along your urinary tract. Your urinary tract is your body's drainage system for removing wastes and extra water. Your urinary tract includes two kidneys, two ureters, a bladder, and a urethra. Your kidneys are a pair of bean-shaped organs. Each kidney is about the size of your fist. They are located below your ribs, one on each side of your spine. CAUSES Infections are caused by microbes, which are microscopic organisms, including fungi, viruses, and bacteria. These organisms are so small that they can only be seen through a microscope. Bacteria are the microbes that most commonly cause UTIs. SYMPTOMS  Symptoms of UTIs may vary by age and gender of the patient and by the location of the infection. Symptoms in young women typically include a frequent and intense urge to urinate and a painful, burning feeling in the bladder or urethra during urination. Older women and men are more likely to be tired, shaky, and weak and have muscle aches and abdominal pain. A fever may mean the infection is in your kidneys. Other symptoms of a kidney infection include pain in your back or sides below the ribs, nausea, and vomiting. DIAGNOSIS To diagnose a UTI, your caregiver will ask you about your symptoms. Your caregiver will also ask you to provide a urine sample. The urine sample will be tested for bacteria and white blood cells. White blood cells are made by your body to help fight infection. TREATMENT    Typically, UTIs can be treated with medication. Because most UTIs are caused by a bacterial infection, they usually can be treated with the use of antibiotics. The choice of antibiotic and length of treatment depend on your symptoms and the type of bacteria causing your infection. HOME CARE INSTRUCTIONS  If you were prescribed antibiotics, take them exactly as your caregiver instructs you. Finish the medication even if you feel better after you have only taken some of the medication.  Drink enough water and fluids to keep your urine clear or pale yellow.  Avoid caffeine, tea, and carbonated beverages. They tend to irritate your bladder.  Empty your bladder often. Avoid holding urine for long periods of time.  Empty your bladder before and after sexual intercourse.  After a bowel movement, women should cleanse from front to back. Use each tissue only once. SEEK MEDICAL CARE IF:   You have back pain.  You develop a fever.  Your symptoms do not begin to resolve within 3 days. SEEK IMMEDIATE MEDICAL CARE IF:   You have severe back pain or lower abdominal pain.  You develop chills.  You have nausea or vomiting.  You have continued burning or discomfort with urination. MAKE SURE YOU:   Understand these instructions.  Will watch your condition.  Will get help right away if you are not doing well or get worse.   This information is not intended to replace advice given to you by your health care provider. Make sure you discuss any questions you have with your health care   provider.   Document Released: 01/18/2005 Document Revised: 12/30/2014 Document Reviewed: 05/19/2011 Elsevier Interactive Patient Education 2016 Elsevier Inc.    

## 2016-03-14 NOTE — Progress Notes (Signed)
Left a message for patient to return call back

## 2016-03-21 NOTE — Progress Notes (Signed)
Patient has been informed.

## 2016-03-27 ENCOUNTER — Telehealth: Payer: Self-pay | Admitting: Primary Care

## 2016-03-27 NOTE — Telephone Encounter (Signed)
Patient Name: Victoria Wagner  DOB: 07-Oct-1980    Initial Comment Caller states she has a UTI. Still having abdominal pain.    Nurse Assessment  Nurse: Raphael Gibney, RN, Vanita Ingles Date/Time (Eastern Time): 03/27/2016 11:12:53 AM  Confirm and document reason for call. If symptomatic, describe symptoms. ---Caller states she has a Nuvaring. Has changed her nuva ring which did not help the abd pain. Completed antibiotics for UTI. Has a little irritation. Has lower abd pain. She is drinking cranberry juice. Finished antibiotics last week.  Does the patient have any new or worsening symptoms? ---Yes  Will a triage be completed? ---Yes  Related visit to physician within the last 2 weeks? ---Yes  Does the PT have any chronic conditions? (i.e. diabetes, asthma, etc.) ---Yes  List chronic conditions. ---pelvic issue  Is the patient pregnant or possibly pregnant? (Ask all females between the ages of 44-55) ---No  Is this a behavioral health or substance abuse call? ---No     Guidelines    Guideline Title Affirmed Question Affirmed Notes  Abdominal Pain - Female [1] MILD-MODERATE pain AND [2] constant AND [3] present > 2 hours    Final Disposition User   See Physician within 4 Hours (or PCP triage) Raphael Gibney, RN, Vera    Comments  Pt is on a fixed income. She is wanting to know if an antibiotic can be called in rather than coming to the office. Please call pt back and let her know about medication.   Referrals  GO TO FACILITY REFUSED   Disagree/Comply: Disagree  Disagree/Comply Reason: Disagree with instructions

## 2016-03-27 NOTE — Telephone Encounter (Signed)
Spoken to patient and schedule appointment on 03/28/2016

## 2016-03-27 NOTE — Telephone Encounter (Signed)
She needs to be re-evaluated in the office. No culture seen in system from last office visit.

## 2016-03-27 NOTE — Telephone Encounter (Signed)
Please see team health note and advise.

## 2016-03-28 ENCOUNTER — Ambulatory Visit (INDEPENDENT_AMBULATORY_CARE_PROVIDER_SITE_OTHER): Payer: Managed Care, Other (non HMO) | Admitting: Primary Care

## 2016-03-28 ENCOUNTER — Encounter: Payer: Self-pay | Admitting: Primary Care

## 2016-03-28 VITALS — BP 124/62 | HR 94 | Temp 97.6°F | Wt 211.8 lb

## 2016-03-28 DIAGNOSIS — R102 Pelvic and perineal pain: Secondary | ICD-10-CM

## 2016-03-28 DIAGNOSIS — R109 Unspecified abdominal pain: Secondary | ICD-10-CM | POA: Diagnosis not present

## 2016-03-28 LAB — POC URINALSYSI DIPSTICK (AUTOMATED)
Bilirubin, UA: NEGATIVE
Blood, UA: NEGATIVE
Glucose, UA: NEGATIVE
Ketones, UA: NEGATIVE
Nitrite, UA: NEGATIVE
Protein, UA: NEGATIVE
Spec Grav, UA: >=1.03
Urobilinogen, UA: NEGATIVE
pH, UA: 6

## 2016-03-28 NOTE — Progress Notes (Signed)
Pre visit review using our clinic review tool, if applicable. No additional management support is needed unless otherwise documented below in the visit note. 

## 2016-03-28 NOTE — Patient Instructions (Signed)
We will notify you once we've received your vaginal specimen and urine culture results.   Stop by the front desk and speak with either Rosaria Ferries or Shirlean Mylar regarding your ultrasound. I will notify you of these results once received.  Continue to increase consumption of water to 64 ounces daily.  It was a pleasure to see you today!

## 2016-03-28 NOTE — Progress Notes (Signed)
Subjective:    Patient ID: Victoria Wagner, female    DOB: 1981/02/27, 35 y.o.   MRN: [id number]  HPI  Victoria Wagner is a 35 year old female who presents today with a chief complaint of suprapubic pain. She was evaluated on 03/13/16 with a chief complaint of a 2 day history of dysuria and suprapubic pressure. Her urinalysis that day was with 2+ leuks, negative nitrites, moderate blood. She was treated for presumed UTI with a seven day course of cephalexin.   Since that visit she continues to experience suprapubic pressure/pain. Her last UTI was in June 2017.She denies vaginal discharge, vaginal itching, hematuria, abdominal pain, groin pain, fevers, chills, flank pain. She does have a history of pubic symphysis pain that has been intermittently present since the birth of her daughter. She drinks 3-4 bottles of water daily, and has recently been drinking lots of cranberry juice. She's been using panty liners as she has noticed increased vaginal wetness for the past several months.    Review of Systems  Constitutional: Negative for fever.  Gastrointestinal: Negative for abdominal pain and nausea.  Genitourinary: Positive for pelvic pain. Negative for difficulty urinating, dysuria, frequency, genital sores, menstrual problem, vaginal bleeding and vaginal discharge.       Past Medical History:  Diagnosis Date  . Brachial neuritis   . Cataract    Post traumatic- per patient  . Cervical radiculopathy   . Degeneration of cervical intervertebral disc   . Headache   . Muscle spasm   . Myalgia   . Myofascial pain   . Myositis   . Neuralgia   . Neuritis   . Occipital headache   . Pain management   . Postpartum care following cesarean delivery (07/31/12) 08/01/2012  . S/P primary low transverse C-section (07/31/12) 08/01/2012     Social History   Social History  . Marital status: Married    Spouse name: Victoria Wagner   . Number of children: 1  . Years of education: BA   Occupational History  .  Unemployed    Social History Main Topics  . Smoking status: Never Smoker  . Smokeless tobacco: Never Used  . Alcohol use No  . Drug use: No  . Sexual activity: Not on file   Other Topics Concern  . Not on file   Social History Narrative   Married   Right handed.   One child, daughter.   Caffeine use: Drinks green tea (Drinks 2-3 times per day)   Enjoys relaxing, spending time with her husband.    Past Surgical History:  Procedure Laterality Date  . CERVICAL FUSION  04/2010   C3-C6  . CESAREAN SECTION N/A 07/31/2012   Procedure: CESAREAN SECTION;  Surgeon: Victoria Kim, MD;  Location: [facility];  Service: Obstetrics;  Laterality: N/A;   EDD: 08/18/12  . ECTOPIC PREGNANCY SURGERY  04/2005    No family history on file.  No Known Allergies  Current Outpatient Prescriptions on File Prior to Visit  Medication Sig Dispense Refill  . cyclobenzaprine (FLEXERIL) 10 MG tablet Take 10 mg by mouth 3 (three) times daily.     . indomethacin (INDOCIN) 50 MG capsule Take 50 mg by mouth 3 (three) times daily.   5  . nortriptyline (PAMELOR) 10 MG/5ML solution Take 10 mLs (20 mg total) by mouth at bedtime. 120 mL 5  . NUVARING 0.12-0.015 MG/24HR vaginal ring Place 1 each vaginally every 28 (twenty-eight) days.   11  . omeprazole (PRILOSEC) 20  MG capsule Take 1 capsule (20 mg total) by mouth daily. 30 capsule 3  . topiramate (TOPAMAX) 25 MG tablet Take 25 mg by mouth daily.  5   No current facility-administered medications on file prior to visit.     BP 124/62   Pulse 94   Temp 97.6 F (36.4 C) (Oral)   Wt 211 lb 12 oz (96 kg)   SpO2 98%   BMI 34.70 kg/m    Objective:   Physical Exam  Constitutional: She appears well-nourished. She does not appear ill.  Neck: Neck supple.  Cardiovascular: Normal rate and regular rhythm.   Pulmonary/Chest: Effort normal and breath sounds normal.  Abdominal: Soft. Bowel sounds are normal. There is tenderness in the suprapubic area. There is no CVA  tenderness.  Skin: Skin is warm and dry.          Assessment & Plan:  Suprapubic Pain:  Treated for UTI in late November 2017, completed antibiotics, doesn't feel much improvement. Exam today with bilateral groin pain, suprapubic pain. No indication for acute pyelonephritis. Does not appear acutely ill.  UA: 2+ leuks, negative blood, negative nitrites Culture sent. Will await for results. Wet Prep completed and is pending today.  Given examination and symptoms, will order pelvic and transvaginal ultrasound. Discussed to increase water consumption to 64 ounces daily.  Victoria Flow, NP

## 2016-03-29 ENCOUNTER — Other Ambulatory Visit: Payer: Self-pay | Admitting: Primary Care

## 2016-03-29 DIAGNOSIS — B373 Candidiasis of vulva and vagina: Secondary | ICD-10-CM

## 2016-03-29 DIAGNOSIS — B3731 Acute candidiasis of vulva and vagina: Secondary | ICD-10-CM

## 2016-03-29 LAB — URINE CULTURE

## 2016-03-29 LAB — WET PREP BY MOLECULAR PROBE
Candida species: POSITIVE — AB
Gardnerella vaginalis: NEGATIVE
Trichomonas vaginosis: NEGATIVE

## 2016-03-29 MED ORDER — FLUCONAZOLE 150 MG PO TABS: ORAL_TABLET | ORAL | 0 refills | Status: DC

## 2016-03-30 ENCOUNTER — Telehealth: Payer: Self-pay | Admitting: Primary Care

## 2016-03-30 NOTE — Telephone Encounter (Signed)
Please see result note. Spoken and notified patient of Kate's comments. Patient verbalized understanding.

## 2016-03-30 NOTE — Telephone Encounter (Signed)
Pt returned your call - please call [phone] Thanks

## 2016-04-03 ENCOUNTER — Ambulatory Visit: Payer: Managed Care, Other (non HMO)

## 2016-12-12 ENCOUNTER — Ambulatory Visit (INDEPENDENT_AMBULATORY_CARE_PROVIDER_SITE_OTHER): Payer: Managed Care, Other (non HMO) | Admitting: Family

## 2016-12-12 ENCOUNTER — Other Ambulatory Visit: Payer: Managed Care, Other (non HMO)

## 2016-12-12 ENCOUNTER — Encounter: Payer: Self-pay | Admitting: Family

## 2016-12-12 VITALS — BP 136/86 | HR 78 | Temp 98.4°F | Resp 16 | Ht 65.5 in | Wt 207.8 lb

## 2016-12-12 DIAGNOSIS — R102 Pelvic and perineal pain: Secondary | ICD-10-CM | POA: Insufficient documentation

## 2016-12-12 LAB — WET PREP, GENITAL
Clue Cells Wet Prep HPF POC: NONE SEEN — AB
Trich, Wet Prep: NONE SEEN — AB
Yeast Wet Prep HPF POC: NONE SEEN — AB

## 2016-12-12 LAB — POCT URINALYSIS DIPSTICK
Bilirubin, UA: NEGATIVE
Blood, UA: NEGATIVE
Glucose, UA: NEGATIVE
Ketones, UA: NEGATIVE
Nitrite, UA: NEGATIVE
Protein, UA: NEGATIVE
Spec Grav, UA: >=1.03 — AB (ref 1.010–1.025)
Urobilinogen, UA: NEGATIVE E.U./dL — AB
pH, UA: 6 (ref 5.0–8.0)

## 2016-12-12 MED ORDER — FLUCONAZOLE 150 MG PO TABS: ORAL_TABLET | ORAL | 0 refills | Status: DC

## 2016-12-12 NOTE — Patient Instructions (Addendum)
Thank you for choosing [facility].  SUMMARY AND INSTRUCTIONS:  Please start the fluconazole.  We will check your lab work today.  If your symptoms do not improve follow up with your PCP.   Medication:  Your prescription(s) have been submitted to your pharmacy or been printed and provided for you. Please take as directed and contact our office if you believe you are having problem(s) with the medication(s) or have any questions.  Follow up:  If your symptoms worsen or fail to improve, please contact our office for further instruction, or in case of emergency go directly to the emergency room at the closest medical facility.

## 2016-12-12 NOTE — Addendum Note (Signed)
Addended by: Delice Bison E on: 12/12/2016 04:51 PM   Modules accepted: Orders

## 2016-12-12 NOTE — Addendum Note (Signed)
Addended by: Delice Bison E on: 12/12/2016 04:44 PM   Modules accepted: Orders

## 2016-12-12 NOTE — Addendum Note (Signed)
Addended by: Delice Bison E on: 12/12/2016 04:57 PM   Modules accepted: Orders

## 2016-12-12 NOTE — Assessment & Plan Note (Signed)
In office urinalysis positive for leukocytes and negative for nitrites and hematuria. She has had previous positive leukocytes noted on urine with no urinary symptoms consistent with asymptomatic bacteriuria. No pelvic pain able to be elicited on physical exam. Wet prep obtained. Start fluconazole. Follow-up pending wet prep or if symptoms worsen or do not improve.

## 2016-12-12 NOTE — Addendum Note (Signed)
Addended by: Delice Bison E on: 12/12/2016 04:48 PM   Modules accepted: Orders

## 2016-12-12 NOTE — Progress Notes (Signed)
Subjective:    Patient ID: Victoria Wagner, female    DOB: 04/12/1981, 36 y.o.   MRN: [id number]  Chief Complaint  Patient presents with  . Abdominal Pain    states that she is having abdominal and pelvic pain, vaginal irritation, no discharge, no dysuria     HPI:  Victoria Wagner is a 36 y.o. female who  has a past medical history of Brachial neuritis; Cataract; Cervical radiculopathy; Degeneration of cervical intervertebral disc; Headache; Muscle spasm; Myalgia; Myofascial pain; Myositis; Neuralgia; Neuritis; Occipital headache; Pain management; Postpartum care following cesarean delivery (07/31/12) (08/01/2012); and S/P primary low transverse C-section (07/31/12) (08/01/2012). and presents today for an acute office visit.   This is a new problem. Associated symptoms abdominal and pelvic pain and vaginal irritation has been going on for about 1 week that has generally waxed and waned. Does have chronic pelvic pain. Denies discharge or dysuria. Has felt like this previously and diagnosed with a yeast infection. No fevers. Denies nausea, vomiting, diarrhea, or constipation. Course of the symptoms has stayed about the same. Did take her Nuvaring out yesterday.   No Known Allergies    Outpatient Medications Prior to Visit  Medication Sig Dispense Refill  . cyclobenzaprine (FLEXERIL) 10 MG tablet Take 10 mg by mouth 3 (three) times daily.     . indomethacin (INDOCIN) 50 MG capsule Take 50 mg by mouth 3 (three) times daily.   5  . nortriptyline (PAMELOR) 10 MG/5ML solution Take 10 mLs (20 mg total) by mouth at bedtime. 120 mL 5  . NUVARING 0.12-0.015 MG/24HR vaginal ring Place 1 each vaginally every 28 (twenty-eight) days.   11  . omeprazole (PRILOSEC) 20 MG capsule Take 1 capsule (20 mg total) by mouth daily. 30 capsule 3  . topiramate (TOPAMAX) 25 MG tablet Take 25 mg by mouth daily.  5  . fluconazole (DIFLUCAN) 150 MG tablet Take 1 tablet by mouth once. May repeat in 3 days if no resolve. 2  tablet 0   No facility-administered medications prior to visit.     Past Medical History:  Diagnosis Date  . Brachial neuritis   . Cataract    Post traumatic- per patient  . Cervical radiculopathy   . Degeneration of cervical intervertebral disc   . Headache   . Muscle spasm   . Myalgia   . Myofascial pain   . Myositis   . Neuralgia   . Neuritis   . Occipital headache   . Pain management   . Postpartum care following cesarean delivery (07/31/12) 08/01/2012  . S/P primary low transverse C-section (07/31/12) 08/01/2012    Review of Systems  Constitutional: Negative for chills and fever.  Genitourinary: Positive for pelvic pain and vaginal pain. Negative for decreased urine volume, dysuria, flank pain, frequency, genital sores, hematuria, menstrual problem, urgency, vaginal bleeding and vaginal discharge.      Objective:    BP 136/86 (BP Location: Left Arm, Patient Position: Sitting, Cuff Size: Large)   Pulse 78   Temp 98.4 F (36.9 C) (Oral)   Resp 16   Ht 5' 5.5" (1.664 m)   Wt 207 lb 12.8 oz (94.3 kg)   SpO2 97%   BMI 34.05 kg/m  Nursing note and vital signs reviewed.  Physical Exam  Constitutional: She is oriented to person, place, and time. She appears well-developed and well-nourished. No distress.  Cardiovascular: Normal rate, regular rhythm, normal heart sounds and intact distal pulses.   Pulmonary/Chest: Effort normal and breath  sounds normal.  Abdominal: She exhibits no distension and no mass. There is no tenderness. There is no rebound and no guarding.  Neurological: She is alert and oriented to person, place, and time.  Skin: Skin is warm and dry.  Psychiatric: She has a normal mood and affect. Her behavior is normal. Judgment and thought content normal.       Assessment & Plan:   Problem List Items Addressed This Visit      Other   Pelvic pain - Primary    In office urinalysis positive for leukocytes and negative for nitrites and hematuria. She has had  previous positive leukocytes noted on urine with no urinary symptoms consistent with asymptomatic bacteriuria. No pelvic pain able to be elicited on physical exam. Wet prep obtained. Start fluconazole. Follow-up pending wet prep or if symptoms worsen or do not improve.      Relevant Medications   fluconazole (DIFLUCAN) 150 MG tablet   Other Relevant Orders   POCT urinalysis dipstick (Completed)   Urine Culture       I am having Ms. Dalgleish maintain her NUVARING, indomethacin, topiramate, nortriptyline, omeprazole, cyclobenzaprine, and fluconazole.   Meds ordered this encounter  Medications  . fluconazole (DIFLUCAN) 150 MG tablet    Sig: Take 1 tablet by mouth once. May repeat in 3 days if no resolve.    Dispense:  2 tablet    Refill:  0    Order Specific Question:   Supervising Provider    Answer:   Pricilla Holm A [[id number]]     Follow-up: Return if symptoms worsen or fail to improve.  Mauricio Po, FNP

## 2016-12-14 ENCOUNTER — Telehealth: Payer: Self-pay | Admitting: Family

## 2016-12-14 DIAGNOSIS — R102 Pelvic and perineal pain: Secondary | ICD-10-CM

## 2016-12-14 LAB — URINE CULTURE

## 2016-12-14 NOTE — Telephone Encounter (Signed)
Pt called wanting results from her urinalysis on 8/21 Please advise and call back

## 2016-12-15 ENCOUNTER — Other Ambulatory Visit (INDEPENDENT_AMBULATORY_CARE_PROVIDER_SITE_OTHER): Payer: Managed Care, Other (non HMO)

## 2016-12-15 DIAGNOSIS — R102 Pelvic and perineal pain: Secondary | ICD-10-CM | POA: Diagnosis not present

## 2016-12-15 LAB — URINALYSIS, ROUTINE W REFLEX MICROSCOPIC
Bilirubin Urine: NEGATIVE
Hgb urine dipstick: NEGATIVE
Ketones, ur: NEGATIVE
Nitrite: NEGATIVE
RBC / HPF: NONE SEEN (ref 0–?)
Specific Gravity, Urine: 1.025 (ref 1.000–1.030)
Urine Glucose: NEGATIVE
Urobilinogen, UA: 0.2 (ref 0.0–1.0)
pH: 6 (ref 5.0–8.0)

## 2016-12-15 MED ORDER — NITROFURANTOIN MONOHYD MACRO 100 MG PO CAPS: 100.0000 mg | ORAL_CAPSULE | Freq: Two times a day (BID) | ORAL | 0 refills | Status: DC

## 2016-12-15 NOTE — Telephone Encounter (Signed)
Antibiotic sent to [facility] 

## 2016-12-15 NOTE — Telephone Encounter (Signed)
Patient came in stating she was heading down to the lab to give urine.  States she needs antibiotics sent to [facility] in [place] today b/c she is going out of town on Sunday.  Patient can be reached back at [phone].

## 2016-12-15 NOTE — Telephone Encounter (Signed)
LVM letting pt know rx has been sent to pharmacy.

## 2016-12-15 NOTE — Telephone Encounter (Signed)
Having some abdominal discomfort. Will come by to give another sample but she is asking if something for UTI can be sent in. Please advise if you are willing.

## 2016-12-15 NOTE — Telephone Encounter (Signed)
It was not a good sample and had more than one bacteria.  If she is still having concerning symptoms she should give another sample - needs to be a sterile sample

## 2016-12-15 NOTE — Telephone Encounter (Signed)
Please advise in [name]'s absence.

## 2016-12-16 LAB — URINE CULTURE

## 2016-12-29 ENCOUNTER — Ambulatory Visit (INDEPENDENT_AMBULATORY_CARE_PROVIDER_SITE_OTHER): Payer: Managed Care, Other (non HMO) | Admitting: Primary Care

## 2016-12-29 VITALS — BP 120/82 | HR 88 | Temp 98.4°F | Ht 65.5 in | Wt 205.4 lb

## 2016-12-29 DIAGNOSIS — R51 Headache: Secondary | ICD-10-CM

## 2016-12-29 DIAGNOSIS — J069 Acute upper respiratory infection, unspecified: Secondary | ICD-10-CM

## 2016-12-29 DIAGNOSIS — R197 Diarrhea, unspecified: Secondary | ICD-10-CM | POA: Diagnosis not present

## 2016-12-29 DIAGNOSIS — R519 Headache, unspecified: Secondary | ICD-10-CM

## 2016-12-29 MED ORDER — KETOROLAC TROMETHAMINE 60 MG/2ML IM SOLN
60.0000 mg | Freq: Once | INTRAMUSCULAR | Status: AC
  Administered 2016-12-29: 60 mg via INTRAMUSCULAR

## 2016-12-29 NOTE — Addendum Note (Signed)
Addended by: Jacqualin Combes on: 12/29/2016 12:21 PM   Modules accepted: Orders

## 2016-12-29 NOTE — Progress Notes (Signed)
Subjective:    Patient ID: Victoria Wagner, female    DOB: [birth year], 36 y.o.   MRN: [id number]  HPI  Victoria Wagner is a 36 year old female who presents today with a chief complaint of URI symptoms. She reports fatigue, cough, headache, sinus pressure. She caught a cold from her daughter with symptoms beginning 5-6 days ago. She was evaluated on 12/15/16 for pelvic pressure and perineal irritation, diagnosed with UTI and prescribed Cipro. She also started taking Theraflu that same day. Just shortly after, four days ago, she noticed diarrhea, abdominal cramping, dizziness, headaches, and nausea. Her diarrhea stopped two days ago. She denies fevers. She's not been hydrating much with water, her appetite has been decreased. Overall she's feeling better except for a moderate headache behind her right eye. Her pelvic pressure has improved since treatment with Cipro. She's been compliant to her pain medication and Topamax without much relief.  Review of Systems  Constitutional: Negative for chills and fever.  HENT: Negative for congestion.   Eyes: Positive for photophobia.  Respiratory: Positive for cough.   Gastrointestinal: Positive for nausea. Negative for abdominal pain and diarrhea.  Neurological: Positive for headaches.       Past Medical History:  Diagnosis Date  . Brachial neuritis   . Cataract    Post traumatic- per patient  . Cervical radiculopathy   . Degeneration of cervical intervertebral disc   . Headache   . Muscle spasm   . Myalgia   . Myofascial pain   . Myositis   . Neuralgia   . Neuritis   . Occipital headache   . Pain management   . Postpartum care following cesarean delivery (07/31/12) 08/01/2012  . S/P primary low transverse C-section (07/31/12) 08/01/2012     Social History   Social History  . Marital status: Married    Spouse name: Antyuan   . Number of children: 1  . Years of education: BA   Occupational History  . Unemployed    Social History Main Topics    . Smoking status: Never Smoker  . Smokeless tobacco: Never Used  . Alcohol use No  . Drug use: No  . Sexual activity: Not on file   Other Topics Concern  . Not on file   Social History Narrative   Married   Right handed.   One child, daughter.   Caffeine use: Drinks green tea (Drinks 2-3 times per day)   Enjoys relaxing, spending time with her husband.    Past Surgical History:  Procedure Laterality Date  . CERVICAL FUSION  04/2010   C3-C6  . CESAREAN SECTION N/A 07/31/2012   Procedure: CESAREAN SECTION;  Surgeon: Lovenia Kim, MD;  Location: [facility];  Service: Obstetrics;  Laterality: N/A;   EDD: 08/18/12  . ECTOPIC PREGNANCY SURGERY  04/2005    No family history on file.  No Known Allergies  Current Outpatient Prescriptions on File Prior to Visit  Medication Sig Dispense Refill  . indomethacin (INDOCIN) 50 MG capsule Take 50 mg by mouth 3 (three) times daily.   5  . nortriptyline (PAMELOR) 10 MG/5ML solution Take 10 mLs (20 mg total) by mouth at bedtime. 120 mL 5  . NUVARING 0.12-0.015 MG/24HR vaginal ring Place 1 each vaginally every 28 (twenty-eight) days.   11  . omeprazole (PRILOSEC) 20 MG capsule Take 1 capsule (20 mg total) by mouth daily. 30 capsule 3  . topiramate (TOPAMAX) 25 MG tablet Take 25 mg by mouth daily.  5  . cyclobenzaprine (FLEXERIL) 10 MG tablet Take 10 mg by mouth 3 (three) times daily.      No current facility-administered medications on file prior to visit.     BP 120/82   Pulse 88   Temp 98.4 F (36.9 C) (Oral)   Ht 5' 5.5" (1.664 m)   Wt 205 lb 6.4 oz (93.2 kg)   SpO2 98%   BMI 33.66 kg/m    Objective:   Physical Exam  Constitutional: She appears well-nourished.  HENT:  Right Ear: Tympanic membrane and ear canal normal.  Left Ear: Tympanic membrane and ear canal normal.  Nose: Right sinus exhibits no maxillary sinus tenderness and no frontal sinus tenderness. Left sinus exhibits no maxillary sinus tenderness and no frontal sinus  tenderness.  Mouth/Throat: Oropharynx is clear and moist.  Eyes: Conjunctivae are normal.  Neck: Neck supple.  Cardiovascular: Normal rate and regular rhythm.   Pulmonary/Chest: Effort normal and breath sounds normal. She has no wheezes. She has no rales.  Abdominal: Soft. Bowel sounds are normal. There is no tenderness.  Lymphadenopathy:    She has no cervical adenopathy.  Skin: Skin is warm and dry.          Assessment & Plan:  URI with Viral Involvement:  URI symptoms nearly resolved.  Pelvic pressure improved after Cipro. Suspect GI symptoms related to viral involvement, exam today reassuring. Discussed importance of proper hydration and to advance diet as tolerated. IM Toradol provided for headache today.  She will update if GI symptoms return, consider labs at that time.  Sheral Flow, NP

## 2016-12-29 NOTE — Patient Instructions (Addendum)
You were provided with an injection of Toradol today for your headache.   Work on hydration with water and advance your diet as tolerated.  Please call us if your diarrhea returns. Also call if you start running fevers over 101.5.  It was a pleasure to see you today!

## 2017-03-26 ENCOUNTER — Telehealth: Payer: Self-pay

## 2017-03-26 ENCOUNTER — Ambulatory Visit: Payer: Managed Care, Other (non HMO) | Admitting: Family Medicine

## 2017-03-26 NOTE — Telephone Encounter (Signed)
Patient did call with 24 hr notice to cancel appointment; however, did not process message from [facility] until after time.  Please do not charge.

## 2017-03-26 NOTE — Telephone Encounter (Signed)
Client [facility] - Client Client Site [facility] [facility] - PHYSICIAN, Verita Schneiders- MD Contact Type Call Who Is Calling Patient / Member / Family / Caregiver Caller Name Carrolyn Hilmes Phone Number [phone] Patient Name Victoria Wagner Call Type Message Only Information Provided Reason for Call Request to [facility] Appointment Initial Comment Caller states that she needs to cancel her appt Additional Comment Call Closed By: Roosevelt Locks Transaction Date/Time: 03/25/2017 10:33:32 AM (ET)

## 2017-06-30 ENCOUNTER — Ambulatory Visit: Payer: Managed Care, Other (non HMO) | Admitting: Internal Medicine

## 2017-06-30 ENCOUNTER — Encounter: Payer: Self-pay | Admitting: Internal Medicine

## 2017-06-30 VITALS — BP 126/88 | HR 92 | Temp 97.7°F | Ht 65.5 in | Wt 210.0 lb

## 2017-06-30 DIAGNOSIS — R109 Unspecified abdominal pain: Secondary | ICD-10-CM | POA: Insufficient documentation

## 2017-06-30 DIAGNOSIS — R1012 Left upper quadrant pain: Secondary | ICD-10-CM | POA: Diagnosis not present

## 2017-06-30 MED ORDER — CIPROFLOXACIN HCL 500 MG PO TABS: 500.0000 mg | ORAL_TABLET | Freq: Two times a day (BID) | ORAL | 0 refills | Status: DC

## 2017-06-30 MED ORDER — OXCARBAZEPINE ER 600 MG PO TB24: ORAL_TABLET | ORAL | 0 refills | Status: DC

## 2017-06-30 NOTE — Progress Notes (Signed)
Subjective:    Patient ID: Victoria Wagner, female    DOB: Nov 25, 1980, 37 y.o.   MRN: [id number]  HPI  Here with acute visit for 2 wks mild epigastric pain, but moderate left upper quadrant assoc with bowel frequency and loose stools, non bloody. Having incresaed BM's twice per day instead of 1, loose but not water.  Griping and churning, sharp sometimes, passes gas/flatus to feel better. No fever, n/v but lower appetite, mostly crackers and ginger ale. Off nsaids for at least 82mo,  No ETOH, no illicit drugs.  No recent antibiotic exposure.  Denies worsening reflux, dysphagia, or unusual wt loss.  No sick contacts and no prior hx of same. No recent travel or exotic pets.  Denies urinary symptoms such as dysuria, frequency, urgency, flank pain, hematuria or n/v, fever, chills. Past Medical History:  Diagnosis Date  . Brachial neuritis   . Cataract    Post traumatic- per patient  . Cervical radiculopathy   . Degeneration of cervical intervertebral disc   . Headache   . Muscle spasm   . Myalgia   . Myofascial pain   . Myositis   . Neuralgia   . Neuritis   . Occipital headache   . Pain management   . Postpartum care following cesarean delivery (07/31/12) 08/01/2012  . S/P primary low transverse C-section (07/31/12) 08/01/2012   Past Surgical History:  Procedure Laterality Date  . CERVICAL FUSION  04/2010   C3-C6  . CESAREAN SECTION N/A 07/31/2012   Procedure: CESAREAN SECTION;  Surgeon: Lovenia Kim, MD;  Location: [facility];  Service: Obstetrics;  Laterality: N/A;   EDD: 08/18/12  . ECTOPIC PREGNANCY SURGERY  04/2005    reports that  has never smoked. she has never used smokeless tobacco. She reports that she does not drink alcohol or use drugs. family history is not on file. No Known Allergies Current Outpatient Medications on File Prior to Visit  Medication Sig Dispense Refill  . cyclobenzaprine (FLEXERIL) 10 MG tablet Take 10 mg by mouth 3 (three) times daily.     Marland Kitchen NUVARING 0.12-0.015  MG/24HR vaginal ring Place 1 each vaginally every 28 (twenty-eight) days.   11   No current facility-administered medications on file prior to visit.    Review of Systems  All other exam findings    Objective:   Physical Exam BP 126/88   Pulse 92   Temp 97.7 F (36.5 C) (Oral)   Ht 5' 5.5" (1.664 m)   Wt 210 lb (95.3 kg)   SpO2 98%   BMI 34.41 kg/m  VS noted, non toxic appearing Constitutional: Pt appears in NAD HENT: Head: NCAT.  Right Ear: External ear normal.  Left Ear: External ear normal.  Eyes: . Pupils are equal, round, and reactive to light. Conjunctivae and EOM are normal Nose: without d/c or deformity Neck: Neck supple. Gross normal ROM Cardiovascular: Normal rate and regular rhythm.   Pulmonary/Chest: Effort normal and breath sounds without rales or wheezing.  Abd:  Soft, ND, + BS, no organomegaly but mild epigastric and moderate LUQ tender noted, DRE deferred per pt, no guarding or rebound Neurological: Pt is alert. At baseline orientation, motor grossly intact Skin: Skin is warm. No rashes, other new lesions, no LE edema Psychiatric: Pt behavior is normal without agitation  No other exam findings  Lab Results  Component Value Date   WBC 6.6 09/30/2015   HGB 12.6 09/30/2015   HCT 38.4 09/30/2015   PLT 275.0 09/30/2015  GLUCOSE 93 09/30/2015   CHOL 179 10/29/2014   TRIG 105.0 10/29/2014   HDL 47.10 10/29/2014   LDLCALC 111 (H) 10/29/2014   ALT 15 09/30/2015   AST 12 09/30/2015   NA 138 09/30/2015   K 3.9 09/30/2015   CL 105 09/30/2015   CREATININE 0.97 09/30/2015   BUN 12 09/30/2015   CO2 27 09/30/2015   TSH 1.86 10/29/2014   INR 0.98 08/14/2010       Assessment & Plan:

## 2017-06-30 NOTE — Assessment & Plan Note (Addendum)
Suspect possible colitis, overall mild, etiology unclear, for cipro course and probiotics; pt declines flagyl due to prior nausea with this, or augmentin with risk of worsening diarrhea, would consider labs and/or CT if any worsening

## 2017-06-30 NOTE — Patient Instructions (Addendum)
Please take all new medication as prescribed - the cipro  Please see your PCP Dr Carlis Abbott on Monday or after for worsening  Please continue all other medications as before, and refills have been done if requested.  Please have the pharmacy call with any other refills you may need.  Please keep your appointments with your specialists as you may have planned

## 2017-07-02 ENCOUNTER — Telehealth: Payer: Self-pay

## 2017-07-02 NOTE — Telephone Encounter (Signed)
Per chart review pt seen at [facility].

## 2017-07-02 NOTE — Telephone Encounter (Signed)
PLEASE NOTE: All timestamps contained within this report are represented as [place] Standard Time. CONFIDENTIALTY NOTICE: This fax transmission is intended only for the addressee. It contains information that is legally privileged, confidential or otherwise protected from use or disclosure. If you are not the intended recipient, you are strictly prohibited from reviewing, disclosing, copying using or disseminating any of this information or taking any action in reliance on or regarding this information. If you have received this fax in error, please notify us immediately by telephone so that we can arrange for its return to [place]. Phone: [phone], Toll-Free: [phone], Fax: [phone] Page: 1 of 2 Call Id: [id number] [place] Patient Name: Victoria Wagner Gender: Female DOB: 01/02/1981 Age: 37 Y 5 M 27 D Return Phone Number: [phone] (Primary), [phone] (Secondary) Address: [address] [place] [address] Client [place] [facility] - Client Client Site [place] Physician Alma Friendly - NP Contact Type Call Who Is Calling Patient / Member / Family / Caregiver Call Type Triage / Clinical Relationship To Patient Self Return Phone Number [phone] (Primary) Chief Complaint ABDOMINAL PAIN - Severe and only in abdomen Reason for Call Symptomatic / Request for Health Information Initial Comment Caller states, abd pains for 1-2 weeks, sharp pains at times. Comes and goes. Today her sharp abd pain has come back. Moderate pain now. Translation No Nurse Assessment Nurse: Orvan Seen, RN, Jacquilin Date/Time (Eastern Time): 06/30/2017 8:31:15 AM Confirm and document reason for call. If symptomatic, describe symptoms. ---Caller states, abd pains for 1-2 weeks, sharp pains at times. Comes and goes. Today her sharp abd pain  has come back. Pain is a 6/10. Constant pain but increases at times. Does the patient have any new or worsening symptoms? ---Yes Will a triage be completed? ---Yes Related visit to physician within the last 2 weeks? ---No Does the PT have any chronic conditions? (i.e. diabetes, asthma, etc.) ---Yes List chronic conditions. ---Chronic back pain Is the patient pregnant or possibly pregnant? (Ask all females between the ages of 4-55) ---No Is this a behavioral health or substance abuse call? ---No Guidelines Guideline Title Affirmed Question Affirmed Notes Nurse Date/Time (Eastern Time) Abdominal Pain - Female [1] MILD-MODERATE pain AND [2] constant AND [3] present > 2 hours Atkins, RN, Jacquilin 06/30/2017 8:32:48 AM Disp. Time Eilene Ghazi Time) Disposition Final User 06/30/2017 8:35:07 AM See Physician within 4 Hours (or PCP triage) Yes Orvan Seen, RN, Jacquilin PLEASE NOTE: All timestamps contained within this report are represented as [place] Standard Time. CONFIDENTIALTY NOTICE: This fax transmission is intended only for the addressee. It contains information that is legally privileged, confidential or otherwise protected from use or disclosure. If you are not the intended recipient, you are strictly prohibited from reviewing, disclosing, copying using or disseminating any of this information or taking any action in reliance on or regarding this information. If you have received this fax in error, please notify us immediately by telephone so that we can arrange for its return to [place]. Phone: [phone], Toll-Free: [phone], Fax: [phone] Page: 2 of 2 Call Id: [id number] [place] Disagree/Comply Comply Caller Understands Yes PreDisposition Did not know what to do Care Advice Given Per Guideline SEE PHYSICIAN WITHIN 4 HOURS (or PCP triage): * IF OFFICE WILL BE OPEN: You need to be seen within the next 3 or 4 hours. Call your doctor's office now or as soon as it opens. REST:  Lie down  and rest until seen. NOTHING BY MOUTH: Do not eat or drink anything for now. CALL BACK IF: * You become worse. CARE ADVICE given per Abdominal Pain, Female (Adult) guideline. Referrals [facility]

## 2017-07-02 NOTE — Telephone Encounter (Signed)
Noted, chart reviewed

## 2017-07-09 ENCOUNTER — Encounter (HOSPITAL_COMMUNITY): Payer: Self-pay | Admitting: Emergency Medicine

## 2017-07-09 ENCOUNTER — Ambulatory Visit (HOSPITAL_COMMUNITY)
Admission: EM | Admit: 2017-07-09 | Discharge: 2017-07-09 | Disposition: A | Discharge status: Home / Self Care | Payer: Managed Care, Other (non HMO) | Attending: Internal Medicine | Admitting: Internal Medicine

## 2017-07-09 DIAGNOSIS — R51 Headache: Secondary | ICD-10-CM | POA: Insufficient documentation

## 2017-07-09 DIAGNOSIS — Z6830 Body mass index (BMI) 30.0-30.9, adult: Secondary | ICD-10-CM | POA: Insufficient documentation

## 2017-07-09 DIAGNOSIS — R1012 Left upper quadrant pain: Secondary | ICD-10-CM | POA: Diagnosis not present

## 2017-07-09 DIAGNOSIS — R1013 Epigastric pain: Secondary | ICD-10-CM | POA: Diagnosis not present

## 2017-07-09 DIAGNOSIS — Z79899 Other long term (current) drug therapy: Secondary | ICD-10-CM | POA: Insufficient documentation

## 2017-07-09 DIAGNOSIS — E669 Obesity, unspecified: Secondary | ICD-10-CM | POA: Insufficient documentation

## 2017-07-09 DIAGNOSIS — R109 Unspecified abdominal pain: Secondary | ICD-10-CM | POA: Diagnosis present

## 2017-07-09 DIAGNOSIS — M5412 Radiculopathy, cervical region: Secondary | ICD-10-CM | POA: Insufficient documentation

## 2017-07-09 DIAGNOSIS — M609 Myositis, unspecified: Secondary | ICD-10-CM | POA: Diagnosis not present

## 2017-07-09 DIAGNOSIS — R102 Pelvic and perineal pain: Secondary | ICD-10-CM | POA: Insufficient documentation

## 2017-07-09 DIAGNOSIS — Z87898 Personal history of other specified conditions: Secondary | ICD-10-CM | POA: Diagnosis not present

## 2017-07-09 LAB — POCT URINALYSIS DIP (DEVICE)
Bilirubin Urine: NEGATIVE
Glucose, UA: NEGATIVE mg/dL
Hgb urine dipstick: NEGATIVE
Nitrite: NEGATIVE
Protein, ur: NEGATIVE mg/dL
Specific Gravity, Urine: 1.025 (ref 1.005–1.030)
Urobilinogen, UA: 0.2 mg/dL (ref 0.0–1.0)
pH: 6 (ref 5.0–8.0)

## 2017-07-09 LAB — COMPREHENSIVE METABOLIC PANEL
ALT: 21 U/L (ref 14–54)
AST: 19 U/L (ref 15–41)
Albumin: 3.6 g/dL (ref 3.5–5.0)
Alkaline Phosphatase: 98 U/L (ref 38–126)
Anion gap: 9 (ref 5–15)
BUN: 13 mg/dL (ref 6–20)
CO2: 23 mmol/L (ref 22–32)
Calcium: 9.1 mg/dL (ref 8.9–10.3)
Chloride: 105 mmol/L (ref 101–111)
Creatinine, Ser: 1.04 mg/dL — ABNORMAL HIGH (ref 0.44–1.00)
GFR calc Af Amer: >60 mL/min (ref >60)
GFR calc non Af Amer: >60 mL/min (ref >60)
Glucose, Bld: 86 mg/dL (ref 65–99)
Potassium: 3.8 mmol/L (ref 3.5–5.1)
Sodium: 137 mmol/L (ref 135–145)
Total Bilirubin: 0.4 mg/dL (ref 0.3–1.2)
Total Protein: 7.4 g/dL (ref 6.5–8.1)

## 2017-07-09 LAB — CBC
HCT: 39.9 % (ref 36.0–46.0)
Hemoglobin: 12.7 g/dL (ref 12.0–15.0)
MCH: 26.6 pg (ref 26.0–34.0)
MCHC: 31.8 g/dL (ref 30.0–36.0)
MCV: 83.5 fL (ref 78.0–100.0)
Platelets: 297 K/uL (ref 150–400)
RBC: 4.78 MIL/uL (ref 3.87–5.11)
RDW: 14.2 % (ref 11.5–15.5)
WBC: 8.1 K/uL (ref 4.0–10.5)

## 2017-07-09 LAB — LIPASE, BLOOD: Lipase: 35 U/L (ref 11–51)

## 2017-07-09 LAB — POCT PREGNANCY, URINE: Preg Test, Ur: NEGATIVE

## 2017-07-09 MED ORDER — GI COCKTAIL ~~LOC~~
ORAL | Status: AC
  Filled 2017-07-09: qty 30

## 2017-07-09 MED ORDER — GI COCKTAIL ~~LOC~~
30.0000 mL | Freq: Once | ORAL | Status: AC
  Administered 2017-07-09: 30 mL via ORAL

## 2017-07-09 NOTE — ED Triage Notes (Signed)
Pt states she was seen 10 days ago at [facility], was told her "colon is inflamed", told her to take a probiotic and shes taken 10 days worth of antibiotics and it helped some but the pain is now back again. LUq pain.

## 2017-07-09 NOTE — Discharge Instructions (Signed)
I will call you if your lab tests are abnormal if I feel you need to go for more quick imaging at the emergency department. This would be tonight or tomorrow morning. You may also monitor your lab tests results on your MyChart.  Try to limit fatty foods in your diet as this may be exacerbating your symptoms. Please call to be seen again by your primary care provider as you will likely need imaging for further evaluation of your pain. Drink plenty of fluids. If you develop increased pain, fevers, blood in stool, vomiting, dehydration, or otherwise worsening of symptoms please go to the Er.

## 2017-07-09 NOTE — ED Provider Notes (Signed)
[facility]    CSN: [id number] Arrival date & time: 07/09/17  1721     History   Chief Complaint Chief Complaint  Patient presents with  . Abdominal Pain    HPI Victoria Wagner is a 37 y.o. female.   [name] presents with complaints of abdominal pain which has been ongoing for the past month. She was seen by her PCP on 3/9, started on cipro and probiotics for colitis. She states symptoms had mildly improved until about three days ago and they worsened again. She states her LUQ has sharp pain and then her general stomach feels achy, "grinding" and uncomfortable. Without nausea or vomiting. She has loose stools. Once a day only at this time, as a few weeks ago she was having multiple. Denies any previous similar prior to this month. Denies fevers, headache, urinary symptoms, vaginal symptoms. She has been eating and drinking. She states her pain increased significantly today after eating a taco salad. No known ill contacts, no recent travel. Rates pain currently 5/10 which is improved. Today was her last dose of cipro. She has had a csection and previous ectopic pregnancy but otherwise without previous abdominal history. Takes flexeril and oxtellar for chronic neck pain.   ROS per HPI.       Past Medical History:  Diagnosis Date  . Brachial neuritis   . Cataract    Post traumatic- per patient  . Cervical radiculopathy   . Degeneration of cervical intervertebral disc   . Headache   . Muscle spasm   . Myalgia   . Myofascial pain   . Myositis   . Neuralgia   . Neuritis   . Occipital headache   . Pain management   . Postpartum care following cesarean delivery (07/31/12) 08/01/2012  . S/P primary low transverse C-section (07/31/12) 08/01/2012    Patient Active Problem List   Diagnosis Date Noted  . Abdominal pain 06/30/2017  . Pelvic pain 12/12/2016  . Preventative health care 11/05/2014  . Abdominal pain, epigastric 08/22/2014  . Obesity (BMI 30.0-34.9) 08/21/2014   . Headache due to intracranial disease 08/06/2014  . Eye pain 08/06/2014  . Fatigue 08/06/2014    Past Surgical History:  Procedure Laterality Date  . CERVICAL FUSION  04/2010   C3-C6  . CESAREAN SECTION N/A 07/31/2012   Procedure: CESAREAN SECTION;  Surgeon: Lovenia Kim, MD;  Location: [facility];  Service: Obstetrics;  Laterality: N/A;   EDD: 08/18/12  . ECTOPIC PREGNANCY SURGERY  04/2005    OB History    Gravida Para Term Preterm AB Living   2 1 1   1 1    SAB TAB Ectopic Multiple Live Births       1   1       Home Medications    Prior to Admission medications   Medication Sig Start Date End Date Taking? Authorizing Provider  ciprofloxacin (CIPRO) 500 MG tablet Take 1 tablet (500 mg total) by mouth 2 (two) times daily for 10 days. 06/30/17 07/10/17  Biagio Borg, MD  cyclobenzaprine (FLEXERIL) 10 MG tablet Take 10 mg by mouth 3 (three) times daily.  04/29/10   [provider]  NUVARING 0.12-0.015 MG/24HR vaginal ring Place 1 each vaginally every 28 (twenty-eight) days.  07/20/14   [provider]  OXcarbazepine ER (OXTELLAR XR) 600 MG TB24 1 tab by mouth three times daily per pt report 06/30/17   Biagio Borg, MD    Family History History reviewed. No  pertinent family history.  Social History Social History   Tobacco Use  . Smoking status: Never Smoker  . Smokeless tobacco: Never Used  Substance Use Topics  . Alcohol use: No    Alcohol/week: 0.0 oz  . Drug use: No     Allergies   Patient has no known allergies.   Review of Systems Review of Systems   Physical Exam Triage Vital Signs ED Triage Vitals  Enc Vitals Group     BP 07/09/17 1815 135/84     Pulse Rate 07/09/17 1815 91     Resp 07/09/17 1815 18     Temp 07/09/17 1815 97.8 F (36.6 C)     Temp src --      SpO2 07/09/17 1815 100 %     Weight --      Height --      Head Circumference --      Peak Flow --      Pain Score 07/09/17 1816 5     Pain Loc --      Pain Edu? --       Excl. in [place]? --    No data found.  Updated Vital Signs BP 135/84   Pulse 91   Temp 97.8 F (36.6 C)   Resp 18   SpO2 100%   Visual Acuity Right Eye Distance:   Left Eye Distance:   Bilateral Distance:    Right Eye Near:   Left Eye Near:    Bilateral Near:     Physical Exam  Constitutional: She is oriented to person, place, and time. She appears well-developed and well-nourished. No distress.  Cardiovascular: Normal rate, regular rhythm and normal heart sounds.  Pulmonary/Chest: Effort normal and breath sounds normal.  Abdominal: Soft. Bowel sounds are normal. There is no tenderness. There is no rigidity, no rebound, no guarding, no CVA tenderness, no tenderness at McBurney's point and negative Murphy's sign.  Points to left lateral upper quadrant where she is experiencing intermittent "sharp" pain; non tender on palpation  Neurological: She is alert and oriented to person, place, and time.  Skin: Skin is warm and dry.     UC Treatments / Results  Labs (all labs ordered are listed, but only abnormal results are displayed) Labs Reviewed  POCT URINALYSIS DIP (DEVICE) - Abnormal; Notable for the following components:      Result Value   Ketones, ur TRACE (*)    Leukocytes, UA SMALL (*)    All other components within normal limits  URINE CULTURE  COMPREHENSIVE METABOLIC PANEL  CBC  LIPASE, BLOOD  POCT PREGNANCY, URINE    EKG  EKG Interpretation None       Radiology No results found.  Procedures Procedures (including critical care time)  Medications Ordered in UC Medications  gi cocktail (Maalox,Lidocaine,Donnatal) (not administered)     Initial Impression / Assessment and Plan / UC Course  I have reviewed the triage vital signs and the nursing notes.  Pertinent labs & imaging results that were available during my care of the patient were reviewed by me and considered in my medical decision making (see chart for details).     Non specific findings  on abdominal exam. Without tenderness. Non toxic in appearance. Eating and drinking, 1 stool a day and without vomiting. Afebrile. Without tachycardia. Labs drawn today, urine sent for culture. Will call patient if there is abnormal lab findings and it appears that more urgent imaging is necessary. Otherwise recommend close follow up with  PCP for further evaluation and possible outpatient imaging. Patient cell: [phone]  Final Clinical Impressions(s) / UC Diagnoses   Final diagnoses:  Left upper quadrant pain    ED Discharge Orders    None       Controlled Substance Prescriptions [place] Controlled Substance Registry consulted? Not Applicable   Zigmund Gottron, NP 07/09/17 1945

## 2017-07-10 ENCOUNTER — Encounter: Payer: Self-pay | Admitting: Primary Care

## 2017-07-10 ENCOUNTER — Ambulatory Visit: Payer: Managed Care, Other (non HMO) | Admitting: Primary Care

## 2017-07-10 VITALS — BP 138/86 | HR 103 | Temp 98.0°F | Ht 65.5 in | Wt 209.5 lb

## 2017-07-10 DIAGNOSIS — R1012 Left upper quadrant pain: Secondary | ICD-10-CM

## 2017-07-10 MED ORDER — OMEPRAZOLE 40 MG PO CPDR: 40.0000 mg | DELAYED_RELEASE_CAPSULE | Freq: Every day | ORAL | 0 refills | Status: DC

## 2017-07-10 NOTE — Patient Instructions (Signed)
Stop by the lab prior to leaving today.   Start omeprazole 40 mg capsules once daily. Do not start this until you provide [place] with the stool specimen.  I'll be in touch once I receive your lab results.  It was a pleasure to see you today!

## 2017-07-10 NOTE — Progress Notes (Signed)
Subjective:    Patient ID: Victoria Wagner, female    DOB: May 02, 1980, 37 y.o.   MRN: [id number]  HPI  Victoria Wagner is a 37 year old female who presents today with a chief complaint of abdominal pain.   She presented to [facility] on 07/09/17 with a chief complaint of continued abdominal pain. She was started on Cipro and Flagyl by MD at [facility] on 06/30/17 for suspicion of colitis. Her abdominal pain has been present for the past one month which is located to the LUQ. She describes her pain as "sharp and achy". She has noticed loose stools which have improved. Her pain increased that day after eating a taco salad. She endorsed compliance to her antibiotics.   She underwent lab work including lipase, CBC, CMP, urinalysis including pregnancy and culture. All labs unremarkable, small leuks noted to UA, culture pending. She was encouraged to follow up with PCP for further evaluation.  Her pain is located to the LUQ mostly. She also feels gassy feeling with radiation of pain to the left upper lateral abdomen for which she describes as "sharp". Eating will sometimes affect her pain, yesterday more so than on other days. She is eating and drinking little over the last several days due to symptoms. She denies nausea, vomiting, blood stools, diarrhea, constipation. She is having loose stools once daily now, has been more frequent last week.   Review of Systems  Constitutional: Negative for fever.  Gastrointestinal: Positive for abdominal pain. Negative for abdominal distention, blood in stool, constipation, diarrhea, nausea and vomiting.       Past Medical History:  Diagnosis Date  . Brachial neuritis   . Cataract    Post traumatic- per patient  . Cervical radiculopathy   . Degeneration of cervical intervertebral disc   . Headache   . Muscle spasm   . Myalgia   . Myofascial pain   . Myositis   . Neuralgia   . Neuritis   . Occipital headache   . Pain management   . Postpartum care  following cesarean delivery (07/31/12) 08/01/2012  . S/P primary low transverse C-section (07/31/12) 08/01/2012     Social History   Socioeconomic History  . Marital status: Married    Spouse name: Antyuan   . Number of children: 1  . Years of education: BA  . Highest education level: Not on file  Social Needs  . Financial resource strain: Not on file  . Food insecurity - worry: Not on file  . Food insecurity - inability: Not on file  . Transportation needs - medical: Not on file  . Transportation needs - non-medical: Not on file  Occupational History  . Occupation: Unemployed  Tobacco Use  . Smoking status: Never Smoker  . Smokeless tobacco: Never Used  Substance and Sexual Activity  . Alcohol use: No    Alcohol/week: 0.0 oz  . Drug use: No  . Sexual activity: Not on file  Other Topics Concern  . Not on file  Social History Narrative   Married   Right handed.   One child, daughter.   Caffeine use: Drinks green tea (Drinks 2-3 times per day)   Enjoys relaxing, spending time with her husband.    Past Surgical History:  Procedure Laterality Date  . CERVICAL FUSION  04/2010   C3-C6  . CESAREAN SECTION N/A 07/31/2012   Procedure: CESAREAN SECTION;  Surgeon: Lovenia Kim, MD;  Location: [facility];  Service: Obstetrics;  Laterality:  N/A;   EDD: 08/18/12  . ECTOPIC PREGNANCY SURGERY  04/2005    No family history on file.  No Known Allergies  Current Outpatient Medications on File Prior to Visit  Medication Sig Dispense Refill  . cyclobenzaprine (FLEXERIL) 10 MG tablet Take 10 mg by mouth 3 (three) times daily.     Marland Kitchen NUVARING 0.12-0.015 MG/24HR vaginal ring Place 1 each vaginally every 28 (twenty-eight) days.   11  . OXcarbazepine ER (OXTELLAR XR) 600 MG TB24 1 tab by mouth three times daily per pt report 60 tablet 0   No current facility-administered medications on file prior to visit.     BP 138/86   Pulse (!) 103   Temp 98 F (36.7 C) (Oral)   Ht 5' 5.5" (1.664 m)    Wt 209 lb 8 oz (95 kg)   SpO2 99%   BMI 34.33 kg/m    Objective:   Physical Exam  Constitutional: She appears well-nourished. She does not appear ill.  Neck: Neck supple.  Cardiovascular: Normal rate and regular rhythm.  Pulmonary/Chest: Effort normal and breath sounds normal.  Abdominal: Soft. Bowel sounds are normal. There is no tenderness.  Skin: Skin is warm and dry.  Psychiatric: She has a normal mood and affect.          Assessment & Plan:  Abdominal Pain:  Located to LUQ x 1 month.  Treated with Cipro, could not tolerate Flagyl. Symptoms persisted. Exam today benign. Labs from UC reviewed and are unremarkable. Check H Pylori studies today. Consider CT if labs unremarkable.  Start omeprazole 40 mg once daily once H pylori stool specimen has been collected. She is stable, appears well. Discussed to push intake of water.  Pleas Koch, NP

## 2017-07-11 LAB — URINE CULTURE: Culture: NO GROWTH

## 2017-07-11 NOTE — Addendum Note (Signed)
Addended by: Lendon Collar on: 07/11/2017 01:12 PM   Modules accepted: Orders

## 2017-07-12 LAB — HELICOBACTER PYLORI  SPECIAL ANTIGEN
MICRO NUMBER:: 90351438
SPECIMEN QUALITY: ADEQUATE

## 2017-10-25 LAB — HM PAP SMEAR

## 2017-12-03 ENCOUNTER — Ambulatory Visit: Payer: Commercial Managed Care - PPO | Admitting: Primary Care

## 2017-12-03 ENCOUNTER — Encounter: Payer: Self-pay | Admitting: Primary Care

## 2017-12-03 DIAGNOSIS — R030 Elevated blood-pressure reading, without diagnosis of hypertension: Secondary | ICD-10-CM | POA: Diagnosis not present

## 2017-12-03 DIAGNOSIS — I10 Essential (primary) hypertension: Secondary | ICD-10-CM | POA: Insufficient documentation

## 2017-12-03 NOTE — Patient Instructions (Addendum)
Your blood pressure is normal today.  Consider purchasing a blood pressure cuff to check your blood pressure at home. It should run less than 135/90.  We will notify your GYN that your blood pressure is normal.  It was a pleasure to see you today!

## 2017-12-03 NOTE — Progress Notes (Signed)
Subjective:    Patient ID: Victoria Wagner, female    DOB: 1980/07/23, 37 y.o.   MRN: [id number]  HPI  Victoria Wagner is a 37 year old female who presents today with a chief complaint of elevated blood pressure readings.  BP Readings from Last 3 Encounters:  12/03/17 126/84  07/10/17 138/86  07/09/17 135/84   She was at her GYN's office in June 2019 and was noted to have elevated blood pressure readings during that visit: 140/98, 150/100. Her BP readings were suspected to be secondary to the NuvaRing so she was instructed to follow up with PCP in 2 months for repeat readings.  She's not checked her BP since her visit with GYN. She denies increased headaches. She denies dizziness, visual changes.   Review of Systems  Eyes: Negative for visual disturbance.  Respiratory: Negative for shortness of breath.   Cardiovascular: Negative for chest pain.  Neurological: Negative for dizziness.       Denies increased headaches       Past Medical History:  Diagnosis Date  . Brachial neuritis   . Cataract    Post traumatic- per patient  . Cervical radiculopathy   . Degeneration of cervical intervertebral disc   . Headache   . Muscle spasm   . Myalgia   . Myofascial pain   . Myositis   . Neuralgia   . Neuritis   . Occipital headache   . Pain management   . Postpartum care following cesarean delivery (07/31/12) 08/01/2012  . S/P primary low transverse C-section (07/31/12) 08/01/2012     Social History   Socioeconomic History  . Marital status: Married    Spouse name: Antyuan   . Number of children: 1  . Years of education: BA  . Highest education level: Not on file  Occupational History  . Occupation: Unemployed  Social Needs  . Financial resource strain: Not on file  . Food insecurity:    Worry: Not on file    Inability: Not on file  . Transportation needs:    Medical: Not on file    Non-medical: Not on file  Tobacco Use  . Smoking status: Never Smoker  . Smokeless tobacco:  Never Used  Substance and Sexual Activity  . Alcohol use: No    Alcohol/week: 0.0 standard drinks  . Drug use: No  . Sexual activity: Not on file  Lifestyle  . Physical activity:    Days per week: Not on file    Minutes per session: Not on file  . Stress: Not on file  Relationships  . Social connections:    Talks on phone: Not on file    Gets together: Not on file    Attends religious service: Not on file    Active member of club or organization: Not on file    Attends meetings of clubs or organizations: Not on file    Relationship status: Not on file  . Intimate partner violence:    Fear of current or ex partner: Not on file    Emotionally abused: Not on file    Physically abused: Not on file    Forced sexual activity: Not on file  Other Topics Concern  . Not on file  Social History Narrative   Married   Right handed.   One child, daughter.   Caffeine use: Drinks green tea (Drinks 2-3 times per day)   Enjoys relaxing, spending time with her husband.    Past Surgical History:  Procedure  Laterality Date  . CERVICAL FUSION  04/2010   C3-C6  . CESAREAN SECTION N/A 07/31/2012   Procedure: CESAREAN SECTION;  Surgeon: Lovenia Kim, MD;  Location: [facility];  Service: Obstetrics;  Laterality: N/A;   EDD: 08/18/12  . ECTOPIC PREGNANCY SURGERY  04/2005    No family history on file.  No Known Allergies  Current Outpatient Medications on File Prior to Visit  Medication Sig Dispense Refill  . cyclobenzaprine (FLEXERIL) 10 MG tablet Take 10 mg by mouth 3 (three) times daily.     Marland Kitchen NUVARING 0.12-0.015 MG/24HR vaginal ring Place 1 each vaginally every 28 (twenty-eight) days.   11  . omeprazole (PRILOSEC) 40 MG capsule Take 1 capsule (40 mg total) by mouth daily. 30 capsule 0  . OXcarbazepine ER (OXTELLAR XR) 600 MG TB24 1 tab by mouth three times daily per pt report 60 tablet 0   No current facility-administered medications on file prior to visit.     BP 126/84   Pulse 88    Temp 98 F (36.7 C) (Oral)   Ht 5' 5.5" (1.664 m)   Wt 211 lb 8 oz (95.9 kg)   SpO2 99%   BMI 34.66 kg/m    Objective:   Physical Exam  Constitutional: She appears well-nourished.  Neck: Neck supple.  Cardiovascular: Normal rate and regular rhythm.  Respiratory: Effort normal and breath sounds normal.  Skin: Skin is warm and dry.           Assessment & Plan:

## 2017-12-03 NOTE — Assessment & Plan Note (Signed)
Normal in the office today. Did review GYN visit from 10/15/17 with two elevated readings. Discussed that she could monitor readings intermittently and report readings at or above 135/90. Will send notification to GYN.

## 2018-01-15 DIAGNOSIS — L2089 Other atopic dermatitis: Secondary | ICD-10-CM | POA: Diagnosis not present

## 2018-01-22 ENCOUNTER — Encounter (INDEPENDENT_AMBULATORY_CARE_PROVIDER_SITE_OTHER): Payer: Self-pay

## 2018-02-06 ENCOUNTER — Ambulatory Visit (INDEPENDENT_AMBULATORY_CARE_PROVIDER_SITE_OTHER): Payer: Commercial Managed Care - PPO | Admitting: Bariatrics

## 2018-02-06 ENCOUNTER — Encounter (INDEPENDENT_AMBULATORY_CARE_PROVIDER_SITE_OTHER): Payer: Self-pay | Admitting: Bariatrics

## 2018-02-06 VITALS — BP 126/84 | HR 86 | Temp 97.9°F | Ht 65.0 in | Wt 207.0 lb

## 2018-02-06 DIAGNOSIS — R0602 Shortness of breath: Secondary | ICD-10-CM | POA: Diagnosis not present

## 2018-02-06 DIAGNOSIS — Z9189 Other specified personal risk factors, not elsewhere classified: Secondary | ICD-10-CM

## 2018-02-06 DIAGNOSIS — R5383 Other fatigue: Secondary | ICD-10-CM

## 2018-02-06 DIAGNOSIS — F3289 Other specified depressive episodes: Secondary | ICD-10-CM | POA: Diagnosis not present

## 2018-02-06 DIAGNOSIS — Z6834 Body mass index (BMI) 34.0-34.9, adult: Secondary | ICD-10-CM

## 2018-02-06 DIAGNOSIS — M542 Cervicalgia: Secondary | ICD-10-CM

## 2018-02-06 DIAGNOSIS — E669 Obesity, unspecified: Secondary | ICD-10-CM

## 2018-02-06 DIAGNOSIS — Z0289 Encounter for other administrative examinations: Secondary | ICD-10-CM

## 2018-02-07 LAB — VITAMIN D 25 HYDROXY (VIT D DEFICIENCY, FRACTURES): Vit D, 25-Hydroxy: 36.8 ng/mL (ref 30.0–100.0)

## 2018-02-07 LAB — COMPREHENSIVE METABOLIC PANEL
ALT: 14 IU/L (ref 0–32)
AST: 8 IU/L (ref 0–40)
Albumin/Globulin Ratio: 1.3 (ref 1.2–2.2)
Albumin: 4.1 g/dL (ref 3.5–5.5)
Alkaline Phosphatase: 132 IU/L — ABNORMAL HIGH (ref 39–117)
BUN/Creatinine Ratio: 14 (ref 9–23)
BUN: 14 mg/dL (ref 6–20)
Bilirubin Total: 0.3 mg/dL (ref 0.0–1.2)
CO2: 23 mmol/L (ref 20–29)
Calcium: 9.4 mg/dL (ref 8.7–10.2)
Chloride: 104 mmol/L (ref 96–106)
Creatinine, Ser: 1.01 mg/dL — ABNORMAL HIGH (ref 0.57–1.00)
GFR calc Af Amer: 82 mL/min/{1.73_m2} (ref >59)
GFR calc non Af Amer: 71 mL/min/{1.73_m2} (ref >59)
Globulin, Total: 3.2 g/dL (ref 1.5–4.5)
Glucose: 83 mg/dL (ref 65–99)
Potassium: 4 mmol/L (ref 3.5–5.2)
Sodium: 143 mmol/L (ref 134–144)
Total Protein: 7.3 g/dL (ref 6.0–8.5)

## 2018-02-07 LAB — CBC WITH DIFFERENTIAL
Basophils Absolute: 0 10*3/uL (ref 0.0–0.2)
Basos: 1 %
EOS (ABSOLUTE): 0.1 10*3/uL (ref 0.0–0.4)
Eos: 1 %
Hematocrit: 39.6 % (ref 34.0–46.6)
Hemoglobin: 12.9 g/dL (ref 11.1–15.9)
Immature Grans (Abs): 0 10*3/uL (ref 0.0–0.1)
Immature Granulocytes: 1 %
Lymphocytes Absolute: 2.3 10*3/uL (ref 0.7–3.1)
Lymphs: 38 %
MCH: 26.1 pg — ABNORMAL LOW (ref 26.6–33.0)
MCHC: 32.6 g/dL (ref 31.5–35.7)
MCV: 80 fL (ref 79–97)
Monocytes Absolute: 0.3 10*3/uL (ref 0.1–0.9)
Monocytes: 5 %
Neutrophils Absolute: 3.3 10*3/uL (ref 1.4–7.0)
Neutrophils: 54 %
RBC: 4.94 x10E6/uL (ref 3.77–5.28)
RDW: 13.3 % (ref 12.3–15.4)
WBC: 6.1 10*3/uL (ref 3.4–10.8)

## 2018-02-07 LAB — FOLATE: Folate: 16.2 ng/mL (ref >3.0)

## 2018-02-07 LAB — HEMOGLOBIN A1C
Est. average glucose Bld gHb Est-mCnc: 117 mg/dL
Hgb A1c MFr Bld: 5.7 % — ABNORMAL HIGH (ref 4.8–5.6)

## 2018-02-07 LAB — T3: T3, Total: 170 ng/dL (ref 71–180)

## 2018-02-07 LAB — INSULIN, RANDOM: INSULIN: 15.6 u[IU]/mL (ref 2.6–24.9)

## 2018-02-07 LAB — T4, FREE: Free T4: 1.23 ng/dL (ref 0.82–1.77)

## 2018-02-07 LAB — LIPID PANEL WITH LDL/HDL RATIO
Cholesterol, Total: 210 mg/dL — ABNORMAL HIGH (ref 100–199)
HDL: 61 mg/dL (ref >39)
LDL Calculated: 133 mg/dL — ABNORMAL HIGH (ref 0–99)
LDl/HDL Ratio: 2.2 ratio (ref 0.0–3.2)
Triglycerides: 82 mg/dL (ref 0–149)
VLDL Cholesterol Cal: 16 mg/dL (ref 5–40)

## 2018-02-07 LAB — VITAMIN B12: Vitamin B-12: 422 pg/mL (ref 232–1245)

## 2018-02-07 LAB — TSH: TSH: 1.12 u[IU]/mL (ref 0.450–4.500)

## 2018-02-11 DIAGNOSIS — Z6834 Body mass index (BMI) 34.0-34.9, adult: Secondary | ICD-10-CM

## 2018-02-11 DIAGNOSIS — G8929 Other chronic pain: Secondary | ICD-10-CM | POA: Insufficient documentation

## 2018-02-11 DIAGNOSIS — E669 Obesity, unspecified: Secondary | ICD-10-CM | POA: Insufficient documentation

## 2018-02-11 DIAGNOSIS — Z6837 Body mass index (BMI) 37.0-37.9, adult: Secondary | ICD-10-CM | POA: Insufficient documentation

## 2018-02-11 DIAGNOSIS — E6609 Other obesity due to excess calories: Secondary | ICD-10-CM | POA: Insufficient documentation

## 2018-02-11 NOTE — Progress Notes (Signed)
.  Office: [phone]  /  Fax: [phone]   HPI:   Chief Complaint: [name] (MR# [id number]) is a 37 y.o. female who presents on 02/11/2018 for obesity evaluation and treatment. Current BMI is Body mass index is 34.45 kg/m.Marland Kitchen Victoria Wagner has struggled with obesity for years and has been unsuccessful in either losing weight or maintaining long term weight loss. Victoria Wagner attended our information session and states she is currently in the action stage of change and ready to dedicate time achieving and maintaining a healthier weight.  Victoria Wagner states she thinks her family will eat healthier with  her she struggles with family and or coworkers weight loss sabotage her desired weight loss is 47 to 67 lbs she started gaining weight after surgery her heaviest weight ever was 230 lbs. she is a picky eater and doesn't like to eat healthier foods  she has significant food cravings for "sugar foods and fast food", more at night  she snacks frequently in the evenings She occasionally skips meals she struggles with emotional eating    Fatigue Victoria Wagner feels her energy is lower than it should be. This has worsened with weight gain and has not worsened recently. Victoria Wagner admits to daytime somnolence and admits to waking up still tired. Patient is at risk for obstructive sleep apnea. Patent has a history of symptoms of daytime fatigue, morning fatigue and morning headache. Patient generally gets 6 hours of sleep per night, and states they are mostly sleeping okay. Snoring is present. Apneic episodes are not present. Epworth Sleepiness Score is 2  Dyspnea on exertion Victoria Wagner notes increasing shortness of breath with certain activities and seems to be worsening over time with weight gain. She notes getting out of breath sooner with activity than she used to. This has not gotten worse recently. Victoria Wagner denies orthopnea.  Pain (cervical spine) Victoria Wagner has a history of cervical spine pain and she  currently takes Oxtellar and is well managed. Victoria Wagner has seen pain management for eleven years.  At risk for cardiovascular disease Victoria Wagner is at a higher than average risk for cardiovascular disease due to obesity. She currently denies any chest pain.  Depression with emotional eating behaviors Victoria Wagner is struggles with emotional eating and using food for comfort to the extent that it is negatively impacting her health. She often snacks when she is not hungry. Victoria Wagner sometimes feels she is out of control and then feels guilty that she made poor food choices. She is attempting to work on behavior modification techniques to help reduce her emotional eating. Victoria Wagner has seen a therapist in the past. She shows no sign of suicidal or homicidal ideations.  Depression Screen Victoria Wagner's Food and Mood (modified PHQ-9) score was  Depression screen PHQ 2/9 02/06/2018  Decreased Interest 2  Down, Depressed, Hopeless 2  PHQ - 2 Score 4  Altered sleeping 0  Tired, decreased energy 2  Change in appetite 2  Feeling bad or failure about yourself  0  Trouble concentrating 2  Moving slowly or fidgety/restless 0  Suicidal thoughts 0  PHQ-9 Score 10  Difficult doing work/chores Somewhat difficult    ALLERGIES: No Known Allergies  MEDICATIONS: Current Outpatient Medications on File Prior to Visit  Medication Sig Dispense Refill  . cyclobenzaprine (FLEXERIL) 10 MG tablet Take 10 mg by mouth 3 (three) times daily.     Marland Kitchen NUVARING 0.12-0.015 MG/24HR vaginal ring Place 1 each vaginally every 28 (twenty-eight) days.   11  . omeprazole (PRILOSEC) 40 MG  capsule Take 1 capsule (40 mg total) by mouth daily. 30 capsule 0  . OXcarbazepine ER (OXTELLAR XR) 600 MG TB24 1 tab by mouth three times daily per pt report 60 tablet 0   No current facility-administered medications on file prior to visit.     PAST MEDICAL HISTORY: Past Medical History:  Diagnosis Date  . Anxiety   . Brachial neuritis   . Cataract      Post traumatic- per patient  . Cervical radiculopathy   . Cervical vertigo   . Degeneration of cervical intervertebral disc   . Depression   . Dizziness   . Headache   . Joint pain   . Muscle spasm   . Myalgia   . Myofascial pain   . Myositis   . Neuralgia   . Neuritis   . Occipital headache   . Pain management   . Postpartum care following cesarean delivery (07/31/12) 08/01/2012  . S/P primary low transverse C-section (07/31/12) 08/01/2012  . Swallowing difficulty   . Weakness of both hands   . Weakness of both legs     PAST SURGICAL HISTORY: Past Surgical History:  Procedure Laterality Date  . CERVICAL FUSION  04/2010   C3-C6  . CESAREAN SECTION N/A 07/31/2012   Procedure: CESAREAN SECTION;  Surgeon: Lovenia Kim, MD;  Location: [facility];  Service: Obstetrics;  Laterality: N/A;   EDD: 08/18/12  . ECTOPIC PREGNANCY SURGERY  04/2005    SOCIAL HISTORY: Social History   Tobacco Use  . Smoking status: Never Smoker  . Smokeless tobacco: Never Used  Substance Use Topics  . Alcohol use: No    Alcohol/week: 0.0 standard drinks  . Drug use: No    FAMILY HISTORY: History reviewed. No pertinent family history.  ROS: Review of Systems  Constitutional: Positive for malaise/fatigue.  HENT:       + Difficult or Painful Swallowing  Cardiovascular: Negative for chest pain.  Musculoskeletal: Positive for neck pain.       + Muscle or Joint Pain + Muscle Stiffness  Neurological: Positive for dizziness, weakness and headaches.  Psychiatric/Behavioral: Positive for depression. Negative for suicidal ideas. The patient has insomnia.     PHYSICAL EXAM: Blood pressure 126/84, pulse 86, temperature 97.9 F (36.6 C), temperature source Oral, height 5\' 5"  (1.651 m), weight 207 lb (93.9 kg), SpO2 100 %. Body mass index is 34.45 kg/m. Physical Exam  Constitutional: She is oriented to person, place, and time. She appears well-developed and well-nourished.  HENT:  Head: Normocephalic  and atraumatic.  Nose: Nose normal.  Mallanpati = 4  Eyes: EOM are normal. No scleral icterus.  Neck: Normal range of motion. Neck supple. No thyromegaly present.  Cardiovascular: Normal rate and regular rhythm.  Pulmonary/Chest: No respiratory distress.  Abdominal: Soft. There is no tenderness.  + Obesity  Musculoskeletal: Normal range of motion.  Range of Motion normal in all 4 extremities  Neurological: She is alert and oriented to person, place, and time. Coordination normal.  Skin: Skin is warm and dry.  Psychiatric: She has a normal mood and affect. She expresses no homicidal and no suicidal ideation.  Vitals reviewed.   RECENT LABS AND TESTS: BMET    Component Value Date/Time   NA 143 02/06/2018 1027   K 4.0 02/06/2018 1027   CL 104 02/06/2018 1027   CO2 23 02/06/2018 1027   GLUCOSE 83 02/06/2018 1027   GLUCOSE 86 07/09/2017 1936   BUN 14 02/06/2018 1027   CREATININE 1.01 (  H) 02/06/2018 1027   CREATININE 0.86 08/21/2014 1531   CALCIUM 9.4 02/06/2018 1027   GFRNONAA 71 02/06/2018 1027   GFRAA 82 02/06/2018 1027   Lab Results  Component Value Date   HGBA1C 5.7 (H) 02/06/2018   Lab Results  Component Value Date   INSULIN 15.6 02/06/2018   CBC    Component Value Date/Time   WBC 6.1 02/06/2018 1027   WBC 8.1 07/09/2017 1936   RBC 4.94 02/06/2018 1027   RBC 4.78 07/09/2017 1936   HGB 12.9 02/06/2018 1027   HCT 39.6 02/06/2018 1027   PLT 297 07/09/2017 1936   MCV 80 02/06/2018 1027   MCH 26.1 (L) 02/06/2018 1027   MCH 26.6 07/09/2017 1936   MCHC 32.6 02/06/2018 1027   MCHC 31.8 07/09/2017 1936   RDW 13.3 02/06/2018 1027   LYMPHSABS 2.3 02/06/2018 1027   MONOABS 0.4 09/30/2015 1118   EOSABS 0.1 02/06/2018 1027   BASOSABS 0.0 02/06/2018 1027   Iron/TIBC/Ferritin/ %Sat No results found for: IRON, TIBC, FERRITIN, IRONPCTSAT Lipid Panel     Component Value Date/Time   CHOL 210 (H) 02/06/2018 1027   TRIG 82 02/06/2018 1027   HDL 61 02/06/2018 1027    CHOLHDL 4 10/29/2014 0905   VLDL 21.0 10/29/2014 0905   LDLCALC 133 (H) 02/06/2018 1027   Hepatic Function Panel     Component Value Date/Time   PROT 7.3 02/06/2018 1027   ALBUMIN 4.1 02/06/2018 1027   AST 8 02/06/2018 1027   ALT 14 02/06/2018 1027   ALKPHOS 132 (H) 02/06/2018 1027   BILITOT 0.3 02/06/2018 1027   BILIDIR 0.1 09/30/2015 1118      Component Value Date/Time   TSH 1.120 02/06/2018 1027   Vitamin D There are no recent lab results  ECG  shows NSR with a rate of 80 BPM INDIRECT CALORIMETER done today shows a VO2 of 238 and a REE of 1659. Her calculated basal metabolic rate is 0102 thus her basal metabolic rate is better than expected.    ASSESSMENT AND PLAN: Other fatigue - Plan: EKG 12-Lead, EKG 12-Lead, Vitamin B12, CBC With Differential, Comprehensive metabolic panel, Folate, Hemoglobin A1c, Insulin, random, Lipid Panel With LDL/HDL Ratio, T3, T4, free, TSH, VITAMIN D 25 Hydroxy (Vit-D Deficiency, Fractures)  Shortness of breath on exertion  Cervical spine pain  Other depression - with emotional eating  Class 1 obesity with serious comorbidity and body mass index (BMI) of 34.0 to 34.9 in adult, unspecified obesity type  PLAN:  Fatigue Victoria Wagner was informed that her fatigue may be related to obesity, depression or many other causes. Labs will be ordered, and in the meanwhile Victoria Wagner has agreed to work on diet and weight loss and will slowly begin exercise over time (cardio and resistance) to help with fatigue. Proper sleep hygiene was discussed including the need for 7-8 hours of quality sleep each night. A sleep study was not ordered based on symptoms and Epworth score.  Dyspnea on exertion Melenda's shortness of breath appears to be obesity related and exercise induced. She has agreed to work on weight loss and slowly begin exercise over time (cardio and resistance) to treat her exercise induced shortness of breath. If Verenise follows our instructions and  loses weight without improvement of her shortness of breath, we will plan to refer to pulmonology. We will monitor this condition regularly. Victoria Wagner agrees to this plan.  Pain (cervical spine) Victoria Wagner will continue to follow up with pain management. Victoria Wagner will follow up with  our clinic in 2 weeks.  Depression with Emotional Eating Behaviors We discussed behavior modification techniques today to help Karizma deal with her emotional eating and depression. We will refer to Dr. Mallie Mussel our bariatric psychologist.  Cardiovascular risk counselling Victoria Wagner was given extended (15 minutes) coronary artery disease prevention counseling today. She is 37 y.o. female and has risk factors for heart disease including obesity. We discussed intensive lifestyle modifications today with an emphasis on specific weight loss instructions and strategies. Pt was also informed of the importance of increasing exercise and decreasing saturated fats to help prevent heart disease.  Depression Screen Victoria Wagner had a moderately positive depression screening. Depression is commonly associated with obesity and often results in emotional eating behaviors. We will monitor this closely and work on CBT to help improve the non-hunger eating patterns. Referral to Psychology may be required if no improvement is seen as she continues in our clinic.  Obesity Victoria Wagner is currently in the action stage of change and her goal is to continue with weight loss efforts She has agreed to follow the Category 2 plan Victoria Wagner has been instructed to work up to a goal of 150 minutes of combined cardio and strengthening exercise per week for weight loss and overall health benefits. We discussed the following Behavioral Modification Strategies today:increase H2O intake, no skipping meals, better snacking choices, increasing lean protein intake, decreasing simple carbohydrates , increasing vegetables, decrease eating out, work on meal planning and easy  cooking plans and decrease liquid calories  Victoria Wagner has agreed to follow up with our clinic in 2 weeks. She was informed of the importance of frequent follow up visits to maximize her success with intensive lifestyle modifications for her multiple health conditions. She was informed we would discuss her lab results at her next visit unless there is a critical issue that needs to be addressed sooner. Eather agreed to keep her next visit at the agreed upon time to discuss these results.    OBESITY BEHAVIORAL INTERVENTION VISIT  Today's visit was # 1   Starting weight: 207 lbs Starting date: 02/06/18 Today's weight : 207 lbs  Today's date: 02/06/2018 Total lbs lost to date: 0   ASK: We discussed the diagnosis of obesity with Victoria Wagner G Kauer today and Lanie agreed to give [place] permission to discuss obesity behavioral modification therapy today.  ASSESS: Vivien has the diagnosis of obesity and her BMI today is 34.45 Danita is in the action stage of change   ADVISE: Makinze was educated on the multiple health risks of obesity as well as the benefit of weight loss to improve her health. She was advised of the need for long term treatment and the importance of lifestyle modifications to improve her current health and to decrease her risk of future health problems.  AGREE: Multiple dietary modification options and treatment options were discussed and  Victoria Wagner agreed to follow the recommendations documented in the above note.  ARRANGE: Tanija was educated on the importance of frequent visits to treat obesity as outlined per CMS and USPSTF guidelines and agreed to schedule her next follow up appointment today.   Corey Skains, am acting as Location manager for [employer]. Owens Shark, DO  I have reviewed the above documentation for accuracy and completeness, and I agree with the above. -Jearld Lesch, DO

## 2018-02-18 ENCOUNTER — Other Ambulatory Visit: Payer: Self-pay | Admitting: Primary Care
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
## 2018-02-19 ENCOUNTER — Ambulatory Visit (INDEPENDENT_AMBULATORY_CARE_PROVIDER_SITE_OTHER): Payer: Commercial Managed Care - PPO | Admitting: Psychology

## 2018-02-19 DIAGNOSIS — F330 Major depressive disorder, recurrent, mild: Secondary | ICD-10-CM | POA: Diagnosis not present

## 2018-02-21 ENCOUNTER — Ambulatory Visit (INDEPENDENT_AMBULATORY_CARE_PROVIDER_SITE_OTHER): Payer: Commercial Managed Care - PPO | Admitting: Bariatrics

## 2018-02-21 ENCOUNTER — Encounter (INDEPENDENT_AMBULATORY_CARE_PROVIDER_SITE_OTHER): Payer: Self-pay | Admitting: Bariatrics

## 2018-02-21 VITALS — BP 124/84 | HR 95 | Temp 98.0°F | Ht 65.0 in | Wt 205.0 lb

## 2018-02-21 DIAGNOSIS — E669 Obesity, unspecified: Secondary | ICD-10-CM | POA: Diagnosis not present

## 2018-02-21 DIAGNOSIS — E559 Vitamin D deficiency, unspecified: Secondary | ICD-10-CM

## 2018-02-21 DIAGNOSIS — R7303 Prediabetes: Secondary | ICD-10-CM | POA: Diagnosis not present

## 2018-02-21 DIAGNOSIS — Z9189 Other specified personal risk factors, not elsewhere classified: Secondary | ICD-10-CM

## 2018-02-21 DIAGNOSIS — Z6834 Body mass index (BMI) 34.0-34.9, adult: Secondary | ICD-10-CM

## 2018-02-21 MED ORDER — VITAMIN D (ERGOCALCIFEROL) 1.25 MG (50000 UNIT) PO CAPS: 50000.0000 [IU] | ORAL_CAPSULE | ORAL | 0 refills | Status: DC

## 2018-02-25 DIAGNOSIS — E1165 Type 2 diabetes mellitus with hyperglycemia: Secondary | ICD-10-CM | POA: Insufficient documentation

## 2018-02-25 DIAGNOSIS — R7303 Prediabetes: Secondary | ICD-10-CM | POA: Insufficient documentation

## 2018-02-25 NOTE — Progress Notes (Signed)
Office: [phone]  /  Fax: [phone]   HPI:   Chief Complaint: OBESITY Victoria Wagner is here to discuss her progress with her obesity treatment plan. She is on the Category 2 plan and is following her eating plan approximately 50 % of the time. She states she is exercising 0 minutes 0 times per week. Victoria Wagner was not able to follow the plan completely due to illness. She has followed the plan for about 9 days. She has had some increased hunger. She denies stress eating.  Her weight is 205 lb (93 kg) today and has had a weight loss of 2 pounds over a period of 2 weeks since her last visit. She has lost 2 lbs since starting treatment with [place].  Pre-Diabetes Victoria Wagner has a new diagnosis of pre-diabetes based on her elevated Hgb A1c and was informed this puts her at greater risk of developing diabetes. She is not taking metformin currently and continues to work on diet and exercise to decrease risk of diabetes. Her last A1c was 5.7 and Insulin was 15.6 on 02/06/18. She admits polyphagia.  Vitamin D deficiency Victoria Wagner has a diagnosis of vitamin D deficiency. Her last vitamin D level was 36.8 on 02/06/18. She is not currently taking vit D and denies nausea, vomiting or muscle weakness.  At risk for osteopenia and osteoporosis Victoria Wagner is at higher risk of osteopenia and osteoporosis due to vitamin D deficiency.   ALLERGIES: No Known Allergies  MEDICATIONS: Current Outpatient Medications on File Prior to Visit  Medication Sig Dispense Refill  . cyclobenzaprine (FLEXERIL) 10 MG tablet Take 10 mg by mouth 3 (three) times daily.     Marland Kitchen NUVARING 0.12-0.015 MG/24HR vaginal ring Place 1 each vaginally every 28 (twenty-eight) days.   11  . omeprazole (PRILOSEC) 40 MG capsule Take 1 capsule (40 mg total) by mouth daily. 30 capsule 0  . OXcarbazepine ER (OXTELLAR XR) 600 MG TB24 1 tab by mouth three times daily per pt report 60 tablet 0   No current facility-administered medications on file prior to  visit.     PAST MEDICAL HISTORY: Past Medical History:  Diagnosis Date  . Anxiety   . Brachial neuritis   . Cataract    Post traumatic- per patient  . Cervical radiculopathy   . Cervical vertigo   . Degeneration of cervical intervertebral disc   . Depression   . Dizziness   . Headache   . Joint pain   . Muscle spasm   . Myalgia   . Myofascial pain   . Myositis   . Neuralgia   . Neuritis   . Occipital headache   . Pain management   . Postpartum care following cesarean delivery (07/31/12) 08/01/2012  . S/P primary low transverse C-section (07/31/12) 08/01/2012  . Swallowing difficulty   . Weakness of both hands   . Weakness of both legs     PAST SURGICAL HISTORY: Past Surgical History:  Procedure Laterality Date  . CERVICAL FUSION  04/2010   C3-C6  . CESAREAN SECTION N/A 07/31/2012   Procedure: CESAREAN SECTION;  Surgeon: Lovenia Kim, MD;  Location: [facility];  Service: Obstetrics;  Laterality: N/A;   EDD: 08/18/12  . ECTOPIC PREGNANCY SURGERY  04/2005    SOCIAL HISTORY: Social History   Tobacco Use  . Smoking status: Never Smoker  . Smokeless tobacco: Never Used  Substance Use Topics  . Alcohol use: No    Alcohol/week: 0.0 standard drinks  . Drug use: No  FAMILY HISTORY: No family history on file.  ROS: Review of Systems  Constitutional: Positive for weight loss.  Gastrointestinal: Negative for nausea and vomiting.  Musculoskeletal:       Negative for muscle weakness.  Endo/Heme/Allergies:       Positive for polyphagia.    PHYSICAL EXAM: Blood pressure 124/84, pulse 95, temperature 98 F (36.7 C), temperature source Oral, height 5\' 5"  (1.651 m), weight 205 lb (93 kg), SpO2 98 %. Body mass index is 34.11 kg/m. Physical Exam  Constitutional: She is oriented to person, place, and time. She appears well-developed and well-nourished.  Cardiovascular: Normal rate.  Pulmonary/Chest: Effort normal.  Musculoskeletal: Normal range of motion.  Neurological:  She is oriented to person, place, and time.  Skin: Skin is warm and dry.  Psychiatric: She has a normal mood and affect. Her behavior is normal.  Vitals reviewed.   RECENT LABS AND TESTS: BMET    Component Value Date/Time   NA 143 02/06/2018 1027   K 4.0 02/06/2018 1027   CL 104 02/06/2018 1027   CO2 23 02/06/2018 1027   GLUCOSE 83 02/06/2018 1027   GLUCOSE 86 07/09/2017 1936   BUN 14 02/06/2018 1027   CREATININE 1.01 (H) 02/06/2018 1027   CREATININE 0.86 08/21/2014 1531   CALCIUM 9.4 02/06/2018 1027   GFRNONAA 71 02/06/2018 1027   GFRAA 82 02/06/2018 1027   Lab Results  Component Value Date   HGBA1C 5.7 (H) 02/06/2018   Lab Results  Component Value Date   INSULIN 15.6 02/06/2018   CBC    Component Value Date/Time   WBC 6.1 02/06/2018 1027   WBC 8.1 07/09/2017 1936   RBC 4.94 02/06/2018 1027   RBC 4.78 07/09/2017 1936   HGB 12.9 02/06/2018 1027   HCT 39.6 02/06/2018 1027   PLT 297 07/09/2017 1936   MCV 80 02/06/2018 1027   MCH 26.1 (L) 02/06/2018 1027   MCH 26.6 07/09/2017 1936   MCHC 32.6 02/06/2018 1027   MCHC 31.8 07/09/2017 1936   RDW 13.3 02/06/2018 1027   LYMPHSABS 2.3 02/06/2018 1027   MONOABS 0.4 09/30/2015 1118   EOSABS 0.1 02/06/2018 1027   BASOSABS 0.0 02/06/2018 1027   Iron/TIBC/Ferritin/ %Sat No results found for: IRON, TIBC, FERRITIN, IRONPCTSAT Lipid Panel     Component Value Date/Time   CHOL 210 (H) 02/06/2018 1027   TRIG 82 02/06/2018 1027   HDL 61 02/06/2018 1027   CHOLHDL 4 10/29/2014 0905   VLDL 21.0 10/29/2014 0905   LDLCALC 133 (H) 02/06/2018 1027   Hepatic Function Panel     Component Value Date/Time   PROT 7.3 02/06/2018 1027   ALBUMIN 4.1 02/06/2018 1027   AST 8 02/06/2018 1027   ALT 14 02/06/2018 1027   ALKPHOS 132 (H) 02/06/2018 1027   BILITOT 0.3 02/06/2018 1027   BILIDIR 0.1 09/30/2015 1118      Component Value Date/Time   TSH 1.120 02/06/2018 1027   TSH 1.86 10/29/2014 0905   Results for WHISPER, KURKA (MRN  [id number]) as of 02/25/2018 09:51  Ref. Range 02/06/2018 10:27  Vitamin D, 25-Hydroxy Latest Ref Range: 30.0 - 100.0 ng/mL 36.8   ASSESSMENT AND PLAN: Prediabetes  Vitamin D deficiency - Plan: Vitamin D, Ergocalciferol, (DRISDOL) 50000 units CAPS capsule  At risk for osteoporosis  Class 1 obesity with serious comorbidity and body mass index (BMI) of 34.0 to 34.9 in adult, unspecified obesity type  PLAN:  Pre-Diabetes I discussed pre-diabetes in detail with Victoria Wagner and the registered  dietitian. Victoria Wagner will continue to work on weight loss, exercise, and decreasing simple carbohydrates in her diet to help decrease the risk of diabetes. We discussed metformin including benefits and risks. She was informed that eating too many simple carbohydrates or too many calories at one sitting increases the likelihood of GI side effects. Victoria Wagner declined metformin for now and a prescription was not written today. Victoria Wagner agreed to follow up with [place] as directed to monitor her progress in 2 weeks.  Vitamin D Deficiency Victoria Wagner was informed that low vitamin D levels contributes to fatigue and are associated with obesity, breast, and colon cancer. She agrees to begin to take prescription Vit D @50 ,000 IU, 1 capsule PO weekly #4 and will follow up for routine testing of vitamin D, at least 2-3 times per year. She was informed of the risk of over-replacement of vitamin D and agrees to not increase her dose unless she discusses this with [place] first. Andilyn agrees to follow up as directed.  At risk for osteopenia and osteoporosis Victoria Wagner was given extended (15 minutes) osteoporosis prevention counseling today. Victoria Wagner is at risk for osteopenia and osteoporosis due to her vitamin D deficiency. She was encouraged to take her vitamin D and follow her higher calcium diet and increase strengthening exercise to help strengthen her bones and decrease her risk of osteopenia and osteoporosis.  Obesity Victoria Wagner is currently in  the action stage of change. As such, her goal is to continue with weight loss efforts. She has agreed to follow the Category 2 plan and to increase protein and she will incorporate new options per the registered dietitian's suggestions. Victoria Wagner has been instructed to work up to a goal of 150 minutes of combined cardio and strengthening exercise per week for weight loss and overall health benefits. We discussed the following Behavioral Modification Strategies today: increasing lean protein intake, decreasing simple carbohydrates, increasing vegetables, increase H2O intake, decrease eating out, no skipping meals, work on meal planning and easy cooking plans, keeping healthy foods in the home, ways to avoid night time snacking, and better snacking choices.  Victoria Wagner has agreed to follow up with our clinic in 2 weeks. She was informed of the importance of frequent follow up visits to maximize her success with intensive lifestyle modifications for her multiple health conditions.   OBESITY BEHAVIORAL INTERVENTION VISIT  Today's visit was # 2   Starting weight: 207 lbs Starting date: 02/06/18 Today's weight : Weight: 205 lb (93 kg)  Today's date: 02/21/2018 Total lbs lost to date: 2  ASK: We discussed the diagnosis of obesity with Victoria Wagner today and Victoria Wagner agreed to give [place] permission to discuss obesity behavioral modification therapy today.  ASSESS: Victoria Wagner has the diagnosis of obesity and her BMI today is 34.11. Victoria Wagner is in the action stage of change.   ADVISE: Victoria Wagner was educated on the multiple health risks of obesity as well as the benefit of weight loss to improve her health. She was advised of the need for long term treatment and the importance of lifestyle modifications to improve her current health and to decrease her risk of future health problems.  AGREE: Multiple dietary modification options and treatment options were discussed and Victoria Wagner agreed to follow the  recommendations documented in the above note.  ARRANGE: Victoria Wagner was educated on the importance of frequent visits to treat obesity as outlined per CMS and USPSTF guidelines and agreed to schedule her next follow up appointment today.  IMarcille Blanco, am acting as Location manager  for [employer]. Owens Shark, DO  I have reviewed the above documentation for accuracy and completeness, and I agree with the above. -Jearld Lesch, DO

## 2018-02-26 ENCOUNTER — Other Ambulatory Visit: Payer: Commercial Managed Care - PPO

## 2018-03-05 ENCOUNTER — Encounter: Payer: Self-pay | Admitting: Primary Care

## 2018-03-05 ENCOUNTER — Ambulatory Visit (INDEPENDENT_AMBULATORY_CARE_PROVIDER_SITE_OTHER): Payer: Commercial Managed Care - PPO | Admitting: Primary Care

## 2018-03-05 VITALS — BP 120/78 | HR 78 | Temp 98.0°F | Ht 65.0 in | Wt 211.5 lb

## 2018-03-05 DIAGNOSIS — Z23 Encounter for immunization: Secondary | ICD-10-CM

## 2018-03-05 DIAGNOSIS — R030 Elevated blood-pressure reading, without diagnosis of hypertension: Secondary | ICD-10-CM

## 2018-03-05 DIAGNOSIS — E669 Obesity, unspecified: Secondary | ICD-10-CM

## 2018-03-05 DIAGNOSIS — R7303 Prediabetes: Secondary | ICD-10-CM

## 2018-03-05 DIAGNOSIS — Z0000 Encounter for general adult medical examination without abnormal findings: Secondary | ICD-10-CM

## 2018-03-05 DIAGNOSIS — Z6834 Body mass index (BMI) 34.0-34.9, adult: Secondary | ICD-10-CM

## 2018-03-05 DIAGNOSIS — G8929 Other chronic pain: Secondary | ICD-10-CM

## 2018-03-05 DIAGNOSIS — E66811 Obesity, class 1: Secondary | ICD-10-CM

## 2018-03-05 NOTE — Assessment & Plan Note (Signed)
Located to cervical spine, shoulders, upper extremities. Overall stable and following with pain management. Recommended regular exercise/stretching.

## 2018-03-05 NOTE — Assessment & Plan Note (Signed)
Tdap due, provided today. She will return for influenza vaccination.  Pap smear UTD. Encouraged her to continue to work on a healthy diet, start with regular exercise.  Exam stable. Labs reviewed from October 2019. Follow up in 1 year for CPE.

## 2018-03-05 NOTE — Addendum Note (Signed)
Addended by: Jacqualin Combes on: 03/05/2018 04:36 PM   Modules accepted: Orders

## 2018-03-05 NOTE — Progress Notes (Signed)
Subjective:    Patient ID: Victoria Wagner, female    DOB: 1981-03-30, 37 y.o.   MRN: [id number]  HPI  Victoria Wagner is a 37 year old female who presents today for complete physical. She is [facility] with [facility] [place].  Immunizations: -Tetanus: Unsure  -Influenza: Declines   Diet: She endorses a healthy diet Breakfast: Eggs, bread, [place] bacon  Lunch: Sandwich, fruit, lean cuisine  Dinner: Meat, vegetables  Snacks: Popcorn, 100 calorie oreo pack, lower calorie ice cream Desserts: Daily  Beverages: Water, diet soda  Exercise: She is not exercising  Eye exam: Completed several years ago Dental exam: Completes semi-annually  Pap Smear: Completed in 2019  BP Readings from Last 3 Encounters:  03/05/18 120/78  02/21/18 124/84  02/06/18 126/84   Wt Readings from Last 3 Encounters:  03/05/18 211 lb 8 oz (95.9 kg)  02/21/18 205 lb (93 kg)  02/06/18 207 lb (93.9 kg)     Review of Systems  Constitutional: Negative for unexpected weight change.  HENT: Negative for rhinorrhea.   Respiratory: Negative for cough and shortness of breath.   Cardiovascular: Negative for chest pain.  Gastrointestinal: Negative for constipation and diarrhea.  Genitourinary: Negative for difficulty urinating and menstrual problem.  Musculoskeletal: Positive for arthralgias.       Chronic pain to neck, shoulders, upper extremities  Skin: Negative for rash.  Allergic/Immunologic: Negative for environmental allergies.  Neurological: Negative for dizziness, numbness and headaches.       Past Medical History:  Diagnosis Date  . Anxiety   . Brachial neuritis   . Cataract    Post traumatic- per patient  . Cervical radiculopathy   . Cervical vertigo   . Degeneration of cervical intervertebral disc   . Depression   . Dizziness   . Headache   . Joint pain   . Muscle spasm   . Myalgia   . Myofascial pain   . Myositis   . Neuralgia   . Neuritis   .  Occipital headache   . Pain management   . Postpartum care following cesarean delivery (07/31/12) 08/01/2012  . S/P primary low transverse C-section (07/31/12) 08/01/2012  . Swallowing difficulty   . Weakness of both hands   . Weakness of both legs      Social History   Socioeconomic History  . Marital status: Married    Spouse name: Antyuan   . Number of children: 1  . Years of education: BA  . Highest education level: Not on file  Occupational History  . Occupation: Unemployed,disability  Social Needs  . Financial resource strain: Not on file  . Food insecurity:    Worry: Often true    Inability: Not on file  . Transportation needs:    Medical: Not on file    Non-medical: Not on file  Tobacco Use  . Smoking status: Never Smoker  . Smokeless tobacco: Never Used  Substance and Sexual Activity  . Alcohol use: No    Alcohol/week: 0.0 standard drinks  . Drug use: No  . Sexual activity: Not on file  Lifestyle  . Physical activity:    Days per week: Not on file    Minutes per session: Not on file  . Stress: Not on file  Relationships  . Social connections:    Talks on phone: Not on file    Gets together: Not on file    Attends religious service: Not on file  Active member of club or organization: Not on file    Attends meetings of clubs or organizations: Not on file    Relationship status: Not on file  . Intimate partner violence:    Fear of current or ex partner: Not on file    Emotionally abused: Not on file    Physically abused: Not on file    Forced sexual activity: Not on file  Other Topics Concern  . Not on file  Social History Narrative   Married   Right handed.   One child, daughter.   Caffeine use: Drinks green tea (Drinks 2-3 times per day)   Enjoys relaxing, spending time with her husband.    Past Surgical History:  Procedure Laterality Date  . CERVICAL FUSION  04/2010   C3-C6  . CESAREAN SECTION N/A 07/31/2012   Procedure: CESAREAN SECTION;   Surgeon: Lovenia Kim, MD;  Location: [facility];  Service: Obstetrics;  Laterality: N/A;   EDD: 08/18/12  . ECTOPIC PREGNANCY SURGERY  04/2005    No family history on file.  No Known Allergies  Current Outpatient Medications on File Prior to Visit  Medication Sig Dispense Refill  . cyclobenzaprine (FLEXERIL) 10 MG tablet Take 10 mg by mouth 3 (three) times daily.     Marland Kitchen NUVARING 0.12-0.015 MG/24HR vaginal ring Place 1 each vaginally every 28 (twenty-eight) days.   11  . OXcarbazepine ER (OXTELLAR XR) 600 MG TB24 1 tab by mouth three times daily per pt report 60 tablet 0  . Vitamin D, Ergocalciferol, (DRISDOL) 50000 units CAPS capsule Take 1 capsule (50,000 Units total) by mouth every 7 (seven) days. 4 capsule 0   No current facility-administered medications on file prior to visit.     BP 120/78   Pulse 78   Temp 98 F (36.7 C) (Oral)   Ht 5\' 5"  (1.651 m)   Wt 211 lb 8 oz (95.9 kg)   SpO2 98%   BMI 35.20 kg/m    Objective:   Physical Exam  Constitutional: She is oriented to person, place, and time. She appears well-nourished.  HENT:  Mouth/Throat: No oropharyngeal exudate.  Eyes: Pupils are equal, round, and reactive to light. EOM are normal.  Neck: Neck supple. No thyromegaly present.  Cardiovascular: Normal rate and regular rhythm.  Respiratory: Effort normal and breath sounds normal.  GI: Soft. Bowel sounds are normal. There is no tenderness.  Musculoskeletal: Normal range of motion.  Stable, chronic decrease in ROM to cervical spine and shoulders.   Neurological: She is alert and oriented to person, place, and time.  Skin: Skin is warm and dry.  Psychiatric: She has a normal mood and affect.           Assessment & Plan:

## 2018-03-05 NOTE — Assessment & Plan Note (Signed)
[facility], commended her on this. She appears to be sticking to her diet, recommended regular exercise. Continue follow up. Labs reviewed from that visit.

## 2018-03-05 NOTE — Patient Instructions (Signed)
Continue to work on [employer], you are on the right track!  Start exercising. You should be getting 150 minutes of moderate intensity exercise weekly.  Ensure you are consuming 64 ounces of water daily.  Please be sure that they are following up with the A1C test. I recommend we repeat this in 6 months. Please have this done either in our office or their office.  We will see you in one year for your annual exam or sooner if needed.  It was a pleasure to see you today!   Preventive Care 18-39 Years, Female Preventive care refers to lifestyle choices and visits with your health care provider that can promote health and wellness. What does preventive care include?  A yearly physical exam. This is also called an annual well check.  Dental exams once or twice a year.  Routine eye exams. Ask your health care provider how often you should have your eyes checked.  Personal lifestyle choices, including: ? Daily care of your teeth and gums. ? Regular physical activity. ? Eating a healthy diet. ? Avoiding tobacco and drug use. ? Limiting alcohol use. ? Practicing safe sex. ? Taking vitamin and mineral supplements as recommended by your health care provider. What happens during an annual well check? The services and screenings done by your health care provider during your annual well check will depend on your age, overall health, lifestyle risk factors, and family history of disease. Counseling Your health care provider may ask you questions about your:  Alcohol use.  Tobacco use.  Drug use.  Emotional well-being.  Home and relationship well-being.  Sexual activity.  Eating habits.  Work and work Statistician.  Method of birth control.  Menstrual cycle.  Pregnancy history.  Screening You may have the following tests or measurements:  Height, weight, and BMI.  Diabetes screening. This is done by checking your blood sugar (glucose) after you have not eaten for a while  (fasting).  Blood pressure.  Lipid and cholesterol levels. These may be checked every 5 years starting at age 24.  Skin check.  Hepatitis C blood test.  Hepatitis B blood test.  Sexually transmitted disease (STD) testing.  BRCA-related cancer screening. This may be done if you have a family history of breast, ovarian, tubal, or peritoneal cancers.  Pelvic exam and Pap test. This may be done every 3 years starting at age 4. Starting at age 54, this may be done every 5 years if you have a Pap test in combination with an HPV test.  Discuss your test results, treatment options, and if necessary, the need for more tests with your health care provider. Vaccines Your health care provider may recommend certain vaccines, such as:  Influenza vaccine. This is recommended every year.  Tetanus, diphtheria, and acellular pertussis (Tdap, Td) vaccine. You may need a Td booster every 10 years.  Varicella vaccine. You may need this if you have not been vaccinated.  HPV vaccine. If you are 64 or younger, you may need three doses over 6 months.  Measles, mumps, and rubella (MMR) vaccine. You may need at least one dose of MMR. You may also need a second dose.  Pneumococcal 13-valent conjugate (PCV13) vaccine. You may need this if you have certain conditions and were not previously vaccinated.  Pneumococcal polysaccharide (PPSV23) vaccine. You may need one or two doses if you smoke cigarettes or if you have certain conditions.  Meningococcal vaccine. One dose is recommended if you are age 81-21 years and  a Market researcher living in a residence hall, or if you have one of several medical conditions. You may also need additional booster doses.  Hepatitis A vaccine. You may need this if you have certain conditions or if you travel or work in places where you may be exposed to hepatitis A.  Hepatitis B vaccine. You may need this if you have certain conditions or if you travel or work in  places where you may be exposed to hepatitis B.  Haemophilus influenzae type b (Hib) vaccine. You may need this if you have certain risk factors.  Talk to your health care provider about which screenings and vaccines you need and how often you need them. This information is not intended to replace advice given to you by your health care provider. Make sure you discuss any questions you have with your health care provider. Document Released: 06/06/2001 Document Revised: 12/29/2015 Document Reviewed: 02/09/2015 Elsevier Interactive Patient Education  [employer].

## 2018-03-05 NOTE — Assessment & Plan Note (Signed)
Recent A1C of 5.7. Recommended to continue to work on diet, start with regular exercise. Continue to monitor.

## 2018-03-05 NOTE — Assessment & Plan Note (Signed)
Normal in the office today. Continue to monitor.

## 2018-03-06 ENCOUNTER — Ambulatory Visit (INDEPENDENT_AMBULATORY_CARE_PROVIDER_SITE_OTHER): Payer: Commercial Managed Care - PPO | Admitting: Psychology

## 2018-03-06 DIAGNOSIS — F330 Major depressive disorder, recurrent, mild: Secondary | ICD-10-CM | POA: Diagnosis not present
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
## 2018-03-07 ENCOUNTER — Ambulatory Visit (INDEPENDENT_AMBULATORY_CARE_PROVIDER_SITE_OTHER): Payer: Commercial Managed Care - PPO | Admitting: Bariatrics

## 2018-03-07 ENCOUNTER — Encounter (INDEPENDENT_AMBULATORY_CARE_PROVIDER_SITE_OTHER): Payer: Self-pay | Admitting: Bariatrics

## 2018-03-07 VITALS — BP 124/83 | HR 93 | Temp 98.4°F | Ht 65.0 in | Wt 205.0 lb

## 2018-03-07 DIAGNOSIS — E559 Vitamin D deficiency, unspecified: Secondary | ICD-10-CM | POA: Diagnosis not present

## 2018-03-07 DIAGNOSIS — E669 Obesity, unspecified: Secondary | ICD-10-CM | POA: Diagnosis not present

## 2018-03-07 DIAGNOSIS — R7303 Prediabetes: Secondary | ICD-10-CM | POA: Diagnosis not present

## 2018-03-07 DIAGNOSIS — Z6834 Body mass index (BMI) 34.0-34.9, adult: Secondary | ICD-10-CM

## 2018-03-08 ENCOUNTER — Telehealth: Payer: Self-pay | Admitting: Primary Care

## 2018-03-08 NOTE — Telephone Encounter (Signed)
Completed form and placed in Chan's inbox.

## 2018-03-08 NOTE — Telephone Encounter (Signed)
Place form/paperwork in Victoria Wagner's inbox for review and complete if necessary.  

## 2018-03-08 NOTE — Telephone Encounter (Signed)
Patient dropped off a physical verification form to be completed by K. Clark. Placed in [place] tower.

## 2018-03-11 DIAGNOSIS — G894 Chronic pain syndrome: Secondary | ICD-10-CM | POA: Diagnosis not present

## 2018-03-11 DIAGNOSIS — M25511 Pain in right shoulder: Secondary | ICD-10-CM | POA: Diagnosis not present

## 2018-03-11 DIAGNOSIS — M961 Postlaminectomy syndrome, not elsewhere classified: Secondary | ICD-10-CM | POA: Diagnosis not present

## 2018-03-12 NOTE — Progress Notes (Signed)
Office: [phone]  /  Fax: [phone]   HPI:   Chief Complaint: OBESITY Victoria Wagner is here to discuss her progress with her obesity treatment plan. She is on the Category 2 plan and is following her eating plan approximately 98 % of the time. She states she is exercising 0 minutes 0 times per week. Leonilda has hit a "stand off" in her weight loss. She states that her hunger is decreased.  Her weight is 205 lb (93 kg) today and has not lost weight since her last visit. She has lost 2 lbs since starting treatment with [place].  Vitamin D deficiency Victoria Wagner has a diagnosis of vitamin D deficiency with her last level was 36.8 on 02/06/18. She is currently taking high dose vit D and denies nausea, vomiting, or muscle weakness.  Pre-Diabetes Victoria Wagner has a diagnosis of pre-diabetes based on her elevated Hgb A1c and was informed this puts her at greater risk of developing diabetes. Her last A1c was 5.7 and Insulin was 15.6 on 02/06/18. She is not taking metformin currently and continues to work on diet and exercise to decrease risk of diabetes. She denies polyphagia.  ALLERGIES: No Known Allergies  MEDICATIONS: Current Outpatient Medications on File Prior to Visit  Medication Sig Dispense Refill  . cyclobenzaprine (FLEXERIL) 10 MG tablet Take 10 mg by mouth 3 (three) times daily.     Marland Kitchen NUVARING 0.12-0.015 MG/24HR vaginal ring Place 1 each vaginally every 28 (twenty-eight) days.   11  . OXcarbazepine ER (OXTELLAR XR) 600 MG TB24 1 tab by mouth three times daily per pt report 60 tablet 0  . Vitamin D, Ergocalciferol, (DRISDOL) 50000 units CAPS capsule Take 1 capsule (50,000 Units total) by mouth every 7 (seven) days. 4 capsule 0   No current facility-administered medications on file prior to visit.     PAST MEDICAL HISTORY: Past Medical History:  Diagnosis Date  . Anxiety   . Brachial neuritis   . Cataract    Post traumatic- per patient  . Cervical radiculopathy   . Cervical vertigo   .  Degeneration of cervical intervertebral disc   . Depression   . Dizziness   . Headache   . Joint pain   . Muscle spasm   . Myalgia   . Myofascial pain   . Myositis   . Neuralgia   . Neuritis   . Occipital headache   . Pain management   . Postpartum care following cesarean delivery (07/31/12) 08/01/2012  . S/P primary low transverse C-section (07/31/12) 08/01/2012  . Swallowing difficulty   . Weakness of both hands   . Weakness of both legs     PAST SURGICAL HISTORY: Past Surgical History:  Procedure Laterality Date  . CERVICAL FUSION  04/2010   C3-C6  . CESAREAN SECTION N/A 07/31/2012   Procedure: CESAREAN SECTION;  Surgeon: Lovenia Kim, MD;  Location: [facility];  Service: Obstetrics;  Laterality: N/A;   EDD: 08/18/12  . ECTOPIC PREGNANCY SURGERY  04/2005    SOCIAL HISTORY: Social History   Tobacco Use  . Smoking status: Never Smoker  . Smokeless tobacco: Never Used  Substance Use Topics  . Alcohol use: No    Alcohol/week: 0.0 standard drinks  . Drug use: No    FAMILY HISTORY: No family history on file.  ROS: Review of Systems  Constitutional: Negative for weight loss.  Gastrointestinal: Negative for nausea and vomiting.  Musculoskeletal:       Negative for muscle weakness.  Endo/Heme/Allergies:  Negative for polyphagia.    PHYSICAL EXAM: Blood pressure 124/83, pulse 93, temperature 98.4 F (36.9 C), temperature source Oral, height 5\' 5"  (1.651 m), weight 205 lb (93 kg), SpO2 97 %. Body mass index is 34.11 kg/m. Physical Exam  Constitutional: She is oriented to person, place, and time. She appears well-developed and well-nourished.  Cardiovascular: Normal rate.  Pulmonary/Chest: Effort normal.  Musculoskeletal: Normal range of motion.  Neurological: She is oriented to person, place, and time.  Skin: Skin is warm and dry.  Psychiatric: She has a normal mood and affect. Her behavior is normal.  Vitals reviewed.   RECENT LABS AND TESTS: BMET      Component Value Date/Time   NA 143 02/06/2018 1027   K 4.0 02/06/2018 1027   CL 104 02/06/2018 1027   CO2 23 02/06/2018 1027   GLUCOSE 83 02/06/2018 1027   GLUCOSE 86 07/09/2017 1936   BUN 14 02/06/2018 1027   CREATININE 1.01 (H) 02/06/2018 1027   CREATININE 0.86 08/21/2014 1531   CALCIUM 9.4 02/06/2018 1027   GFRNONAA 71 02/06/2018 1027   GFRAA 82 02/06/2018 1027   Lab Results  Component Value Date   HGBA1C 5.7 (H) 02/06/2018   Lab Results  Component Value Date   INSULIN 15.6 02/06/2018   CBC    Component Value Date/Time   WBC 6.1 02/06/2018 1027   WBC 8.1 07/09/2017 1936   RBC 4.94 02/06/2018 1027   RBC 4.78 07/09/2017 1936   HGB 12.9 02/06/2018 1027   HCT 39.6 02/06/2018 1027   PLT 297 07/09/2017 1936   MCV 80 02/06/2018 1027   MCH 26.1 (L) 02/06/2018 1027   MCH 26.6 07/09/2017 1936   MCHC 32.6 02/06/2018 1027   MCHC 31.8 07/09/2017 1936   RDW 13.3 02/06/2018 1027   LYMPHSABS 2.3 02/06/2018 1027   MONOABS 0.4 09/30/2015 1118   EOSABS 0.1 02/06/2018 1027   BASOSABS 0.0 02/06/2018 1027   Iron/TIBC/Ferritin/ %Sat No results found for: IRON, TIBC, FERRITIN, IRONPCTSAT Lipid Panel     Component Value Date/Time   CHOL 210 (H) 02/06/2018 1027   TRIG 82 02/06/2018 1027   HDL 61 02/06/2018 1027   CHOLHDL 4 10/29/2014 0905   VLDL 21.0 10/29/2014 0905   LDLCALC 133 (H) 02/06/2018 1027   Hepatic Function Panel     Component Value Date/Time   PROT 7.3 02/06/2018 1027   ALBUMIN 4.1 02/06/2018 1027   AST 8 02/06/2018 1027   ALT 14 02/06/2018 1027   ALKPHOS 132 (H) 02/06/2018 1027   BILITOT 0.3 02/06/2018 1027   BILIDIR 0.1 09/30/2015 1118      Component Value Date/Time   TSH 1.120 02/06/2018 1027   TSH 1.86 10/29/2014 0905   Results for AILA, TERRA (MRN [id number]) as of 03/12/2018 07:08  Ref. Range 02/06/2018 10:27  Vitamin D, 25-Hydroxy Latest Ref Range: 30.0 - 100.0 ng/mL 36.8   ASSESSMENT AND PLAN: Vitamin D deficiency  Prediabetes  Class 1  obesity with serious comorbidity and body mass index (BMI) of 34.0 to 34.9 in adult, unspecified obesity type  PLAN:  Vitamin D Deficiency Victoria Wagner was informed that low vitamin D levels contributes to fatigue and are associated with obesity, breast, and colon cancer. She agrees to continue to take prescription Vit D @50 ,000 IU every week and will follow up for routine testing of vitamin D, at least 2-3 times per year. She was informed of the risk of over-replacement of vitamin D and agrees to not increase her dose  unless she discusses this with [place] first. Victoria Wagner agrees to follow up in 2 weeks.  Pre-Diabetes Victoria Wagner will continue to work on weight loss, exercise, and decreasing simple carbohydrates in her diet to help decrease the risk of diabetes. She was informed that eating too many simple carbohydrates or too many calories at one sitting increases the likelihood of GI side effects.She agrees to decrease carbohydrates and increase protein. She will reserve a snack for the evening. Victoria Wagner agreed to follow up with [place] as directed to monitor her progress.  I spent > than 50% of the 15 minute visit on counseling as documented in the note.  Obesity Victoria Wagner is currently in the action stage of change. As such, her goal is to continue with weight loss efforts She has agreed to follow the Category 2 plan with the additional breakfast options. Victoria Wagner agrees to increase her water intake, decrease bread with meal and substitute for yogurt. She was given additional Category 1 and 2 breakfast options. She will talk with pain management about activities. Victoria Wagner has been instructed to work up to a goal of 150 minutes of combined cardio and strengthening exercise per week for weight loss and overall health benefits. We discussed the following Behavioral Modification Strategies today: increasing lean protein intake, decreasing simple carbohydrates, increasing vegetables, increase H2O intake, decrease eating out,  no skipping meals, and work on meal planning and easy cooking plans.  Victoria Wagner has agreed to follow up with our clinic in 2 weeks. She was informed of the importance of frequent follow up visits to maximize her success with intensive lifestyle modifications for her multiple health conditions.   OBESITY BEHAVIORAL INTERVENTION VISIT  Today's visit was # 3   Starting weight: 207 lbs Starting date: 02/06/18 Today's weight : Weight: 205 lb (93 kg)  Today's date: 03/07/2018 Total lbs lost to date: 2  ASK: We discussed the diagnosis of obesity with Victoria Wagner today and Victoria Wagner agreed to give [place] permission to discuss obesity behavioral modification therapy today.  ASSESS: Akyah has the diagnosis of obesity and her BMI today is 34.11. Victoria Wagner is in the action stage of change.   ADVISE: Victoria Wagner was educated on the multiple health risks of obesity as well as the benefit of weight loss to improve her health. She was advised of the need for long term treatment and the importance of lifestyle modifications to improve her current health and to decrease her risk of future health problems.  AGREE: Multiple dietary modification options and treatment options were discussed and Victoria Wagner agreed to follow the recommendations documented in the above note.  ARRANGE: Victoria Wagner was educated on the importance of frequent visits to treat obesity as outlined per CMS and USPSTF guidelines and agreed to schedule her next follow up appointment today.  I, Marcille Blanco, am acting as Location manager for [employer]. Owens Shark, DO  I have reviewed the above documentation for accuracy and completeness, and I agree with the above. -Jearld Lesch, DO

## 2018-03-12 NOTE — Telephone Encounter (Signed)
Spoken to patient and forms have been faxed as requested.  Left the original for patient to pick up in the front office.

## 2018-03-18 ENCOUNTER — Ambulatory Visit (INDEPENDENT_AMBULATORY_CARE_PROVIDER_SITE_OTHER): Payer: Commercial Managed Care - PPO | Admitting: Bariatrics

## 2018-03-18 VITALS — BP 139/83 | HR 94 | Temp 97.8°F | Ht 65.0 in | Wt 204.0 lb

## 2018-03-18 DIAGNOSIS — R7303 Prediabetes: Secondary | ICD-10-CM | POA: Diagnosis not present

## 2018-03-18 DIAGNOSIS — E559 Vitamin D deficiency, unspecified: Secondary | ICD-10-CM

## 2018-03-18 DIAGNOSIS — Z9189 Other specified personal risk factors, not elsewhere classified: Secondary | ICD-10-CM | POA: Diagnosis not present

## 2018-03-18 DIAGNOSIS — E669 Obesity, unspecified: Secondary | ICD-10-CM | POA: Diagnosis not present

## 2018-03-18 DIAGNOSIS — Z6834 Body mass index (BMI) 34.0-34.9, adult: Secondary | ICD-10-CM

## 2018-03-18 MED ORDER — METFORMIN HCL 500 MG PO TABS: 500.0000 mg | ORAL_TABLET | Freq: Every day | ORAL | 0 refills | Status: DC

## 2018-03-25 NOTE — Progress Notes (Signed)
Office: [phone]  /  Fax: [phone]   HPI:   Chief Complaint: OBESITY Victoria Wagner is here to discuss her progress with her obesity treatment plan. She is on the Category 2 plan and is following her eating plan approximately 98 to 100 % of the time. She states she is exercising 0 minutes 0 times per week. Victoria Wagner "feels like she should have lost more weight". She is following the plan and decreased bread. She has talked to pain management about exercise. She can do water aerobics and is limited on the treadmill.  Her weight is 204 lb (92.5 kg) today and has had a weight loss of 1 pound over a period of 2 weeks since her last visit. She has lost 3 lbs since starting treatment with [place].  Vitamin D deficiency Victoria Wagner has a diagnosis of vitamin D deficiency. She is currently taking high dose vit D and her last vitamin D Level was 36.8 on 02/06/18. She denies nausea, vomiting, or muscle weakness.  Pre-Diabetes Victoria Wagner has a diagnosis of prediabetes based on her elevated Hgb A1c and was informed this puts her at greater risk of developing diabetes. She is not taking medications currently and continues to work on diet and exercise to decrease risk of diabetes. Her last A1c was 5.7 and Insulin was 15.6 on 02/06/18. She denies nausea or significant polyphagia..  At risk for diabetes Victoria Wagner is at higher than average risk for developing diabetes due to her pre-diabetes and obesity. She currently denies polyuria or polydipsia.  ALLERGIES: No Known Allergies  MEDICATIONS: Current Outpatient Medications on File Prior to Visit  Medication Sig Dispense Refill  . cyclobenzaprine (FLEXERIL) 10 MG tablet Take 10 mg by mouth 3 (three) times daily.     Marland Kitchen NUVARING 0.12-0.015 MG/24HR vaginal ring Place 1 each vaginally every 28 (twenty-eight) days.   11  . OXcarbazepine ER (OXTELLAR XR) 600 MG TB24 1 tab by mouth three times daily per pt report 60 tablet 0  . Vitamin D, Ergocalciferol, (DRISDOL) 50000  units CAPS capsule Take 1 capsule (50,000 Units total) by mouth every 7 (seven) days. 4 capsule 0   No current facility-administered medications on file prior to visit.     PAST MEDICAL HISTORY: Past Medical History:  Diagnosis Date  . Anxiety   . Brachial neuritis   . Cataract    Post traumatic- per patient  . Cervical radiculopathy   . Cervical vertigo   . Degeneration of cervical intervertebral disc   . Depression   . Dizziness   . Headache   . Joint pain   . Muscle spasm   . Myalgia   . Myofascial pain   . Myositis   . Neuralgia   . Neuritis   . Occipital headache   . Pain management   . Postpartum care following cesarean delivery (07/31/12) 08/01/2012  . S/P primary low transverse C-section (07/31/12) 08/01/2012  . Swallowing difficulty   . Weakness of both hands   . Weakness of both legs     PAST SURGICAL HISTORY: Past Surgical History:  Procedure Laterality Date  . CERVICAL FUSION  04/2010   C3-C6  . CESAREAN SECTION N/A 07/31/2012   Procedure: CESAREAN SECTION;  Surgeon: Lovenia Kim, MD;  Location: [facility];  Service: Obstetrics;  Laterality: N/A;   EDD: 08/18/12  . ECTOPIC PREGNANCY SURGERY  04/2005    SOCIAL HISTORY: Social History   Tobacco Use  . Smoking status: Never Smoker  . Smokeless tobacco: Never Used  Substance Use Topics  . Alcohol use: No    Alcohol/week: 0.0 standard drinks  . Drug use: No    FAMILY HISTORY: No family history on file.  ROS: Review of Systems  Constitutional: Positive for weight loss.  Gastrointestinal: Negative for nausea and vomiting.  Genitourinary:       Negative for polyuria.  Musculoskeletal:       Negative for muscle weakness.  Endo/Heme/Allergies: Negative for polydipsia.       Negative for significant polyphagia.    PHYSICAL EXAM: Blood pressure 139/83, pulse 94, temperature 97.8 F (36.6 C), temperature source Oral, height 5\' 5"  (1.651 m), weight 204 lb (92.5 kg), SpO2 98 %. Body mass index is 33.95  kg/m. Physical Exam  Constitutional: She is oriented to person, place, and time. She appears well-developed and well-nourished.  Cardiovascular: Normal rate.  Pulmonary/Chest: Effort normal.  Musculoskeletal: Normal range of motion.  Neurological: She is oriented to person, place, and time.  Skin: Skin is warm and dry.  Vitals reviewed.   RECENT LABS AND TESTS: BMET    Component Value Date/Time   NA 143 02/06/2018 1027   K 4.0 02/06/2018 1027   CL 104 02/06/2018 1027   CO2 23 02/06/2018 1027   GLUCOSE 83 02/06/2018 1027   GLUCOSE 86 07/09/2017 1936   BUN 14 02/06/2018 1027   CREATININE 1.01 (H) 02/06/2018 1027   CREATININE 0.86 08/21/2014 1531   CALCIUM 9.4 02/06/2018 1027   GFRNONAA 71 02/06/2018 1027   GFRAA 82 02/06/2018 1027   Lab Results  Component Value Date   HGBA1C 5.7 (H) 02/06/2018   Lab Results  Component Value Date   INSULIN 15.6 02/06/2018   CBC    Component Value Date/Time   WBC 6.1 02/06/2018 1027   WBC 8.1 07/09/2017 1936   RBC 4.94 02/06/2018 1027   RBC 4.78 07/09/2017 1936   HGB 12.9 02/06/2018 1027   HCT 39.6 02/06/2018 1027   PLT 297 07/09/2017 1936   MCV 80 02/06/2018 1027   MCH 26.1 (L) 02/06/2018 1027   MCH 26.6 07/09/2017 1936   MCHC 32.6 02/06/2018 1027   MCHC 31.8 07/09/2017 1936   RDW 13.3 02/06/2018 1027   LYMPHSABS 2.3 02/06/2018 1027   MONOABS 0.4 09/30/2015 1118   EOSABS 0.1 02/06/2018 1027   BASOSABS 0.0 02/06/2018 1027   Iron/TIBC/Ferritin/ %Sat No results found for: IRON, TIBC, FERRITIN, IRONPCTSAT Lipid Panel     Component Value Date/Time   CHOL 210 (H) 02/06/2018 1027   TRIG 82 02/06/2018 1027   HDL 61 02/06/2018 1027   CHOLHDL 4 10/29/2014 0905   VLDL 21.0 10/29/2014 0905   LDLCALC 133 (H) 02/06/2018 1027   Hepatic Function Panel     Component Value Date/Time   PROT 7.3 02/06/2018 1027   ALBUMIN 4.1 02/06/2018 1027   AST 8 02/06/2018 1027   ALT 14 02/06/2018 1027   ALKPHOS 132 (H) 02/06/2018 1027    BILITOT 0.3 02/06/2018 1027   BILIDIR 0.1 09/30/2015 1118      Component Value Date/Time   TSH 1.120 02/06/2018 1027   TSH 1.86 10/29/2014 0905   Results for ROSSELYN, MARTHA (MRN [id number]) as of 03/25/2018 06:21  Ref. Range 02/06/2018 10:27  Vitamin D, 25-Hydroxy Latest Ref Range: 30.0 - 100.0 ng/mL 36.8   ASSESSMENT AND PLAN: Vitamin D deficiency  Prediabetes - Plan: metFORMIN (GLUCOPHAGE) 500 MG tablet  At risk for diabetes mellitus  Class 1 obesity with serious comorbidity and body mass index (BMI)  of 34.0 to 34.9 in adult, unspecified obesity type  PLAN:  Vitamin D Deficiency Victoria Wagner was informed that low vitamin D levels contributes to fatigue and are associated with obesity, breast, and colon cancer. She agrees to continue to take prescription Vit D @50 ,000 IU every week and will follow up for routine testing of vitamin D, at least 2-3 times per year. She was informed of the risk of over-replacement of vitamin D and agrees to not increase her dose unless she discusses this with [place] first. Victoria Wagner agrees to follow up in 2 weeks.  Pre-Diabetes Victoria Wagner will continue to work on weight loss, exercise, and decreasing simple carbohydrates in her diet to help decrease the risk of diabetes. We discussed metformin including benefits and risks. She was informed that eating too many simple carbohydrates or too many calories at one sitting increases the likelihood of GI side effects. Victoria Wagner agreed to take metformin 500mg , 1 tablet qAM with breakfast #30 with no refills and a prescription was written today. Victoria Wagner agreed to follow up with [place] as directed to monitor her progress.  Diabetes risk counseling Victoria Wagner was given extended (15 minutes) diabetes prevention counseling today. She is 37 y.o. female and has risk factors for diabetes including pre-diabetes and obesity. We discussed intensive lifestyle modifications today with an emphasis on weight loss as well as increasing exercise and  decreasing simple carbohydrates in her diet.  Obesity Victoria Wagner is currently in the action stage of change. As such, her goal is to continue with weight loss efforts. She has agreed to follow the Category 2 plan + 100 calorie snacks and to reduce bread with meals. Victoria Wagner has been instructed to work up to a goal of 150 minutes of combined cardio and strengthening exercise per week for weight loss and overall health benefits. We discussed the following Behavioral Modification Strategies today: increasing lean protein intake, decreasing simple carbohydrates, increasing vegetables, increase H2O intake, celebration eating strategies, and holiday eating strategies.  We discussed meal planning with holiday eating.  Victoria Wagner has agreed to follow up with our clinic in 2 weeks. She was informed of the importance of frequent follow up visits to maximize her success with intensive lifestyle modifications for her multiple health conditions.   OBESITY BEHAVIORAL INTERVENTION VISIT  Today's visit was # 4   Starting weight: 207 lbs Starting date: 02/06/18 Today's weight : Weight: 204 lb (92.5 kg)  Today's date: 03/18/2018 Total lbs lost to date: 3  ASK: We discussed the diagnosis of obesity with Victoria Wagner today and Victoria Wagner agreed to give [place] permission to discuss obesity behavioral modification therapy today.  ASSESS: Victoria Wagner has the diagnosis of obesity and her BMI today is 33.95. Victoria Wagner is in the action stage of change.   ADVISE: Victoria Wagner was educated on the multiple health risks of obesity as well as the benefit of weight loss to improve her health. She was advised of the need for long term treatment and the importance of lifestyle modifications to improve her current health and to decrease her risk of future health problems.  AGREE: Multiple dietary modification options and treatment options were discussed and [place] agreed to follow the recommendations documented in the above  note.  ARRANGE: Suraya was educated on the importance of frequent visits to treat obesity as outlined per CMS and USPSTF guidelines and agreed to schedule her next follow up appointment today.  I, Marcille Blanco, am acting as Location manager for [employer]. Owens Shark, DO  I have reviewed the above documentation for  accuracy and completeness, and I agree with the above. -Jearld Lesch, DO

## 2018-03-27 ENCOUNTER — Ambulatory Visit (INDEPENDENT_AMBULATORY_CARE_PROVIDER_SITE_OTHER): Payer: Self-pay | Admitting: Psychology

## 2018-04-01 ENCOUNTER — Ambulatory Visit (INDEPENDENT_AMBULATORY_CARE_PROVIDER_SITE_OTHER): Payer: Self-pay | Admitting: Bariatrics

## 2018-04-02 DIAGNOSIS — M5010 Cervical disc disorder with radiculopathy, unspecified cervical region: Secondary | ICD-10-CM | POA: Diagnosis not present

## 2018-04-02 DIAGNOSIS — M961 Postlaminectomy syndrome, not elsewhere classified: Secondary | ICD-10-CM | POA: Diagnosis not present

## 2018-04-02 DIAGNOSIS — M5032 Other cervical disc degeneration, mid-cervical region, unspecified level: Secondary | ICD-10-CM | POA: Diagnosis not present

## 2018-04-08 ENCOUNTER — Ambulatory Visit (INDEPENDENT_AMBULATORY_CARE_PROVIDER_SITE_OTHER): Payer: Commercial Managed Care - PPO | Admitting: Bariatrics

## 2018-04-08 ENCOUNTER — Encounter (INDEPENDENT_AMBULATORY_CARE_PROVIDER_SITE_OTHER): Payer: Self-pay | Admitting: Bariatrics

## 2018-04-08 VITALS — BP 128/84 | HR 88 | Temp 98.0°F | Ht 65.0 in | Wt 206.0 lb

## 2018-04-08 DIAGNOSIS — Z9189 Other specified personal risk factors, not elsewhere classified: Secondary | ICD-10-CM | POA: Diagnosis not present

## 2018-04-08 DIAGNOSIS — E669 Obesity, unspecified: Secondary | ICD-10-CM | POA: Diagnosis not present

## 2018-04-08 DIAGNOSIS — E559 Vitamin D deficiency, unspecified: Secondary | ICD-10-CM | POA: Diagnosis not present

## 2018-04-08 DIAGNOSIS — R7303 Prediabetes: Secondary | ICD-10-CM | POA: Diagnosis not present

## 2018-04-08 DIAGNOSIS — Z6834 Body mass index (BMI) 34.0-34.9, adult: Secondary | ICD-10-CM

## 2018-04-08 MED ORDER — VITAMIN D (ERGOCALCIFEROL) 1.25 MG (50000 UNIT) PO CAPS: 50000.0000 [IU] | ORAL_CAPSULE | ORAL | 0 refills | Status: DC

## 2018-04-08 MED ORDER — METFORMIN HCL 500 MG PO TABS: 500.0000 mg | ORAL_TABLET | Freq: Two times a day (BID) | ORAL | 0 refills | Status: DC

## 2018-04-08 NOTE — Progress Notes (Signed)
Office: [phone]  /  Fax: [phone]   HPI:   Chief Complaint: OBESITY Victoria Wagner is here to discuss her progress with her obesity treatment plan. She is on the Category 2 plan +100 calories and is following her eating plan approximately 20 to 30 % of the time. She states she is exercising 0 minutes 0 times per week. Liya states that her appetite has increased, secondary to epidermal steroid injection. Her weight is 206 lb (93.4 kg) today and has had a weight gain of 2 pounds over a period of 3 weeks since her last visit. She has lost 1 lb since starting treatment with [place].  Vitamin D deficiency Victoria Wagner has a diagnosis of vitamin D deficiency. She is currently taking vit D and denies nausea, vomiting or muscle weakness.  At risk for osteopenia and osteoporosis Victoria Wagner is at higher risk of osteopenia and osteoporosis due to vitamin D deficiency.   Pre-Diabetes Victoria Wagner has a diagnosis of prediabetes based on her elevated Hgb A1c and was informed this puts her at greater risk of developing diabetes. She is taking metformin currently and continues to work on diet and exercise to decrease risk of diabetes. She admits to increased polyphagia.  ASSESSMENT AND PLAN:  Vitamin D deficiency - Plan: Vitamin D, Ergocalciferol, (DRISDOL) 1.25 MG (50000 UT) CAPS capsule  Prediabetes - Plan: metFORMIN (GLUCOPHAGE) 500 MG tablet  At risk for osteoporosis  Class 1 obesity with serious comorbidity and body mass index (BMI) of 34.0 to 34.9 in adult, unspecified obesity type  PLAN:  Vitamin D Deficiency Victoria Wagner was informed that low vitamin D levels contributes to fatigue and are associated with obesity, breast, and colon cancer. She agrees to continue to take prescription Vit D @50 ,000 IU every week #4 with no refills and will follow up for routine testing of vitamin D, at least 2-3 times per year. She was informed of the risk of over-replacement of vitamin D and agrees to not increase her dose  unless she discusses this with [place] first. Alfredo agrees to follow up as directed.  At risk for osteopenia and osteoporosis Victoria Wagner was given extended  (15 minutes) osteoporosis prevention counseling today. Victoria Wagner is at risk for osteopenia and osteoporosis due to her vitamin D deficiency. She was encouraged to take her vitamin D and follow her higher calcium diet and increase strengthening exercise to help strengthen her bones and decrease her risk of osteopenia and osteoporosis.  Pre-Diabetes Victoria Wagner will continue to work on weight loss, exercise, and decreasing simple carbohydrates in her diet to help decrease the risk of diabetes. We dicussed metformin including benefits and risks. She was informed that eating too many simple carbohydrates or too many calories at one sitting increases the likelihood of GI side effects. Victoria Wagner agreed to increase metformin to 500 mg 1 tablet BID #60 with no refills and follow up with [place] as directed to monitor her progress.  Obesity Victoria Wagner is currently in the action stage of change. As such, her goal is to continue with weight loss efforts She has agreed to follow the Category 2 plan +100 calories Victoria Wagner has been instructed to work up to a goal of 150 minutes of combined cardio and strengthening exercise per week for weight loss and overall health benefits. We discussed the following Behavioral Modification Strategies today: decreasing bread, increase H2O intake, better snacking choices, increasing lean protein intake, decreasing simple carbohydrates , increasing vegetables, work on meal planning and easy cooking plans and avoiding temptations  Victoria Wagner has agreed  to follow up with our clinic in 2 weeks. She was informed of the importance of frequent follow up visits to maximize her success with intensive lifestyle modifications for her multiple health conditions.  ALLERGIES: No Known Allergies  MEDICATIONS: Current Outpatient Medications on File Prior to  Visit  Medication Sig Dispense Refill  . cyclobenzaprine (FLEXERIL) 10 MG tablet Take 10 mg by mouth 3 (three) times daily.     Marland Kitchen NUVARING 0.12-0.015 MG/24HR vaginal ring Place 1 each vaginally every 28 (twenty-eight) days.   11  . OXcarbazepine ER (OXTELLAR XR) 600 MG TB24 1 tab by mouth three times daily per pt report 60 tablet 0   No current facility-administered medications on file prior to visit.     PAST MEDICAL HISTORY: Past Medical History:  Diagnosis Date  . Anxiety   . Brachial neuritis   . Cataract    Post traumatic- per patient  . Cervical radiculopathy   . Cervical vertigo   . Degeneration of cervical intervertebral disc   . Depression   . Dizziness   . Headache   . Joint pain   . Muscle spasm   . Myalgia   . Myofascial pain   . Myositis   . Neuralgia   . Neuritis   . Occipital headache   . Pain management   . Postpartum care following cesarean delivery (07/31/12) 08/01/2012  . S/P primary low transverse C-section (07/31/12) 08/01/2012  . Swallowing difficulty   . Weakness of both hands   . Weakness of both legs     PAST SURGICAL HISTORY: Past Surgical History:  Procedure Laterality Date  . CERVICAL FUSION  04/2010   C3-C6  . CESAREAN SECTION N/A 07/31/2012   Procedure: CESAREAN SECTION;  Surgeon: Lovenia Kim, MD;  Location: [facility];  Service: Obstetrics;  Laterality: N/A;   EDD: 08/18/12  . ECTOPIC PREGNANCY SURGERY  04/2005    SOCIAL HISTORY: Social History   Tobacco Use  . Smoking status: Never Smoker  . Smokeless tobacco: Never Used  Substance Use Topics  . Alcohol use: No    Alcohol/week: 0.0 standard drinks  . Drug use: No    FAMILY HISTORY: History reviewed. No pertinent family history.  ROS: Review of Systems  Constitutional: Negative for weight loss.  Gastrointestinal: Negative for nausea and vomiting.  Musculoskeletal:       Negative for muscle weakness  Endo/Heme/Allergies:       + polyphagia    PHYSICAL EXAM: Blood pressure  128/84, pulse 88, temperature 98 F (36.7 C), temperature source Oral, height 5\' 5"  (1.651 m), weight 206 lb (93.4 kg), SpO2 98 %. Body mass index is 34.28 kg/m. Physical Exam Vitals signs reviewed.  Constitutional:      Appearance: Normal appearance. She is well-developed. She is obese.  Cardiovascular:     Rate and Rhythm: Normal rate.  Pulmonary:     Effort: Pulmonary effort is normal.  Musculoskeletal: Normal range of motion.  Skin:    General: Skin is warm and dry.  Neurological:     Mental Status: She is alert and oriented to person, place, and time.  Psychiatric:        Mood and Affect: Mood normal.        Behavior: Behavior normal.     RECENT LABS AND TESTS: BMET    Component Value Date/Time   NA 143 02/06/2018 1027   K 4.0 02/06/2018 1027   CL 104 02/06/2018 1027   CO2 23 02/06/2018 1027  GLUCOSE 83 02/06/2018 1027   GLUCOSE 86 07/09/2017 1936   BUN 14 02/06/2018 1027   CREATININE 1.01 (H) 02/06/2018 1027   CREATININE 0.86 08/21/2014 1531   CALCIUM 9.4 02/06/2018 1027   GFRNONAA 71 02/06/2018 1027   GFRAA 82 02/06/2018 1027   Lab Results  Component Value Date   HGBA1C 5.7 (H) 02/06/2018   Lab Results  Component Value Date   INSULIN 15.6 02/06/2018   CBC    Component Value Date/Time   WBC 6.1 02/06/2018 1027   WBC 8.1 07/09/2017 1936   RBC 4.94 02/06/2018 1027   RBC 4.78 07/09/2017 1936   HGB 12.9 02/06/2018 1027   HCT 39.6 02/06/2018 1027   PLT 297 07/09/2017 1936   MCV 80 02/06/2018 1027   MCH 26.1 (L) 02/06/2018 1027   MCH 26.6 07/09/2017 1936   MCHC 32.6 02/06/2018 1027   MCHC 31.8 07/09/2017 1936   RDW 13.3 02/06/2018 1027   LYMPHSABS 2.3 02/06/2018 1027   MONOABS 0.4 09/30/2015 1118   EOSABS 0.1 02/06/2018 1027   BASOSABS 0.0 02/06/2018 1027   Iron/TIBC/Ferritin/ %Sat No results found for: IRON, TIBC, FERRITIN, IRONPCTSAT Lipid Panel     Component Value Date/Time   CHOL 210 (H) 02/06/2018 1027   TRIG 82 02/06/2018 1027   HDL  61 02/06/2018 1027   CHOLHDL 4 10/29/2014 0905   VLDL 21.0 10/29/2014 0905   LDLCALC 133 (H) 02/06/2018 1027   Hepatic Function Panel     Component Value Date/Time   PROT 7.3 02/06/2018 1027   ALBUMIN 4.1 02/06/2018 1027   AST 8 02/06/2018 1027   ALT 14 02/06/2018 1027   ALKPHOS 132 (H) 02/06/2018 1027   BILITOT 0.3 02/06/2018 1027   BILIDIR 0.1 09/30/2015 1118      Component Value Date/Time   TSH 1.120 02/06/2018 1027   TSH 1.86 10/29/2014 0905    Ref. Range 02/06/2018 10:27  Vitamin D, 25-Hydroxy Latest Ref Range: 30.0 - 100.0 ng/mL 36.8     OBESITY BEHAVIORAL INTERVENTION VISIT  Today's visit was # 5   Starting weight: 207 lbs Starting date: 02/06/2018 Today's weight : 206 lbs Today's date: 04/08/2018 Total lbs lost to date: 1   ASK: We discussed the diagnosis of obesity with Victoria Wagner today and Victoria Wagner agreed to give [place] permission to discuss obesity behavioral modification therapy today.  ASSESS: Ece has the diagnosis of obesity and her BMI today is 34.28 Audrianna is in the action stage of change   ADVISE: Victoria Wagner was educated on the multiple health risks of obesity as well as the benefit of weight loss to improve her health. She was advised of the need for long term treatment and the importance of lifestyle modifications to improve her current health and to decrease her risk of future health problems.  AGREE: Multiple dietary modification options and treatment options were discussed and  Victoria Wagner agreed to follow the recommendations documented in the above note.  ARRANGE: Victoria Wagner was educated on the importance of frequent visits to treat obesity as outlined per CMS and USPSTF guidelines and agreed to schedule her next follow up appointment today.  Corey Skains, am acting as Location manager for [employer]. Viann Fish  I have reviewed the above documentation for accuracy and completeness, and I agree with the above. -Jearld Lesch, DO

## 2018-04-25 ENCOUNTER — Ambulatory Visit (INDEPENDENT_AMBULATORY_CARE_PROVIDER_SITE_OTHER): Payer: Commercial Managed Care - PPO | Admitting: Bariatrics

## 2018-04-25 ENCOUNTER — Encounter (INDEPENDENT_AMBULATORY_CARE_PROVIDER_SITE_OTHER): Payer: Self-pay | Admitting: Bariatrics

## 2018-04-25 VITALS — BP 129/84 | HR 102 | Temp 98.4°F | Ht 65.0 in | Wt 206.0 lb

## 2018-04-25 DIAGNOSIS — E559 Vitamin D deficiency, unspecified: Secondary | ICD-10-CM

## 2018-04-25 DIAGNOSIS — E669 Obesity, unspecified: Secondary | ICD-10-CM

## 2018-04-25 DIAGNOSIS — R7303 Prediabetes: Secondary | ICD-10-CM

## 2018-04-25 DIAGNOSIS — Z9189 Other specified personal risk factors, not elsewhere classified: Secondary | ICD-10-CM

## 2018-04-25 DIAGNOSIS — Z6834 Body mass index (BMI) 34.0-34.9, adult: Secondary | ICD-10-CM

## 2018-04-25 MED ORDER — METFORMIN HCL 500 MG PO TABS: 500.0000 mg | ORAL_TABLET | Freq: Two times a day (BID) | ORAL | 0 refills | Status: DC

## 2018-04-25 NOTE — Progress Notes (Signed)
Office: [phone]  /  Fax: [phone]   HPI:   Chief Complaint: OBESITY Victoria Wagner is here to discuss her progress with her obesity treatment plan. She is on the Category 2 plan + 100 calories and is following her eating plan approximately 50 % of the time. She states she is exercising 0 minutes 0 times per week. Victoria Wagner is eating enough protein, but she is not getting enough water. She has had more cravings. Her weight is 206 lb (93.4 kg) today and she has maintained weight over a period of 2 weeks since her last visit. She has lost 1 lb since starting treatment with [place].  Pre-Diabetes Victoria Wagner has a diagnosis of prediabetes based on her elevated Hgb A1c and was informed this puts her at greater risk of developing diabetes. Her last insulin level was at 15.6 and last A1c was at 5.7. She is taking metformin currently and continues to work on diet and exercise to decrease risk of diabetes. She denies nausea or hypoglycemia.  At risk for diabetes Victoria Wagner is at higher than average risk for developing diabetes due to her obesity and prediabetes. She currently denies polyuria or polydipsia.  Vitamin D deficiency Victoria Wagner has a diagnosis of vitamin D deficiency. She is currently taking high dose vit D and denies nausea, vomiting or muscle weakness.  ASSESSMENT AND PLAN:  Prediabetes - Plan: metFORMIN (GLUCOPHAGE) 500 MG tablet  Vitamin D deficiency  At risk for diabetes mellitus  Class 1 obesity with serious comorbidity and body mass index (BMI) of 34.0 to 34.9 in adult, unspecified obesity type  PLAN:  Pre-Diabetes Victoria Wagner will continue to work on weight loss, exercise, and decreasing simple carbohydrates in her diet to help decrease the risk of diabetes. We dicussed metformin including benefits and risks. She was informed that eating too many simple carbohydrates or too many calories at one sitting increases the likelihood of GI side effects. Genesis agreed to continue metformin 500 mg  BID #60 with no refills and follow up with [place] as directed to monitor her progress.  Diabetes risk counseling Victoria Wagner was given extended (15 minutes) diabetes prevention counseling today. She is 38 y.o. female and has risk factors for diabetes including obesity and prediabetes. We discussed intensive lifestyle modifications today with an emphasis on weight loss as well as increasing exercise and decreasing simple carbohydrates in her diet.  Vitamin D Deficiency Victoria Wagner was informed that low vitamin D levels contributes to fatigue and are associated with obesity, breast, and colon cancer. She agrees to continue to take prescription Vit D @50 ,000 IU every week and will follow up for routine testing of vitamin D, at least 2-3 times per year. She was informed of the risk of over-replacement of vitamin D and agrees to not increase her dose unless she discusses this with [place] first.  Obesity Victoria Wagner is currently in the action stage of change. As such, her goal is to continue with weight loss efforts She has agreed to follow a lower carbohydrate, vegetable and lean protein rich diet plan Victoria Wagner has been instructed to work up to a goal of 150 minutes of combined cardio and strengthening exercise per week for weight loss and overall health benefits. We discussed the following Behavioral Modification Strategies today: increase H2O intake, increasing lean protein intake, decreasing simple carbohydrates, increasing vegetables and work on meal planning and easy cooking plans Victoria Wagner will completely eliminate sweets and sugar for 21 days (Monday).  Victoria Wagner has agreed to follow up with our clinic in 3  weeks. She was informed of the importance of frequent follow up visits to maximize her success with intensive lifestyle modifications for her multiple health conditions.  ALLERGIES: No Known Allergies  MEDICATIONS: Current Outpatient Medications on File Prior to Visit  Medication Sig Dispense Refill  .  cyclobenzaprine (FLEXERIL) 10 MG tablet Take 10 mg by mouth 3 (three) times daily.     Marland Kitchen NUVARING 0.12-0.015 MG/24HR vaginal ring Place 1 each vaginally every 28 (twenty-eight) days.   11  . OXcarbazepine ER (OXTELLAR XR) 600 MG TB24 1 tab by mouth three times daily per pt report 60 tablet 0  . Vitamin D, Ergocalciferol, (DRISDOL) 1.25 MG (50000 UT) CAPS capsule Take 1 capsule (50,000 Units total) by mouth every 7 (seven) days. 4 capsule 0   No current facility-administered medications on file prior to visit.     PAST MEDICAL HISTORY: Past Medical History:  Diagnosis Date  . Anxiety   . Brachial neuritis   . Cataract    Post traumatic- per patient  . Cervical radiculopathy   . Cervical vertigo   . Degeneration of cervical intervertebral disc   . Depression   . Dizziness   . Headache   . Joint pain   . Muscle spasm   . Myalgia   . Myofascial pain   . Myositis   . Neuralgia   . Neuritis   . Occipital headache   . Pain management   . Postpartum care following cesarean delivery (07/31/12) 08/01/2012  . S/P primary low transverse C-section (07/31/12) 08/01/2012  . Swallowing difficulty   . Weakness of both hands   . Weakness of both legs     PAST SURGICAL HISTORY: Past Surgical History:  Procedure Laterality Date  . CERVICAL FUSION  04/2010   C3-C6  . CESAREAN SECTION N/A 07/31/2012   Procedure: CESAREAN SECTION;  Surgeon: Lovenia Kim, MD;  Location: [facility];  Service: Obstetrics;  Laterality: N/A;   EDD: 08/18/12  . ECTOPIC PREGNANCY SURGERY  04/2005    SOCIAL HISTORY: Social History   Tobacco Use  . Smoking status: Never Smoker  . Smokeless tobacco: Never Used  Substance Use Topics  . Alcohol use: No    Alcohol/week: 0.0 standard drinks  . Drug use: No    FAMILY HISTORY: History reviewed. No pertinent family history.  ROS: Review of Systems  Constitutional: Negative for weight loss.  Gastrointestinal: Negative for nausea and vomiting.  Genitourinary: Negative  for frequency.  Musculoskeletal:       Negative for muscle weakness  Endo/Heme/Allergies: Negative for polydipsia.       Negative for hypoglycemia    PHYSICAL EXAM: Blood pressure 129/84, pulse (!) 102, temperature 98.4 F (36.9 C), temperature source Oral, height 5\' 5"  (1.651 m), weight 206 lb (93.4 kg), SpO2 100 %. Body mass index is 34.28 kg/m. Physical Exam Vitals signs reviewed.  Constitutional:      Appearance: Normal appearance. She is well-developed. She is obese.  Cardiovascular:     Rate and Rhythm: Normal rate.  Pulmonary:     Effort: Pulmonary effort is normal.  Musculoskeletal: Normal range of motion.  Skin:    General: Skin is warm and dry.  Neurological:     Mental Status: She is alert and oriented to person, place, and time.  Psychiatric:        Mood and Affect: Mood normal.        Behavior: Behavior normal.     RECENT LABS AND TESTS: BMET  Component Value Date/Time   NA 143 02/06/2018 1027   K 4.0 02/06/2018 1027   CL 104 02/06/2018 1027   CO2 23 02/06/2018 1027   GLUCOSE 83 02/06/2018 1027   GLUCOSE 86 07/09/2017 1936   BUN 14 02/06/2018 1027   CREATININE 1.01 (H) 02/06/2018 1027   CREATININE 0.86 08/21/2014 1531   CALCIUM 9.4 02/06/2018 1027   GFRNONAA 71 02/06/2018 1027   GFRAA 82 02/06/2018 1027   Lab Results  Component Value Date   HGBA1C 5.7 (H) 02/06/2018   Lab Results  Component Value Date   INSULIN 15.6 02/06/2018   CBC    Component Value Date/Time   WBC 6.1 02/06/2018 1027   WBC 8.1 07/09/2017 1936   RBC 4.94 02/06/2018 1027   RBC 4.78 07/09/2017 1936   HGB 12.9 02/06/2018 1027   HCT 39.6 02/06/2018 1027   PLT 297 07/09/2017 1936   MCV 80 02/06/2018 1027   MCH 26.1 (L) 02/06/2018 1027   MCH 26.6 07/09/2017 1936   MCHC 32.6 02/06/2018 1027   MCHC 31.8 07/09/2017 1936   RDW 13.3 02/06/2018 1027   LYMPHSABS 2.3 02/06/2018 1027   MONOABS 0.4 09/30/2015 1118   EOSABS 0.1 02/06/2018 1027   BASOSABS 0.0 02/06/2018 1027     Iron/TIBC/Ferritin/ %Sat No results found for: IRON, TIBC, FERRITIN, IRONPCTSAT Lipid Panel     Component Value Date/Time   CHOL 210 (H) 02/06/2018 1027   TRIG 82 02/06/2018 1027   HDL 61 02/06/2018 1027   CHOLHDL 4 10/29/2014 0905   VLDL 21.0 10/29/2014 0905   LDLCALC 133 (H) 02/06/2018 1027   Hepatic Function Panel     Component Value Date/Time   PROT 7.3 02/06/2018 1027   ALBUMIN 4.1 02/06/2018 1027   AST 8 02/06/2018 1027   ALT 14 02/06/2018 1027   ALKPHOS 132 (H) 02/06/2018 1027   BILITOT 0.3 02/06/2018 1027   BILIDIR 0.1 09/30/2015 1118      Component Value Date/Time   TSH 1.120 02/06/2018 1027   TSH 1.86 10/29/2014 0905   Results for LONITA, DEBES (MRN [id number]) as of 04/25/2018 16:52  Ref. Range 02/06/2018 10:27  Vitamin D, 25-Hydroxy Latest Ref Range: 30.0 - 100.0 ng/mL 36.8     OBESITY BEHAVIORAL INTERVENTION VISIT  Today's visit was # 6   Starting weight: 207 lbs Starting date: 02/06/2018 Today's weight : 206 lbs Today's date: 04/25/2018 Total lbs lost to date: 1   ASK: We discussed the diagnosis of obesity with Latrina G Scallon today and Magdeline agreed to give [place] permission to discuss obesity behavioral modification therapy today.  ASSESS: Aubrynn has the diagnosis of obesity and her BMI today is 34.28 Kalie is in the action stage of change   ADVISE: Tyrica was educated on the multiple health risks of obesity as well as the benefit of weight loss to improve her health. She was advised of the need for long term treatment and the importance of lifestyle modifications to improve her current health and to decrease her risk of future health problems.  AGREE: Multiple dietary modification options and treatment options were discussed and  Adaleena agreed to follow the recommendations documented in the above note.  ARRANGE: Sheril was educated on the importance of frequent visits to treat obesity as outlined per CMS and USPSTF guidelines and agreed  to schedule her next follow up appointment today.  Corey Skains, am acting as Location manager for [employer]. Owens Shark, DO  I have reviewed the above documentation for  accuracy and completeness, and I agree with the above. -Jearld Lesch, DO

## 2018-04-30 ENCOUNTER — Encounter (INDEPENDENT_AMBULATORY_CARE_PROVIDER_SITE_OTHER): Payer: Self-pay | Admitting: Bariatrics

## 2018-05-08 DIAGNOSIS — N951 Menopausal and female climacteric states: Secondary | ICD-10-CM | POA: Diagnosis not present

## 2018-05-08 DIAGNOSIS — E289 Ovarian dysfunction, unspecified: Secondary | ICD-10-CM | POA: Diagnosis not present

## 2018-05-08 DIAGNOSIS — E038 Other specified hypothyroidism: Secondary | ICD-10-CM | POA: Diagnosis not present

## 2018-05-14 DIAGNOSIS — G894 Chronic pain syndrome: Secondary | ICD-10-CM | POA: Diagnosis not present

## 2018-05-14 DIAGNOSIS — M25511 Pain in right shoulder: Secondary | ICD-10-CM | POA: Diagnosis not present

## 2018-05-14 DIAGNOSIS — M961 Postlaminectomy syndrome, not elsewhere classified: Secondary | ICD-10-CM | POA: Diagnosis not present

## 2018-05-16 ENCOUNTER — Encounter (INDEPENDENT_AMBULATORY_CARE_PROVIDER_SITE_OTHER): Payer: Self-pay | Admitting: Bariatrics

## 2018-05-16 ENCOUNTER — Ambulatory Visit (INDEPENDENT_AMBULATORY_CARE_PROVIDER_SITE_OTHER): Payer: Commercial Managed Care - PPO | Admitting: Bariatrics

## 2018-05-16 VITALS — BP 119/88 | HR 88 | Temp 98.0°F | Ht 65.0 in | Wt 203.0 lb

## 2018-05-16 DIAGNOSIS — Z9189 Other specified personal risk factors, not elsewhere classified: Secondary | ICD-10-CM | POA: Diagnosis not present

## 2018-05-16 DIAGNOSIS — E559 Vitamin D deficiency, unspecified: Secondary | ICD-10-CM | POA: Diagnosis not present

## 2018-05-16 DIAGNOSIS — R7303 Prediabetes: Secondary | ICD-10-CM

## 2018-05-16 DIAGNOSIS — E669 Obesity, unspecified: Secondary | ICD-10-CM | POA: Diagnosis not present

## 2018-05-16 DIAGNOSIS — Z6833 Body mass index (BMI) 33.0-33.9, adult: Secondary | ICD-10-CM

## 2018-05-20 NOTE — Progress Notes (Signed)
Office: [phone]  /  Fax: [phone]   HPI:   Chief Complaint: OBESITY Shannon is here to discuss her progress with her obesity treatment plan. She is on the lower carbohydrate, vegetable and lean protein rich diet plan and is following her eating plan approximately 90 to 95 % of the time. She states she is exercising 0 minutes 0 times per week. Mykenna remains on a "spiritual" fasting and she has given up all sweets. She is doing well with the protein, Her weight is 203 lb (92.1 kg) today and has had a weight loss of 3 pounds over a period of 3 weeks since her last visit. She has lost 4 lbs since starting treatment with [place].  Pre-Diabetes Lenell has a diagnosis of prediabetes based on her elevated Hgb A1c and was informed this puts her at greater risk of developing diabetes. Her last A1c was at 5.7 and last insulin level was at 15.6 She is taking metformin currently and continues to work on diet and exercise to decrease risk of diabetes. She denies nausea or hypoglycemia.  At risk for diabetes Barbette is at higher than average risk for developing diabetes due to her obesity and prediabetes. She currently denies polyuria or polydipsia.  Vitamin D deficiency Karlita has a diagnosis of vitamin D deficiency. She is currently taking high dose vit D and denies nausea, vomiting or muscle weakness.  ASSESSMENT AND PLAN:  Prediabetes - Plan: Comprehensive metabolic panel, Hemoglobin A1c, Insulin, random  Vitamin D deficiency - Plan: VITAMIN D 25 Hydroxy (Vit-D Deficiency, Fractures)  At risk for diabetes mellitus  Class 1 obesity with serious comorbidity and body mass index (BMI) of 33.0 to 33.9 in adult, unspecified obesity type  PLAN:  Pre-Diabetes Kyeshia will continue to work on weight loss, exercise, and decreasing simple carbohydrates in her diet to help decrease the risk of diabetes. We dicussed metformin including benefits and risks. She was informed that eating too many  simple carbohydrates or too many calories at one sitting increases the likelihood of GI side effects. Anishka will continue metformin and follow up with [place] as directed to monitor her progress.  Diabetes risk counseling Paysley was given extended (15 minutes) diabetes prevention counseling today. She is 38 y.o. female and has risk factors for diabetes including obesity and prediabetes. We discussed intensive lifestyle modifications today with an emphasis on weight loss as well as increasing exercise and decreasing simple carbohydrates in her diet.  Vitamin D Deficiency Trinika was informed that low vitamin D levels contributes to fatigue and are associated with obesity, breast, and colon cancer. She agrees to continue to take prescription Vit D @50 ,000 IU every week and will follow up for routine testing of vitamin D, at least 2-3 times per year. She was informed of the risk of over-replacement of vitamin D and agrees to not increase her dose unless she discusses this with [place] first.  Obesity Mirna is currently in the action stage of change. As such, her goal is to continue with weight loss efforts She has agreed to follow a lower carbohydrate, vegetable and lean protein rich diet plan Aaminah has been instructed to work up to a goal of 150 minutes of combined cardio and strengthening exercise per week for weight loss and overall health benefits. We discussed the following Behavioral Modification Strategies today: increase H2O intake, keeping healthy foods in the home, increasing lean protein intake, decreasing simple carbohydrates, increasing vegetables and work on meal planning and easy cooking plans  [employer]  has agreed to follow up with our clinic in 2 weeks. She was informed of the importance of frequent follow up visits to maximize her success with intensive lifestyle modifications for her multiple health conditions.  ALLERGIES: No Known Allergies  MEDICATIONS: Current Outpatient  Medications on File Prior to Visit  Medication Sig Dispense Refill  . cyclobenzaprine (FLEXERIL) 10 MG tablet Take 10 mg by mouth 3 (three) times daily.     . metFORMIN (GLUCOPHAGE) 500 MG tablet Take 1 tablet (500 mg total) by mouth 2 (two) times daily with a meal. 60 tablet 0  . NUVARING 0.12-0.015 MG/24HR vaginal ring Place 1 each vaginally every 28 (twenty-eight) days.   11  . OXcarbazepine ER (OXTELLAR XR) 600 MG TB24 1 tab by mouth three times daily per pt report 60 tablet 0  . Vitamin D, Ergocalciferol, (DRISDOL) 1.25 MG (50000 UT) CAPS capsule Take 1 capsule (50,000 Units total) by mouth every 7 (seven) days. 4 capsule 0   No current facility-administered medications on file prior to visit.     PAST MEDICAL HISTORY: Past Medical History:  Diagnosis Date  . Anxiety   . Brachial neuritis   . Cataract    Post traumatic- per patient  . Cervical radiculopathy   . Cervical vertigo   . Degeneration of cervical intervertebral disc   . Depression   . Dizziness   . Headache   . Joint pain   . Muscle spasm   . Myalgia   . Myofascial pain   . Myositis   . Neuralgia   . Neuritis   . Occipital headache   . Pain management   . Postpartum care following cesarean delivery (07/31/12) 08/01/2012  . S/P primary low transverse C-section (07/31/12) 08/01/2012  . Swallowing difficulty   . Weakness of both hands   . Weakness of both legs     PAST SURGICAL HISTORY: Past Surgical History:  Procedure Laterality Date  . CERVICAL FUSION  04/2010   C3-C6  . CESAREAN SECTION N/A 07/31/2012   Procedure: CESAREAN SECTION;  Surgeon: Lovenia Kim, MD;  Location: [facility];  Service: Obstetrics;  Laterality: N/A;   EDD: 08/18/12  . ECTOPIC PREGNANCY SURGERY  04/2005    SOCIAL HISTORY: Social History   Tobacco Use  . Smoking status: Never Smoker  . Smokeless tobacco: Never Used  Substance Use Topics  . Alcohol use: No    Alcohol/week: 0.0 standard drinks  . Drug use: No    FAMILY  HISTORY: History reviewed. No pertinent family history.  ROS: Review of Systems  Constitutional: Positive for weight loss.  Gastrointestinal: Negative for nausea and vomiting.  Genitourinary: Negative for frequency.  Musculoskeletal:       Negative for muscle weakness  Endo/Heme/Allergies: Negative for polydipsia.       Negative for hypoglycemia    PHYSICAL EXAM: Blood pressure 119/88, pulse 88, temperature 98 F (36.7 C), temperature source Oral, height 5\' 5"  (1.651 m), weight 203 lb (92.1 kg), SpO2 99 %. Body mass index is 33.78 kg/m. Physical Exam Vitals signs reviewed.  Constitutional:      Appearance: Normal appearance. She is well-developed. She is obese.  Cardiovascular:     Rate and Rhythm: Normal rate.  Pulmonary:     Effort: Pulmonary effort is normal.  Musculoskeletal: Normal range of motion.  Skin:    General: Skin is warm and dry.  Neurological:     Mental Status: She is alert and oriented to person, place, and time.  Psychiatric:  Mood and Affect: Mood normal.        Behavior: Behavior normal.     RECENT LABS AND TESTS: BMET    Component Value Date/Time   NA 143 02/06/2018 1027   K 4.0 02/06/2018 1027   CL 104 02/06/2018 1027   CO2 23 02/06/2018 1027   GLUCOSE 83 02/06/2018 1027   GLUCOSE 86 07/09/2017 1936   BUN 14 02/06/2018 1027   CREATININE 1.01 (H) 02/06/2018 1027   CREATININE 0.86 08/21/2014 1531   CALCIUM 9.4 02/06/2018 1027   GFRNONAA 71 02/06/2018 1027   GFRAA 82 02/06/2018 1027   Lab Results  Component Value Date   HGBA1C 5.7 (H) 02/06/2018   Lab Results  Component Value Date   INSULIN 15.6 02/06/2018   CBC    Component Value Date/Time   WBC 6.1 02/06/2018 1027   WBC 8.1 07/09/2017 1936   RBC 4.94 02/06/2018 1027   RBC 4.78 07/09/2017 1936   HGB 12.9 02/06/2018 1027   HCT 39.6 02/06/2018 1027   PLT 297 07/09/2017 1936   MCV 80 02/06/2018 1027   MCH 26.1 (L) 02/06/2018 1027   MCH 26.6 07/09/2017 1936   MCHC 32.6  02/06/2018 1027   MCHC 31.8 07/09/2017 1936   RDW 13.3 02/06/2018 1027   LYMPHSABS 2.3 02/06/2018 1027   MONOABS 0.4 09/30/2015 1118   EOSABS 0.1 02/06/2018 1027   BASOSABS 0.0 02/06/2018 1027   Iron/TIBC/Ferritin/ %Sat No results found for: IRON, TIBC, FERRITIN, IRONPCTSAT Lipid Panel     Component Value Date/Time   CHOL 210 (H) 02/06/2018 1027   TRIG 82 02/06/2018 1027   HDL 61 02/06/2018 1027   CHOLHDL 4 10/29/2014 0905   VLDL 21.0 10/29/2014 0905   LDLCALC 133 (H) 02/06/2018 1027   Hepatic Function Panel     Component Value Date/Time   PROT 7.3 02/06/2018 1027   ALBUMIN 4.1 02/06/2018 1027   AST 8 02/06/2018 1027   ALT 14 02/06/2018 1027   ALKPHOS 132 (H) 02/06/2018 1027   BILITOT 0.3 02/06/2018 1027   BILIDIR 0.1 09/30/2015 1118      Component Value Date/Time   TSH 1.120 02/06/2018 1027   TSH 1.86 10/29/2014 0905     Ref. Range 02/06/2018 10:27  Vitamin D, 25-Hydroxy Latest Ref Range: 30.0 - 100.0 ng/mL 36.8     OBESITY BEHAVIORAL INTERVENTION VISIT  Today's visit was # 7   Starting weight: 207 lbs Starting date: 02/06/2018 Today's weight : 203 lbs  Today's date: 05/16/2018 Total lbs lost to date: 4   ASK: We discussed the diagnosis of obesity with Willadene G Dina today and Fay agreed to give [place] permission to discuss obesity behavioral modification therapy today.  ASSESS: Mischelle has the diagnosis of obesity and her BMI today is 33.78 Keilly is in the action stage of change   ADVISE: Shyne was educated on the multiple health risks of obesity as well as the benefit of weight loss to improve her health. She was advised of the need for long term treatment and the importance of lifestyle modifications to improve her current health and to decrease her risk of future health problems.  AGREE: Multiple dietary modification options and treatment options were discussed and  Oneta agreed to follow the recommendations documented in the above  note.  ARRANGE: Emaleigh was educated on the importance of frequent visits to treat obesity as outlined per CMS and USPSTF guidelines and agreed to schedule her next follow up appointment today.  Corey Skains,  am acting as Location manager for [employer]. Owens Shark, DO  I have reviewed the above documentation for accuracy and completeness, and I agree with the above. -Jearld Lesch, DO

## 2018-05-29 DIAGNOSIS — L2089 Other atopic dermatitis: Secondary | ICD-10-CM | POA: Diagnosis not present

## 2018-05-30 ENCOUNTER — Encounter (INDEPENDENT_AMBULATORY_CARE_PROVIDER_SITE_OTHER): Payer: Self-pay

## 2018-05-30 ENCOUNTER — Ambulatory Visit (INDEPENDENT_AMBULATORY_CARE_PROVIDER_SITE_OTHER): Payer: Self-pay | Admitting: Family Medicine

## 2018-06-03 ENCOUNTER — Ambulatory Visit (INDEPENDENT_AMBULATORY_CARE_PROVIDER_SITE_OTHER): Payer: Self-pay | Admitting: Bariatrics

## 2018-06-04 ENCOUNTER — Ambulatory Visit (INDEPENDENT_AMBULATORY_CARE_PROVIDER_SITE_OTHER): Payer: Self-pay | Admitting: Bariatrics

## 2018-06-04 ENCOUNTER — Encounter (INDEPENDENT_AMBULATORY_CARE_PROVIDER_SITE_OTHER): Payer: Self-pay

## 2018-06-04 ENCOUNTER — Ambulatory Visit (INDEPENDENT_AMBULATORY_CARE_PROVIDER_SITE_OTHER): Payer: Self-pay | Admitting: Family Medicine

## 2018-06-06 ENCOUNTER — Encounter (INDEPENDENT_AMBULATORY_CARE_PROVIDER_SITE_OTHER): Payer: Self-pay

## 2018-06-06 ENCOUNTER — Ambulatory Visit (INDEPENDENT_AMBULATORY_CARE_PROVIDER_SITE_OTHER): Payer: Self-pay | Admitting: Family Medicine

## 2018-06-10 ENCOUNTER — Ambulatory Visit (INDEPENDENT_AMBULATORY_CARE_PROVIDER_SITE_OTHER): Payer: Commercial Managed Care - PPO | Admitting: Family Medicine

## 2018-06-10 ENCOUNTER — Encounter (INDEPENDENT_AMBULATORY_CARE_PROVIDER_SITE_OTHER): Payer: Self-pay | Admitting: Family Medicine

## 2018-06-10 VITALS — BP 130/88 | HR 94 | Temp 97.5°F | Ht 65.0 in | Wt 204.0 lb

## 2018-06-10 DIAGNOSIS — E2839 Other primary ovarian failure: Secondary | ICD-10-CM | POA: Diagnosis not present

## 2018-06-10 DIAGNOSIS — E2749 Other adrenocortical insufficiency: Secondary | ICD-10-CM | POA: Diagnosis not present

## 2018-06-10 DIAGNOSIS — R7303 Prediabetes: Secondary | ICD-10-CM | POA: Diagnosis not present

## 2018-06-10 DIAGNOSIS — E559 Vitamin D deficiency, unspecified: Secondary | ICD-10-CM | POA: Insufficient documentation

## 2018-06-10 DIAGNOSIS — Z9189 Other specified personal risk factors, not elsewhere classified: Secondary | ICD-10-CM

## 2018-06-10 DIAGNOSIS — E038 Other specified hypothyroidism: Secondary | ICD-10-CM | POA: Diagnosis not present

## 2018-06-10 DIAGNOSIS — E669 Obesity, unspecified: Secondary | ICD-10-CM | POA: Diagnosis not present

## 2018-06-10 DIAGNOSIS — Z6833 Body mass index (BMI) 33.0-33.9, adult: Secondary | ICD-10-CM

## 2018-06-10 MED ORDER — VITAMIN D (ERGOCALCIFEROL) 1.25 MG (50000 UNIT) PO CAPS: 50000.0000 [IU] | ORAL_CAPSULE | ORAL | 0 refills | Status: DC

## 2018-06-10 NOTE — Progress Notes (Signed)
Office: [phone]  /  Fax: [phone]   HPI:   Chief Complaint: OBESITY Victoria Wagner is here to discuss her progress with her obesity treatment plan. She is on the  follow a lower carbohydrate, vegetable and lean protein rich diet plan and is following her eating plan approximately 90 % of the time. She states she is exercising 0 minutes 0 times per week. [name] has been on the low carb diet and wants to stop. She feels it is to hard to adhere to. She has been on prednisone recently which has increased her hunger. Her weight is 204 lb (92.5 kg) today and has gained 1 lbs since her last visit. She has lost 3 lbs since starting treatment with [place].  Vitamin D deficiency [name] has a diagnosis of vitamin D deficiency. She is currently taking prescription Vit D and denies nausea, vomiting or muscle weakness. She is not at goal. Her last Vit D level was 5.7 on 02/06/2018. She reports fatigue.  Pre-Diabetes [name] has a diagnosis of prediabetes based on her elevated Hgb A1c and was informed this puts her at greater risk of developing diabetes. She is taking metformin currently and is tolerating it well. [name] continues to work on diet and exercise to decrease risk of diabetes. She denies nausea or hypoglycemia. She denies polyphagia.  At risk for osteopenia and osteoporosis [name] is at higher risk of osteopenia and osteoporosis due to vitamin D deficiency.    ASSESSMENT AND PLAN:  Vitamin D deficiency - Plan: Vitamin D, Ergocalciferol, (DRISDOL) 1.25 MG (50000 UT) CAPS capsule  Prediabetes  At risk for osteoporosis  Class 1 obesity with serious comorbidity and body mass index (BMI) of 33.0 to 33.9 in adult, unspecified obesity type  PLAN:  Vitamin D Deficiency [name] was informed that low vitamin D levels contributes to fatigue and are associated with obesity, breast, and colon cancer. She agrees to continue to take prescription Vit D 50,000 IU every week #4 with no refills and  will follow up for routine testing of vitamin D, at least 2-3 times per year. She was informed of the risk of over-replacement of vitamin D and agrees to not increase her dose unless she discusses this with [place] first. We will check labs at next visit. Victoria Wagner agrees to follow up with our clinic in 2 weeks.   Pre-Diabetes Victoria Wagner will continue to work on weight loss, exercise, and decreasing simple carbohydrates in her diet to help decrease the risk of diabetes. We dicussed metformin including benefits and risks. She was informed that eating too many simple carbohydrates or too many calories at one sitting increases the likelihood of GI side effects. [name] agrees to continue her meal plan and taking metformin for now, a prescription was not written today. We will check labs at next visit. Victoria Wagner agrees to follow up with our clinic in 2 weeks.  At risk for osteopenia and osteoporosis Victoria Wagner was given extended  (15 minutes) osteoporosis prevention counseling today. Victoria Wagner is at risk for osteopenia and osteoporosis due to her vitamin D deficiency. She was encouraged to take her vitamin D and follow her higher calcium diet and increase strengthening exercise to help strengthen her bones and decrease her risk of osteopenia and osteoporosis.  Obesity Victoria Wagner is currently in the action stage of change. As such, her goal is to continue with weight loss efforts She has agreed to keep a food journal with 1200 calories and 85 grams of  protein daily or follow the Category 2  plan Victoria Wagner has not been prescribed exercise at this time. We discussed the following Behavioral Modification Strategies today: Planning for success and Keep a strict food journal  Victoria Wagner has agreed to follow up with our clinic in 2 weeks. She was informed of the importance of frequent follow up visits to maximize her success with intensive lifestyle modifications for her multiple health conditions.  ALLERGIES: No Known  Allergies  MEDICATIONS: Current Outpatient Medications on File Prior to Visit  Medication Sig Dispense Refill  . cyclobenzaprine (FLEXERIL) 10 MG tablet Take 10 mg by mouth 3 (three) times daily.     . metFORMIN (GLUCOPHAGE) 500 MG tablet Take 1 tablet (500 mg total) by mouth 2 (two) times daily with a meal. 60 tablet 0  . NUVARING 0.12-0.015 MG/24HR vaginal ring Place 1 each vaginally every 28 (twenty-eight) days.   11  . OXcarbazepine ER (OXTELLAR XR) 600 MG TB24 1 tab by mouth three times daily per pt report 60 tablet 0   No current facility-administered medications on file prior to visit.     PAST MEDICAL HISTORY: Past Medical History:  Diagnosis Date  . Anxiety   . Brachial neuritis   . Cataract    Post traumatic- per patient  . Cervical radiculopathy   . Cervical vertigo   . Degeneration of cervical intervertebral disc   . Depression   . Dizziness   . Headache   . Joint pain   . Muscle spasm   . Myalgia   . Myofascial pain   . Myositis   . Neuralgia   . Neuritis   . Occipital headache   . Pain management   . Postpartum care following cesarean delivery (07/31/12) 08/01/2012  . S/P primary low transverse C-section (07/31/12) 08/01/2012  . Swallowing difficulty   . Weakness of both hands   . Weakness of both legs     PAST SURGICAL HISTORY: Past Surgical History:  Procedure Laterality Date  . CERVICAL FUSION  04/2010   C3-C6  . CESAREAN SECTION N/A 07/31/2012   Procedure: CESAREAN SECTION;  Surgeon: Lovenia Kim, MD;  Location: [facility];  Service: Obstetrics;  Laterality: N/A;   EDD: 08/18/12  . ECTOPIC PREGNANCY SURGERY  04/2005    SOCIAL HISTORY: Social History   Tobacco Use  . Smoking status: Never Smoker  . Smokeless tobacco: Never Used  Substance Use Topics  . Alcohol use: No    Alcohol/week: 0.0 standard drinks  . Drug use: No    FAMILY HISTORY: History reviewed. No pertinent family history.  ROS: Review of Systems  Constitutional: Positive for  malaise/fatigue. Negative for weight loss.  Gastrointestinal: Negative for nausea and vomiting.  Genitourinary:       Negative for polyuria  Musculoskeletal:       Negative for muscle weakness  Endo/Heme/Allergies:       Negative for hypoglycemia Negative for polyphagia    PHYSICAL EXAM: Blood pressure 130/88, pulse 94, temperature (!) 97.5 F (36.4 C), temperature source Oral, height 5\' 5"  (1.651 m), weight 204 lb (92.5 kg), SpO2 98 %. Body mass index is 33.95 kg/m. Physical Exam Vitals signs reviewed.  Constitutional:      Appearance: Normal appearance. She is obese.  Cardiovascular:     Rate and Rhythm: Normal rate.     Pulses: Normal pulses.  Pulmonary:     Effort: Pulmonary effort is normal.  Musculoskeletal: Normal range of motion.  Skin:    General: Skin is warm and dry.  Neurological:  Mental Status: She is alert and oriented to person, place, and time.  Psychiatric:        Mood and Affect: Mood normal.        Behavior: Behavior normal.     RECENT LABS AND TESTS: BMET    Component Value Date/Time   NA 143 02/06/2018 1027   K 4.0 02/06/2018 1027   CL 104 02/06/2018 1027   CO2 23 02/06/2018 1027   GLUCOSE 83 02/06/2018 1027   GLUCOSE 86 07/09/2017 1936   BUN 14 02/06/2018 1027   CREATININE 1.01 (H) 02/06/2018 1027   CREATININE 0.86 08/21/2014 1531   CALCIUM 9.4 02/06/2018 1027   GFRNONAA 71 02/06/2018 1027   GFRAA 82 02/06/2018 1027   Lab Results  Component Value Date   HGBA1C 5.7 (H) 02/06/2018   Lab Results  Component Value Date   INSULIN 15.6 02/06/2018   CBC    Component Value Date/Time   WBC 6.1 02/06/2018 1027   WBC 8.1 07/09/2017 1936   RBC 4.94 02/06/2018 1027   RBC 4.78 07/09/2017 1936   HGB 12.9 02/06/2018 1027   HCT 39.6 02/06/2018 1027   PLT 297 07/09/2017 1936   MCV 80 02/06/2018 1027   MCH 26.1 (L) 02/06/2018 1027   MCH 26.6 07/09/2017 1936   MCHC 32.6 02/06/2018 1027   MCHC 31.8 07/09/2017 1936   RDW 13.3 02/06/2018  1027   LYMPHSABS 2.3 02/06/2018 1027   MONOABS 0.4 09/30/2015 1118   EOSABS 0.1 02/06/2018 1027   BASOSABS 0.0 02/06/2018 1027   Iron/TIBC/Ferritin/ %Sat No results found for: IRON, TIBC, FERRITIN, IRONPCTSAT Lipid Panel     Component Value Date/Time   CHOL 210 (H) 02/06/2018 1027   TRIG 82 02/06/2018 1027   HDL 61 02/06/2018 1027   CHOLHDL 4 10/29/2014 0905   VLDL 21.0 10/29/2014 0905   LDLCALC 133 (H) 02/06/2018 1027   Hepatic Function Panel     Component Value Date/Time   PROT 7.3 02/06/2018 1027   ALBUMIN 4.1 02/06/2018 1027   AST 8 02/06/2018 1027   ALT 14 02/06/2018 1027   ALKPHOS 132 (H) 02/06/2018 1027   BILITOT 0.3 02/06/2018 1027   BILIDIR 0.1 09/30/2015 1118      Component Value Date/Time   TSH 1.120 02/06/2018 1027   TSH 1.86 10/29/2014 0905    Ref. Range 02/06/2018 10:27  Vitamin D, 25-Hydroxy Latest Ref Range: 30.0 - 100.0 ng/mL 36.8      OBESITY BEHAVIORAL INTERVENTION VISIT  Today's visit was # 9   Starting weight: 207 lbs Starting date: 02/06/2018 Today's weight :: 204 lbs Today's date: 06/10/2018 Total lbs lost to date: 3   ASK: We discussed the diagnosis of obesity with Lizette G Wittner today and Qamar agreed to give [place] permission to discuss obesity behavioral modification therapy today.  ASSESS: Brentlee has the diagnosis of obesity and her BMI today is 33.95 Stehanie is in the action stage of change   ADVISE: Caya was educated on the multiple health risks of obesity as well as the benefit of weight loss to improve her health. She was advised of the need for long term treatment and the importance of lifestyle modifications to improve her current health and to decrease her risk of future health problems.  AGREE: Multiple dietary modification options and treatment options were discussed and  Clella agreed to follow the recommendations documented in the above note.  ARRANGE: Gitel was educated on the importance of frequent visits to  treat obesity as  outlined per CMS and USPSTF guidelines and agreed to schedule her next follow up appointment today.  I,Tammy Wysor, am acting as Location manager for [employer], FNP-C.  I have reviewed the above documentation for accuracy and completeness, and I agree with the above.  - Mellany Dinsmore, FNP-C.

## 2018-06-14 DIAGNOSIS — M50223 Other cervical disc displacement at C6-C7 level: Secondary | ICD-10-CM | POA: Diagnosis not present

## 2018-06-14 DIAGNOSIS — Z981 Arthrodesis status: Secondary | ICD-10-CM | POA: Diagnosis not present

## 2018-06-19 DIAGNOSIS — M542 Cervicalgia: Secondary | ICD-10-CM | POA: Diagnosis not present

## 2018-06-19 DIAGNOSIS — M5413 Radiculopathy, cervicothoracic region: Secondary | ICD-10-CM | POA: Diagnosis not present

## 2018-06-19 DIAGNOSIS — R1310 Dysphagia, unspecified: Secondary | ICD-10-CM | POA: Diagnosis not present

## 2018-06-20 DIAGNOSIS — M4802 Spinal stenosis, cervical region: Secondary | ICD-10-CM | POA: Diagnosis not present

## 2018-06-21 ENCOUNTER — Other Ambulatory Visit: Payer: Self-pay | Admitting: Physician Assistant

## 2018-06-21 DIAGNOSIS — M4802 Spinal stenosis, cervical region: Secondary | ICD-10-CM

## 2018-06-25 ENCOUNTER — Ambulatory Visit (INDEPENDENT_AMBULATORY_CARE_PROVIDER_SITE_OTHER): Payer: Self-pay | Admitting: Family Medicine

## 2018-06-25 ENCOUNTER — Encounter (INDEPENDENT_AMBULATORY_CARE_PROVIDER_SITE_OTHER): Payer: Self-pay | Admitting: Bariatrics

## 2018-06-26 ENCOUNTER — Ambulatory Visit (INDEPENDENT_AMBULATORY_CARE_PROVIDER_SITE_OTHER): Payer: Self-pay | Admitting: Family Medicine

## 2018-06-26 ENCOUNTER — Encounter (INDEPENDENT_AMBULATORY_CARE_PROVIDER_SITE_OTHER): Payer: Self-pay

## 2018-06-27 DIAGNOSIS — R1310 Dysphagia, unspecified: Secondary | ICD-10-CM | POA: Diagnosis not present

## 2018-06-27 DIAGNOSIS — K224 Dyskinesia of esophagus: Secondary | ICD-10-CM | POA: Diagnosis not present

## 2018-06-29 ENCOUNTER — Ambulatory Visit
Admission: RE | Admit: 2018-06-29 | Discharge: 2018-06-29 | Disposition: A | Discharge status: Home / Self Care | Payer: Commercial Managed Care - PPO | Source: Ambulatory Visit | Attending: Physician Assistant | Admitting: Physician Assistant

## 2018-06-29 DIAGNOSIS — M4802 Spinal stenosis, cervical region: Secondary | ICD-10-CM

## 2018-07-01 ENCOUNTER — Other Ambulatory Visit: Payer: Commercial Managed Care - PPO

## 2018-07-11 DIAGNOSIS — M4802 Spinal stenosis, cervical region: Secondary | ICD-10-CM | POA: Diagnosis not present

## 2018-07-11 DIAGNOSIS — G894 Chronic pain syndrome: Secondary | ICD-10-CM | POA: Diagnosis not present

## 2018-07-11 DIAGNOSIS — M5032 Other cervical disc degeneration, mid-cervical region, unspecified level: Secondary | ICD-10-CM | POA: Diagnosis not present

## 2018-07-18 ENCOUNTER — Encounter (INDEPENDENT_AMBULATORY_CARE_PROVIDER_SITE_OTHER): Payer: Self-pay

## 2018-08-27 DIAGNOSIS — M961 Postlaminectomy syndrome, not elsewhere classified: Secondary | ICD-10-CM | POA: Diagnosis not present

## 2018-08-27 DIAGNOSIS — M5413 Radiculopathy, cervicothoracic region: Secondary | ICD-10-CM | POA: Diagnosis not present

## 2018-08-27 DIAGNOSIS — M5032 Other cervical disc degeneration, mid-cervical region, unspecified level: Secondary | ICD-10-CM | POA: Diagnosis not present

## 2019-01-15 ENCOUNTER — Other Ambulatory Visit: Payer: Self-pay | Admitting: Primary Care

## 2019-01-15 DIAGNOSIS — E669 Obesity, unspecified: Secondary | ICD-10-CM

## 2019-01-15 DIAGNOSIS — Z0000 Encounter for general adult medical examination without abnormal findings: Secondary | ICD-10-CM

## 2019-01-15 DIAGNOSIS — R7303 Prediabetes: Secondary | ICD-10-CM

## 2019-01-15 DIAGNOSIS — E559 Vitamin D deficiency, unspecified: Secondary | ICD-10-CM

## 2019-01-24 ENCOUNTER — Other Ambulatory Visit: Payer: Self-pay

## 2019-01-24 ENCOUNTER — Other Ambulatory Visit (INDEPENDENT_AMBULATORY_CARE_PROVIDER_SITE_OTHER): Payer: Commercial Managed Care - PPO

## 2019-01-24 DIAGNOSIS — R7303 Prediabetes: Secondary | ICD-10-CM

## 2019-01-24 DIAGNOSIS — E559 Vitamin D deficiency, unspecified: Secondary | ICD-10-CM

## 2019-01-24 DIAGNOSIS — E669 Obesity, unspecified: Secondary | ICD-10-CM

## 2019-01-24 DIAGNOSIS — Z0000 Encounter for general adult medical examination without abnormal findings: Secondary | ICD-10-CM

## 2019-01-25 LAB — CBC
HCT: 38.3 % (ref 35.0–45.0)
Hemoglobin: 12.6 g/dL (ref 11.7–15.5)
MCH: 26.7 pg — ABNORMAL LOW (ref 27.0–33.0)
MCHC: 32.9 g/dL (ref 32.0–36.0)
MCV: 81.1 fL (ref 80.0–100.0)
MPV: 10.2 fL (ref 7.5–12.5)
Platelets: 335 10*3/uL (ref 140–400)
RBC: 4.72 10*6/uL (ref 3.80–5.10)
RDW: 13.6 % (ref 11.0–15.0)
WBC: 9.6 10*3/uL (ref 3.8–10.8)

## 2019-01-25 LAB — COMPREHENSIVE METABOLIC PANEL
AG Ratio: 1.3 (calc) (ref 1.0–2.5)
ALT: 23 U/L (ref 6–29)
AST: 13 U/L (ref 10–30)
Albumin: 3.9 g/dL (ref 3.6–5.1)
Alkaline phosphatase (APISO): 109 U/L (ref 31–125)
BUN: 13 mg/dL (ref 7–25)
CO2: 27 mmol/L (ref 20–32)
Calcium: 9.3 mg/dL (ref 8.6–10.2)
Chloride: 105 mmol/L (ref 98–110)
Creat: 0.93 mg/dL (ref 0.50–1.10)
Globulin: 3.1 g/dL (calc) (ref 1.9–3.7)
Glucose, Bld: 103 mg/dL — ABNORMAL HIGH (ref 65–99)
Potassium: 3.9 mmol/L (ref 3.5–5.3)
Sodium: 141 mmol/L (ref 135–146)
Total Bilirubin: 0.4 mg/dL (ref 0.2–1.2)
Total Protein: 7 g/dL (ref 6.1–8.1)

## 2019-01-25 LAB — HEMOGLOBIN A1C
Hgb A1c MFr Bld: 5.8 % of total Hgb — ABNORMAL HIGH (ref <5.7)
Mean Plasma Glucose: 120 (calc)
eAG (mmol/L): 6.6 (calc)

## 2019-01-25 LAB — VITAMIN D 25 HYDROXY (VIT D DEFICIENCY, FRACTURES): Vit D, 25-Hydroxy: 43 ng/mL (ref 30–100)

## 2019-01-25 LAB — LIPID PANEL
Cholesterol: 210 mg/dL — ABNORMAL HIGH (ref <200)
HDL: 56 mg/dL (ref >=50)
LDL Cholesterol (Calc): 131 mg/dL (calc) — ABNORMAL HIGH
Non-HDL Cholesterol (Calc): 154 mg/dL (calc) — ABNORMAL HIGH (ref <130)
Total CHOL/HDL Ratio: 3.8 (calc) (ref <5.0)
Triglycerides: 123 mg/dL (ref <150)

## 2019-01-31 ENCOUNTER — Encounter: Payer: Self-pay | Admitting: Primary Care

## 2019-01-31 ENCOUNTER — Other Ambulatory Visit: Payer: Self-pay

## 2019-01-31 ENCOUNTER — Ambulatory Visit (INDEPENDENT_AMBULATORY_CARE_PROVIDER_SITE_OTHER): Payer: Commercial Managed Care - PPO | Admitting: Primary Care

## 2019-01-31 VITALS — BP 126/86 | HR 91 | Temp 97.8°F | Ht 65.0 in | Wt 216.5 lb

## 2019-01-31 DIAGNOSIS — Z0001 Encounter for general adult medical examination with abnormal findings: Secondary | ICD-10-CM | POA: Diagnosis not present

## 2019-01-31 DIAGNOSIS — E559 Vitamin D deficiency, unspecified: Secondary | ICD-10-CM

## 2019-01-31 DIAGNOSIS — R7303 Prediabetes: Secondary | ICD-10-CM

## 2019-01-31 DIAGNOSIS — R21 Rash and other nonspecific skin eruption: Secondary | ICD-10-CM | POA: Diagnosis not present

## 2019-01-31 DIAGNOSIS — G8929 Other chronic pain: Secondary | ICD-10-CM | POA: Diagnosis not present

## 2019-01-31 DIAGNOSIS — Z23 Encounter for immunization: Secondary | ICD-10-CM

## 2019-01-31 HISTORY — DX: Rash and other nonspecific skin eruption: R21

## 2019-01-31 MED ORDER — CICLOPIROX OLAMINE 0.77 % EX CREA: TOPICAL_CREAM | Freq: Two times a day (BID) | CUTANEOUS | 0 refills | Status: DC

## 2019-01-31 NOTE — Patient Instructions (Addendum)
Start the ciclopirox cream and apply to the rash twice daily for about one week.  Start exercising. You should be getting 150 minutes of moderate intensity exercise weekly.  It's important to improve your diet by reducing consumption of fast food, fried food, processed snack foods, sugary drinks. Increase consumption of fresh vegetables and fruits, whole grains, water.  Ensure you are drinking 64 ounces of water daily.   It was a pleasure to see you today!   Preventive Care 6-38 Years Old, Female Preventive care refers to visits with your health care provider and lifestyle choices that can promote health and wellness. This includes:  A yearly physical exam. This may also be called an annual well check.  Regular dental visits and eye exams.  Immunizations.  Screening for certain conditions.  Healthy lifestyle choices, such as eating a healthy diet, getting regular exercise, not using drugs or products that contain nicotine and tobacco, and limiting alcohol use. What can I expect for my preventive care visit? Physical exam Your health care provider will check your:  Height and weight. This may be used to calculate body mass index (BMI), which tells if you are at a healthy weight.  Heart rate and blood pressure.  Skin for abnormal spots. Counseling Your health care provider may ask you questions about your:  Alcohol, tobacco, and drug use.  Emotional well-being.  Home and relationship well-being.  Sexual activity.  Eating habits.  Work and work Statistician.  Method of birth control.  Menstrual cycle.  Pregnancy history. What immunizations do I need?  Influenza (flu) vaccine  This is recommended every year. Tetanus, diphtheria, and pertussis (Tdap) vaccine  You may need a Td booster every 10 years. Varicella (chickenpox) vaccine  You may need this if you have not been vaccinated. Human papillomavirus (HPV) vaccine  If recommended by your health care  provider, you may need three doses over 6 months. Measles, mumps, and rubella (MMR) vaccine  You may need at least one dose of MMR. You may also need a second dose. Meningococcal conjugate (MenACWY) vaccine  One dose is recommended if you are age 73-21 years and a first-year college student living in a residence hall, or if you have one of several medical conditions. You may also need additional booster doses. Pneumococcal conjugate (PCV13) vaccine  You may need this if you have certain conditions and were not previously vaccinated. Pneumococcal polysaccharide (PPSV23) vaccine  You may need one or two doses if you smoke cigarettes or if you have certain conditions. Hepatitis A vaccine  You may need this if you have certain conditions or if you travel or work in places where you may be exposed to hepatitis A. Hepatitis B vaccine  You may need this if you have certain conditions or if you travel or work in places where you may be exposed to hepatitis B. Haemophilus influenzae type b (Hib) vaccine  You may need this if you have certain conditions. You may receive vaccines as individual doses or as more than one vaccine together in one shot (combination vaccines). Talk with your health care provider about the risks and benefits of combination vaccines. What tests do I need?  Blood tests  Lipid and cholesterol levels. These may be checked every 5 years starting at age 69.  Hepatitis C test.  Hepatitis B test. Screening  Diabetes screening. This is done by checking your blood sugar (glucose) after you have not eaten for a while (fasting).  Sexually transmitted disease (STD) testing.  BRCA-related cancer screening. This may be done if you have a family history of breast, ovarian, tubal, or peritoneal cancers.  Pelvic exam and Pap test. This may be done every 3 years starting at age 57. Starting at age 18, this may be done every 5 years if you have a Pap test in combination with an  HPV test. Talk with your health care provider about your test results, treatment options, and if necessary, the need for more tests. Follow these instructions at home: Eating and drinking   Eat a diet that includes fresh fruits and vegetables, whole grains, lean protein, and low-fat dairy.  Take vitamin and mineral supplements as recommended by your health care provider.  Do not drink alcohol if: ? Your health care provider tells you not to drink. ? You are pregnant, may be pregnant, or are planning to become pregnant.  If you drink alcohol: ? Limit how much you have to 0-1 drink a day. ? Be aware of how much alcohol is in your drink. In the [place] one drink equals one 12 oz bottle of beer (355 mL), one 5 oz glass of wine (148 mL), or one 1 oz glass of hard liquor (44 mL). Lifestyle  Take daily care of your teeth and gums.  Stay active. Exercise for at least 30 minutes on 5 or more days each week.  Do not use any products that contain nicotine or tobacco, such as cigarettes, e-cigarettes, and chewing tobacco. If you need help quitting, ask your health care provider.  If you are sexually active, practice safe sex. Use a condom or other form of birth control (contraception) in order to prevent pregnancy and STIs (sexually transmitted infections). If you plan to become pregnant, see your health care provider for a preconception visit. What's next?  Visit your health care provider once a year for a well check visit.  Ask your health care provider how often you should have your eyes and teeth checked.  Stay up to date on all vaccines. This information is not intended to replace advice given to you by your health care provider. Make sure you discuss any questions you have with your health care provider. Document Released: 06/06/2001 Document Revised: 12/20/2017 Document Reviewed: 12/20/2017 Elsevier Patient Education  2020 [employer].

## 2019-01-31 NOTE — Addendum Note (Signed)
Addended by: Jacqualin Combes on: 01/31/2019 04:41 PM   Modules accepted: Orders

## 2019-01-31 NOTE — Assessment & Plan Note (Signed)
Improved. Continue to monitor. 

## 2019-01-31 NOTE — Assessment & Plan Note (Signed)
Following with pain management for chronic neck, shoulder, upper back pain. Recently underwent injection with improvement.

## 2019-01-31 NOTE — Assessment & Plan Note (Signed)
Fungal appearing rash to left inner thigh. Treat with ciclopirox BID x 1 week. She will update.

## 2019-01-31 NOTE — Assessment & Plan Note (Signed)
Recent A1C about the same as last year, no improvement. Strongly advised she work on dietary changes and exercise.  Continue to monitor.

## 2019-01-31 NOTE — Assessment & Plan Note (Signed)
Tetanus UTD, influenza vaccination provided. Pap smear UTD. Discussed the importance of a healthy diet and regular exercise in order for weight loss, and to reduce the risk of any potential medical problems. Exam today as noted. Labs reviewed.

## 2019-01-31 NOTE — Progress Notes (Signed)
Subjective:    Patient ID: Marin Roberts, female    DOB: Sep 18, 1980, 38 y.o.   MRN: [id number]  HPI  Ms. Malkiewicz is a 38 year old female who presents today for complete physical and a chief complaint of rash.  Her rash is located to the left inner thigh for which she first noticed 3 months ago. The rash is itchy, not painful. She's tried calamine lotion and cortisone cream without much improvement. Overall the rash has reduced some but has not resolved.   Immunizations: -Tetanus: Completed in 2019 -Influenza: Due today  Diet: She endorse a poor diet but is working to cut back on sweets and limit carbs. She is drinking little water, also drinks lemonwater, diet soda.  Exercise: She is not exercising   Eye exam: Completed several years ago Dental exam: Completes semi-annually Pap Smear: Completed in 2019  BP Readings from Last 3 Encounters:  01/31/19 126/86  06/10/18 130/88  05/16/18 119/88     Review of Systems  Constitutional: Negative for unexpected weight change.  HENT: Negative for rhinorrhea.   Respiratory: Negative for cough and shortness of breath.   Cardiovascular: Negative for chest pain.  Gastrointestinal: Negative for constipation and diarrhea.  Genitourinary: Negative for difficulty urinating and menstrual problem.  Musculoskeletal: Positive for arthralgias and neck pain.  Skin: Positive for rash.       Rash to left inner upper thigh for last three months. Itching.  Allergic/Immunologic: Negative for environmental allergies.  Neurological: Positive for headaches. Negative for dizziness and numbness.       Chronic headaches, 1-3 times weekly  Psychiatric/Behavioral:       Intermittent anxiety/depression, overall manages well on her own.        Past Medical History:  Diagnosis Date  . Anxiety   . Brachial neuritis   . Cataract    Post traumatic- per patient  . Cervical radiculopathy   . Cervical vertigo   . Degeneration of cervical intervertebral disc    . Depression   . Dizziness   . Headache   . Joint pain   . Muscle spasm   . Myalgia   . Myofascial pain   . Myositis   . Neuralgia   . Neuritis   . Occipital headache   . Pain management   . Postpartum care following cesarean delivery (07/31/12) 08/01/2012  . S/P primary low transverse C-section (07/31/12) 08/01/2012  . Swallowing difficulty   . Weakness of both hands   . Weakness of both legs      Social History   Socioeconomic History  . Marital status: Married    Spouse name: Antyuan   . Number of children: 1  . Years of education: BA  . Highest education level: Not on file  Occupational History  . Occupation: Unemployed,disability  Social Needs  . Financial resource strain: Not on file  . Food insecurity    Worry: Often true    Inability: Not on file  . Transportation needs    Medical: Not on file    Non-medical: Not on file  Tobacco Use  . Smoking status: Never Smoker  . Smokeless tobacco: Never Used  Substance and Sexual Activity  . Alcohol use: No    Alcohol/week: 0.0 standard drinks  . Drug use: No  . Sexual activity: Not on file  Lifestyle  . Physical activity    Days per week: Not on file    Minutes per session: Not on file  . Stress: Not on  file  Relationships  . Social Herbalist on phone: Not on file    Gets together: Not on file    Attends religious service: Not on file    Active member of club or organization: Not on file    Attends meetings of clubs or organizations: Not on file    Relationship status: Not on file  . Intimate partner violence    Fear of current or ex partner: Not on file    Emotionally abused: Not on file    Physically abused: Not on file    Forced sexual activity: Not on file  Other Topics Concern  . Not on file  Social History Narrative   Married   Right handed.   One child, daughter.   Caffeine use: Drinks green tea (Drinks 2-3 times per day)   Enjoys relaxing, spending time with her husband.    Past  Surgical History:  Procedure Laterality Date  . CERVICAL FUSION  04/2010   C3-C6  . CESAREAN SECTION N/A 07/31/2012   Procedure: CESAREAN SECTION;  Surgeon: Lovenia Kim, MD;  Location: [facility];  Service: Obstetrics;  Laterality: N/A;   EDD: 08/18/12  . ECTOPIC PREGNANCY SURGERY  04/2005    No family history on file.  No Known Allergies  Current Outpatient Medications on File Prior to Visit  Medication Sig Dispense Refill  . cyclobenzaprine (FLEXERIL) 10 MG tablet Take 10 mg by mouth 3 (three) times daily.     Marland Kitchen NUVARING 0.12-0.015 MG/24HR vaginal ring Place 1 each vaginally every 28 (twenty-eight) days.   11  . OXcarbazepine ER (OXTELLAR XR) 600 MG TB24 1 tab by mouth three times daily per pt report 60 tablet 0   No current facility-administered medications on file prior to visit.     BP 126/86   Pulse 91   Temp 97.8 F (36.6 C) (Temporal)   Ht 5\' 5"  (1.651 m)   Wt 216 lb 8 oz (98.2 kg)   SpO2 99%   BMI 36.03 kg/m    Objective:   Physical Exam  Constitutional: She is oriented to person, place, and time. She appears well-nourished.  HENT:  Right Ear: Tympanic membrane and ear canal normal.  Left Ear: Tympanic membrane and ear canal normal.  Mouth/Throat: Oropharynx is clear and moist.  Eyes: Pupils are equal, round, and reactive to light. EOM are normal.  Neck: Neck supple.  Cardiovascular: Normal rate and regular rhythm.  Respiratory: Effort normal and breath sounds normal.  GI: Soft. Bowel sounds are normal. There is no abdominal tenderness.  Musculoskeletal: Normal range of motion.  Neurological: She is alert and oriented to person, place, and time.  Skin: Skin is warm and dry. Rash noted.  2 cm circular rash with ring and flesh colored center.   Psychiatric: She has a normal mood and affect.           Assessment & Plan:

## 2019-04-10 ENCOUNTER — Encounter: Payer: Self-pay | Admitting: Family Medicine

## 2019-04-10 ENCOUNTER — Ambulatory Visit (INDEPENDENT_AMBULATORY_CARE_PROVIDER_SITE_OTHER): Payer: Commercial Managed Care - PPO | Admitting: Family Medicine

## 2019-04-10 ENCOUNTER — Other Ambulatory Visit: Payer: Self-pay

## 2019-04-10 VITALS — Wt 214.0 lb

## 2019-04-10 DIAGNOSIS — R195 Other fecal abnormalities: Secondary | ICD-10-CM | POA: Insufficient documentation

## 2019-04-10 NOTE — Patient Instructions (Signed)
Loose stools 1) Increase fiber in diet - veggies, whole grains 2) Consider Fiber supplement - metamucil 3) Consider Gas medication for stomach cramping  If no improvement next week - let me know and we can order stool studies. Would also recommend considering covid testing, though given only 1-2 bowel movements per day and safety precautions this is less likely.

## 2019-04-10 NOTE — Progress Notes (Signed)
    I connected with Chanel G Stokely on 04/10/19 at 11:20 AM EST by telephone and verified that I am speaking with the correct person using two identifiers.   I discussed the limitations, risks, security and privacy concerns of performing an evaluation and management service by telephone and the availability of in person appointments. I also discussed with the patient that there may be a patient responsible charge related to this service. The patient expressed understanding and agreed to proceed.  Patient location: Home Provider Location: [place] Participants: Lesleigh Noe and Henri G Laprade   Subjective:     JANILAH CZAR is a 38 y.o. female presenting for stomach cramping/pain (x 2 weeks. Every morning diarrhea. No fever.)     Diarrhea  This is a new problem. The current episode started 1 to 4 weeks ago. The problem occurs 2 to 4 times per day. The problem has been waxing and waning. Associated symptoms include abdominal pain (cramping). Pertinent negatives include no chills, coughing, fever, myalgias, sweats, URI or vomiting. She has tried increased fluids (apple cider vinegar, ginger ale) for the symptoms.   Some days will go 3 times On average is going 1-2 times per day No blood in the stool, no mucus Poorly formed No new food or diet change Typical Diet: morning - eggs/ham, skip lunch, early dinner - spaghetti, chicken, avoids greasy foods only eating one time per day due to stomach pain, has not been eating a lot lately   No new medication Did restart indomethacin 2-3 months ago after a break, no other new medications  Review of Systems  Constitutional: Negative for chills and fever.  Respiratory: Negative for cough.   Gastrointestinal: Positive for abdominal pain (cramping) and diarrhea. Negative for vomiting.  Musculoskeletal: Negative for myalgias.     Social History   Tobacco Use  Smoking Status Never Smoker  Smokeless Tobacco Never Used        Objective:   BP Readings from Last 3 Encounters:  01/31/19 126/86  06/10/18 130/88  05/16/18 119/88   Wt Readings from Last 3 Encounters:  04/10/19 214 lb (97.1 kg)  01/31/19 216 lb 8 oz (98.2 kg)  06/10/18 204 lb (92.5 kg)    Wt 214 lb (97.1 kg) Comment: per patient  BMI 35.61 kg/m   No distress Speaking in full sentences Clear responses, alert, oriented Good mood.       Assessment & Plan:   Problem List Items Addressed This Visit      Other   Loose stools - Primary     Discussed that given only 1-2 BM per day does not meet requirement for diarrhea, advised increasing fiber.   If persisting can rule-out infectious cause.   Does not leave house and practicing social distancing and lack of other symptoms make covid less likely, but may want to consider pursuing testing if no improvement.     Interactive audio and video telecommunications were attempted between this provider and patient, however failed, due to patient having technical difficulties OR patient did not have access to video capability.  We continued and completed visit with audio only.   Start Time: 11:30 End Time: 11:41    Return if symptoms worsen or fail to improve.  Lesleigh Noe, MD

## 2019-07-31 ENCOUNTER — Ambulatory Visit: Payer: Commercial Managed Care - PPO | Attending: Internal Medicine

## 2019-07-31 DIAGNOSIS — Z23 Encounter for immunization: Secondary | ICD-10-CM

## 2019-07-31 NOTE — Progress Notes (Signed)
   Covid-19 Vaccination Clinic  Name:  Victoria Wagner    MRN: [id number] DOB: 1980-05-08  07/31/2019  Victoria Wagner was observed post Covid-19 immunization for 15 minutes without incident. She was provided with Vaccine Information Sheet and instruction to access the V-Safe system.   Victoria Wagner was instructed to call 911 with any severe reactions post vaccine: Marland Kitchen Difficulty breathing  . Swelling of face and throat  . A fast heartbeat  . A bad rash all over body  . Dizziness and weakness   Immunizations Administered    Name Date Dose VIS Date Route   Pfizer COVID-19 Vaccine 07/31/2019  2:50 PM 0.3 mL 04/04/2019 Intramuscular   Manufacturer: [place]   Lot: [id number]   [place]: [id number]

## 2019-08-25 ENCOUNTER — Ambulatory Visit: Payer: Commercial Managed Care - PPO | Attending: Internal Medicine

## 2019-08-25 DIAGNOSIS — Z23 Encounter for immunization: Secondary | ICD-10-CM

## 2019-08-25 NOTE — Progress Notes (Signed)
   Covid-19 Vaccination Clinic  Name:  KORTLYN TAKANO    MRN: [id number] DOB: 09/25/1980  08/25/2019  Ms. Northrup was observed post Covid-19 immunization for 15 minutes without incident. She was provided with Vaccine Information Sheet and instruction to access the V-Safe system.   Ms. Fretz was instructed to call 911 with any severe reactions post vaccine: Marland Kitchen Difficulty breathing  . Swelling of face and throat  . A fast heartbeat  . A bad rash all over body  . Dizziness and weakness   Immunizations Administered    Name Date Dose VIS Date Route   Pfizer COVID-19 Vaccine 08/25/2019  3:37 PM 0.3 mL 06/18/2018 Intramuscular   Manufacturer: [place]   Lot: [id number]   [place]: [id number]

## 2019-11-21 ENCOUNTER — Other Ambulatory Visit: Payer: Self-pay

## 2019-11-21 ENCOUNTER — Ambulatory Visit: Payer: Commercial Managed Care - PPO | Admitting: Family Medicine

## 2019-11-21 ENCOUNTER — Encounter: Payer: Self-pay | Admitting: Family Medicine

## 2019-11-21 VITALS — BP 112/84 | HR 86 | Temp 98.5°F | Ht 65.5 in | Wt 223.8 lb

## 2019-11-21 DIAGNOSIS — K5900 Constipation, unspecified: Secondary | ICD-10-CM | POA: Diagnosis not present

## 2019-11-21 DIAGNOSIS — K648 Other hemorrhoids: Secondary | ICD-10-CM | POA: Diagnosis not present

## 2019-11-21 MED ORDER — HYDROCORTISONE (PERIANAL) 2.5 % EX CREA: 1.0000 "application " | TOPICAL_CREAM | Freq: Two times a day (BID) | CUTANEOUS | 0 refills | Status: DC

## 2019-11-21 NOTE — Progress Notes (Signed)
Chief Complaint  Patient presents with   Rectal Pain    with bowel movements    History of Present Illness: HPI   39 year old female patient of Allie Bossier NP who presents with new onset rectal pain.   She reports  Her BMs are " backed up" only small BMs in last 5 days.  She has pain in  Her rectum.. with BMs and occ when walking around house.  No blood in stool.  Abdomen feel full, bloated, gassy... pain with passing gas.  She has sat on heating pad.  History of constipation with her relafen in the pain.  No history of bowel disease.  Has not taken any med.. she has tried apple juice.  She is drinking a lot of water.  No dysuria. She has no menses on Nuvaring. Doubt menses.  This visit occurred during the SARS-CoV-2 public health emergency.  Safety protocols were in place, including screening questions prior to the visit, additional usage of staff PPE, and extensive cleaning of exam room while observing appropriate contact time as indicated for disinfecting solutions.   COVID 19 screen:  No recent travel or known exposure to COVID19 The patient denies respiratory symptoms of COVID 19 at this time. The importance of social distancing was discussed today.     Review of Systems  Constitutional: Negative for chills and fever.  HENT: Negative for congestion and ear pain.   Eyes: Negative for pain and redness.  Respiratory: Negative for cough and shortness of breath.   Cardiovascular: Negative for chest pain, palpitations and leg swelling.  Gastrointestinal: Positive for abdominal pain and constipation. Negative for blood in stool, diarrhea, melena, nausea and vomiting.  Genitourinary: Negative for dysuria.  Musculoskeletal: Negative for falls and myalgias.  Skin: Negative for rash.  Neurological: Negative for dizziness.  Psychiatric/Behavioral: Negative for depression. The patient is not nervous/anxious.       Past Medical History:  Diagnosis Date   Anxiety     Brachial neuritis    Cataract    Post traumatic- per patient   Cervical radiculopathy    Cervical vertigo    Degeneration of cervical intervertebral disc    Depression    Dizziness    Headache    Joint pain    Muscle spasm    Myalgia    Myofascial pain    Myositis    Neuralgia    Neuritis    Occipital headache    Pain management    Postpartum care following cesarean delivery (07/31/12) 08/01/2012   S/P primary low transverse C-section (07/31/12) 08/01/2012   Swallowing difficulty    Weakness of both hands    Weakness of both legs     reports that she has never smoked. She has never used smokeless tobacco. She reports that she does not drink alcohol and does not use drugs.   Current Outpatient Medications:    cyclobenzaprine (FLEXERIL) 10 MG tablet, Take 10 mg by mouth 3 (three) times daily. , Disp: , Rfl:    imipramine (TOFRANIL) 25 MG tablet, Take 4 tablets by mouth at bedtime., Disp: , Rfl:    Nabumetone (RELAFEN DS) 1000 MG TABS, Take 1 tablet by mouth 2 (two) times daily as needed., Disp: , Rfl:    NUVARING 0.12-0.015 MG/24HR vaginal ring, Place 1 each vaginally every 28 (twenty-eight) days. , Disp: , Rfl: 11   OXTELLAR XR 300 MG TB24, Take 1 tablet by mouth in the morning, at noon, and at bedtime., Disp: , Rfl:  Observations/Objective: Blood pressure 112/84, pulse 86, temperature 98.5 F (36.9 C), temperature source Temporal, height 5' 5.5" (1.664 m), weight (!) 223 lb 12 oz (101.5 kg), SpO2 99 %.  Physical Exam Constitutional:      General: She is not in acute distress.    Appearance: Normal appearance. She is well-developed. She is not ill-appearing or toxic-appearing.  HENT:     Head: Normocephalic.     Right Ear: Hearing, tympanic membrane, ear canal and external ear normal. Tympanic membrane is not erythematous, retracted or bulging.     Left Ear: Hearing, tympanic membrane, ear canal and external ear normal. Tympanic membrane is not  erythematous, retracted or bulging.     Nose: No mucosal edema or rhinorrhea.     Right Sinus: No maxillary sinus tenderness or frontal sinus tenderness.     Left Sinus: No maxillary sinus tenderness or frontal sinus tenderness.     Mouth/Throat:     Pharynx: Uvula midline.  Eyes:     General: Lids are normal. Lids are everted, no foreign bodies appreciated.     Conjunctiva/sclera: Conjunctivae normal.     Pupils: Pupils are equal, round, and reactive to light.  Neck:     Thyroid: No thyroid mass or thyromegaly.     Vascular: No carotid bruit.     Trachea: Trachea normal.  Cardiovascular:     Rate and Rhythm: Normal rate and regular rhythm.     Pulses: Normal pulses.     Heart sounds: Normal heart sounds, S1 normal and S2 normal. No murmur heard.  No friction rub. No gallop.   Pulmonary:     Effort: Pulmonary effort is normal. No tachypnea or respiratory distress.     Breath sounds: Normal breath sounds. No decreased breath sounds, wheezing, rhonchi or rales.  Abdominal:     General: Bowel sounds are normal.     Palpations: Abdomen is soft.     Tenderness: There is no abdominal tenderness.  Musculoskeletal:     Cervical back: Normal range of motion and neck supple.  Skin:    General: Skin is warm and dry.     Findings: No rash.  Neurological:     Mental Status: She is alert.  Psychiatric:        Mood and Affect: Mood is not anxious or depressed.        Speech: Speech normal.        Behavior: Behavior normal. Behavior is cooperative.        Thought Content: Thought content normal.        Judgment: Judgment normal.      After explaining the procedure, informed consent was obtained.   Using the anoscope instrument, anoscopy was carried out.  Proceeded to 2-3 cm.  Findings: normal mucosa without polyps, tumors, fissure or diverticula, internal hemorrhoids noted, procedure well tolerated without complications.  No complications were encountered;  the procedure was well   tolerated.    ASSESSMENT:  normal anoscopy, small internal hemorrhoids.    Assessment and Plan   Acute constipation Likely cause of pain.  No sign of fissure.  Treat with miralax, fiber and water, Increase walking.  Internal hemorrhoids Secondary to constipation. Treat constipation and treat with rectal hydrocortisone.     Eliezer Lofts, MD

## 2019-11-21 NOTE — Patient Instructions (Addendum)
Increase water intake and fiber in diet.  Start Miralax 17 g  Daily. Walking and exercise can also help with constipation.  Apply topical steroid cream internally for internal hemorrhoids twice daily  If no BM in 2 days of miralax daily increase to twice daily.  If still no relieveing BM can try milk of magnesia  And/ or fleets enema. Call if pain not improving as expected.

## 2019-11-21 NOTE — Assessment & Plan Note (Signed)
Secondary to constipation. Treat constipation and treat with rectal hydrocortisone.

## 2019-11-21 NOTE — Assessment & Plan Note (Signed)
Likely cause of pain.  No sign of fissure.  Treat with miralax, fiber and water, Increase walking.

## 2020-01-30 ENCOUNTER — Encounter: Payer: Commercial Managed Care - PPO | Admitting: Primary Care

## 2020-02-13 ENCOUNTER — Telehealth: Payer: Self-pay | Admitting: Physical Medicine and Rehabilitation

## 2020-02-13 NOTE — Telephone Encounter (Signed)
Pt called wanting to know if her Dr. Ace Gins sent in a referral would Dr.Newton give her an injection? Pt doesn't want to start care with [facility] she just needs him for the injection then she will f/u with Dr.Bethea. Pt would like a CB with answer  [phone]

## 2020-02-17 NOTE — Telephone Encounter (Signed)
Called patient and advised that we have not yet received a referral from Dr. Ace Gins.

## 2020-02-19 ENCOUNTER — Ambulatory Visit: Payer: Commercial Managed Care - PPO | Admitting: Primary Care

## 2020-02-25 ENCOUNTER — Encounter: Payer: Self-pay | Admitting: Primary Care

## 2020-02-25 ENCOUNTER — Other Ambulatory Visit: Payer: Self-pay

## 2020-02-25 ENCOUNTER — Ambulatory Visit (INDEPENDENT_AMBULATORY_CARE_PROVIDER_SITE_OTHER): Payer: Commercial Managed Care - PPO | Admitting: Primary Care

## 2020-02-25 VITALS — BP 132/78 | HR 97 | Temp 98.0°F | Ht 65.51 in | Wt 227.5 lb

## 2020-02-25 DIAGNOSIS — Z0000 Encounter for general adult medical examination without abnormal findings: Secondary | ICD-10-CM | POA: Diagnosis not present

## 2020-02-25 DIAGNOSIS — Z23 Encounter for immunization: Secondary | ICD-10-CM

## 2020-02-25 DIAGNOSIS — E559 Vitamin D deficiency, unspecified: Secondary | ICD-10-CM | POA: Diagnosis not present

## 2020-02-25 DIAGNOSIS — R7303 Prediabetes: Secondary | ICD-10-CM | POA: Diagnosis not present

## 2020-02-25 DIAGNOSIS — G8929 Other chronic pain: Secondary | ICD-10-CM

## 2020-02-25 LAB — COMPREHENSIVE METABOLIC PANEL
ALT: 16 U/L (ref 0–35)
AST: 11 U/L (ref 0–37)
Albumin: 3.9 g/dL (ref 3.5–5.2)
Alkaline Phosphatase: 125 U/L — ABNORMAL HIGH (ref 39–117)
BUN: 13 mg/dL (ref 6–23)
CO2: 29 mEq/L (ref 19–32)
Calcium: 9.1 mg/dL (ref 8.4–10.5)
Chloride: 104 mEq/L (ref 96–112)
Creatinine, Ser: 0.89 mg/dL (ref 0.40–1.20)
GFR: 81.83 mL/min (ref >60.00)
Glucose, Bld: 88 mg/dL (ref 70–99)
Potassium: 4.1 mEq/L (ref 3.5–5.1)
Sodium: 140 mEq/L (ref 135–145)
Total Bilirubin: 0.4 mg/dL (ref 0.2–1.2)
Total Protein: 7.2 g/dL (ref 6.0–8.3)

## 2020-02-25 LAB — LIPID PANEL
Cholesterol: 185 mg/dL (ref 0–200)
HDL: 52.4 mg/dL (ref >39.00)
LDL Cholesterol: 111 mg/dL — ABNORMAL HIGH (ref 0–99)
NonHDL: 132.38
Total CHOL/HDL Ratio: 4
Triglycerides: 106 mg/dL (ref 0.0–149.0)
VLDL: 21.2 mg/dL (ref 0.0–40.0)

## 2020-02-25 LAB — VITAMIN D 25 HYDROXY (VIT D DEFICIENCY, FRACTURES): VITD: 38.94 ng/mL (ref 30.00–100.00)

## 2020-02-25 LAB — HEMOGLOBIN A1C: Hgb A1c MFr Bld: 6.4 % (ref 4.6–6.5)

## 2020-02-25 NOTE — Assessment & Plan Note (Signed)
Influenza vaccination provided today. Pap smear UTD per patient, follows with GYN. Mammogram due next year.  Discussed the importance of a healthy diet and regular exercise in order for weight loss, and to reduce the risk of any potential medical problems.  Exam today unremarkable. Labs pending.

## 2020-02-25 NOTE — Assessment & Plan Note (Signed)
Following with [facility] rejuvenation for chronic neck pain. Continue prescribed regimen.

## 2020-02-25 NOTE — Patient Instructions (Signed)
Stop by the lab prior to leaving today. I will notify you of your results once received.   Continue to work on a healthy diet. Ensure you are consuming 64 ounces of water daily.  Increase activity level.   It was a pleasure to see you today!   Preventive Care 1-39 Years Old, Female Preventive care refers to visits with your health care provider and lifestyle choices that can promote health and wellness. This includes:  A yearly physical exam. This may also be called an annual well check.  Regular dental visits and eye exams.  Immunizations.  Screening for certain conditions.  Healthy lifestyle choices, such as eating a healthy diet, getting regular exercise, not using drugs or products that contain nicotine and tobacco, and limiting alcohol use. What can I expect for my preventive care visit? Physical exam Your health care provider will check your:  Height and weight. This may be used to calculate body mass index (BMI), which tells if you are at a healthy weight.  Heart rate and blood pressure.  Skin for abnormal spots. Counseling Your health care provider may ask you questions about your:  Alcohol, tobacco, and drug use.  Emotional well-being.  Home and relationship well-being.  Sexual activity.  Eating habits.  Work and work Astronomer.  Method of birth control.  Menstrual cycle.  Pregnancy history. What immunizations do I need?  Influenza (flu) vaccine  This is recommended every year. Tetanus, diphtheria, and pertussis (Tdap) vaccine  You may need a Td booster every 10 years. Varicella (chickenpox) vaccine  You may need this if you have not been vaccinated. Human papillomavirus (HPV) vaccine  If recommended by your health care provider, you may need three doses over 6 months. Measles, mumps, and rubella (MMR) vaccine  You may need at least one dose of MMR. You may also need a second dose. Meningococcal conjugate (MenACWY) vaccine  One dose is  recommended if you are age 52-21 years and a first-year college student living in a residence hall, or if you have one of several medical conditions. You may also need additional booster doses. Pneumococcal conjugate (PCV13) vaccine  You may need this if you have certain conditions and were not previously vaccinated. Pneumococcal polysaccharide (PPSV23) vaccine  You may need one or two doses if you smoke cigarettes or if you have certain conditions. Hepatitis A vaccine  You may need this if you have certain conditions or if you travel or work in places where you may be exposed to hepatitis A. Hepatitis B vaccine  You may need this if you have certain conditions or if you travel or work in places where you may be exposed to hepatitis B. Haemophilus influenzae type b (Hib) vaccine  You may need this if you have certain conditions. You may receive vaccines as individual doses or as more than one vaccine together in one shot (combination vaccines). Talk with your health care provider about the risks and benefits of combination vaccines. What tests do I need?  Blood tests  Lipid and cholesterol levels. These may be checked every 5 years starting at age 6.  Hepatitis C test.  Hepatitis B test. Screening  Diabetes screening. This is done by checking your blood sugar (glucose) after you have not eaten for a while (fasting).  Sexually transmitted disease (STD) testing.  BRCA-related cancer screening. This may be done if you have a family history of breast, ovarian, tubal, or peritoneal cancers.  Pelvic exam and Pap test. This may be  done every 3 years starting at age 36. Starting at age 51, this may be done every 5 years if you have a Pap test in combination with an HPV test. Talk with your health care provider about your test results, treatment options, and if necessary, the need for more tests. Follow these instructions at home: Eating and drinking   Eat a diet that includes fresh  fruits and vegetables, whole grains, lean protein, and low-fat dairy.  Take vitamin and mineral supplements as recommended by your health care provider.  Do not drink alcohol if: ? Your health care provider tells you not to drink. ? You are pregnant, may be pregnant, or are planning to become pregnant.  If you drink alcohol: ? Limit how much you have to 0-1 drink a day. ? Be aware of how much alcohol is in your drink. In the [place] one drink equals one 12 oz bottle of beer (355 mL), one 5 oz glass of wine (148 mL), or one 1 oz glass of hard liquor (44 mL). Lifestyle  Take daily care of your teeth and gums.  Stay active. Exercise for at least 30 minutes on 5 or more days each week.  Do not use any products that contain nicotine or tobacco, such as cigarettes, e-cigarettes, and chewing tobacco. If you need help quitting, ask your health care provider.  If you are sexually active, practice safe sex. Use a condom or other form of birth control (contraception) in order to prevent pregnancy and STIs (sexually transmitted infections). If you plan to become pregnant, see your health care provider for a preconception visit. What's next?  Visit your health care provider once a year for a well check visit.  Ask your health care provider how often you should have your eyes and teeth checked.  Stay up to date on all vaccines. This information is not intended to replace advice given to you by your health care provider. Make sure you discuss any questions you have with your health care provider. Document Revised: 12/20/2017 Document Reviewed: 12/20/2017 Elsevier Patient Education  2020 [employer].

## 2020-02-25 NOTE — Assessment & Plan Note (Signed)
Repeat A1C pending.  Discussed the importance of a healthy diet and regular exercise in order for weight loss, and to reduce the risk of any potential medical problems.  

## 2020-02-25 NOTE — Assessment & Plan Note (Signed)
Compliant to vitamin D3 1000 IU daily. Repeat vitamin D pending.

## 2020-02-25 NOTE — Progress Notes (Signed)
Subjective:    Patient ID: Victoria Wagner, female    DOB: 1980/07/22, 39 y.o.   MRN: [id number]  HPI  This visit occurred during the SARS-CoV-2 public health emergency.  Safety protocols were in place, including screening questions prior to the visit, additional usage of staff PPE, and extensive cleaning of exam room while observing appropriate contact time as indicated for disinfecting solutions.   Ms. Victoria Wagner is a 39 year old female who presents today for complete physical.  Immunizations: -Tetanus: Completed in 2019 -Influenza: Due today -Covid-19: Completed series  Diet: She endorses a fair diet. Exercise: No regular exercise.  Eye exam: No recent exam, will schedule.  Dental exam: Completes semi-annually   Pap Smear: Follows with GYN, due in November 2021.  BP Readings from Last 3 Encounters:  02/25/20 132/78  11/21/19 112/84  01/31/19 126/86     Review of Systems  Constitutional: Negative for unexpected weight change.  HENT: Negative for rhinorrhea.   Eyes: Negative for visual disturbance.  Respiratory: Negative for cough and shortness of breath.   Cardiovascular: Negative for chest pain.  Gastrointestinal: Negative for constipation and diarrhea.  Genitourinary: Negative for difficulty urinating.  Musculoskeletal: Positive for arthralgias and neck pain.  Skin: Negative for rash.  Allergic/Immunologic: Negative for environmental allergies.  Neurological: Positive for numbness and headaches.       Recent epidural injection to cervical spine for chronic neck pain  Psychiatric/Behavioral: The patient is nervous/anxious.        Overall able to manage on her own.       Past Medical History:  Diagnosis Date  . Anxiety   . Brachial neuritis   . Cataract    Post traumatic- per patient  . Cervical radiculopathy   . Cervical vertigo   . Degeneration of cervical intervertebral disc   . Depression   . Dizziness   . Headache   . Joint pain   . Muscle spasm   .  Myalgia   . Myofascial pain   . Myositis   . Neuralgia   . Neuritis   . Occipital headache   . Pain management   . Postpartum care following cesarean delivery (07/31/12) 08/01/2012  . S/P primary low transverse C-section (07/31/12) 08/01/2012  . Swallowing difficulty   . Weakness of both hands   . Weakness of both legs      Social History   Socioeconomic History  . Marital status: Married    Spouse name: Antyuan   . Number of children: 1  . Years of education: BA  . Highest education level: Not on file  Occupational History  . Occupation: Unemployed,disability  Tobacco Use  . Smoking status: Never Smoker  . Smokeless tobacco: Never Used  Substance and Sexual Activity  . Alcohol use: No    Alcohol/week: 0.0 standard drinks  . Drug use: No  . Sexual activity: Not on file  Other Topics Concern  . Not on file  Social History Narrative   Married   Right handed.   One child, daughter.   Caffeine use: Drinks green tea (Drinks 2-3 times per day)   Enjoys relaxing, spending time with her husband.   Social Determinants of Health   Financial Resource Strain:   . Difficulty of Paying Living Expenses: Not on file  Food Insecurity:   . Worried About Charity fundraiser in the Last Year: Not on file  . Ran Out of Food in the Last Year: Not on file  Transportation Needs:   .  Lack of Transportation (Medical): Not on file  . Lack of Transportation (Non-Medical): Not on file  Physical Activity:   . Days of Exercise per Week: Not on file  . Minutes of Exercise per Session: Not on file  Stress:   . Feeling of Stress : Not on file  Social Connections:   . Frequency of Communication with Friends and Family: Not on file  . Frequency of Social Gatherings with Friends and Family: Not on file  . Attends Religious Services: Not on file  . Active Member of Clubs or Organizations: Not on file  . Attends Archivist Meetings: Not on file  . Marital Status: Not on file  Intimate  Partner Violence:   . Fear of Current or Ex-Partner: Not on file  . Emotionally Abused: Not on file  . Physically Abused: Not on file  . Sexually Abused: Not on file    Past Surgical History:  Procedure Laterality Date  . CERVICAL FUSION  04/2010   C3-C6  . CESAREAN SECTION N/A 07/31/2012   Procedure: CESAREAN SECTION;  Surgeon: Lovenia Kim, MD;  Location: [facility];  Service: Obstetrics;  Laterality: N/A;   EDD: 08/18/12  . ECTOPIC PREGNANCY SURGERY  04/2005    No family history on file.  No Known Allergies  Current Outpatient Medications on File Prior to Visit  Medication Sig Dispense Refill  . cyclobenzaprine (FLEXERIL) 10 MG tablet Take 10 mg by mouth 3 (three) times daily.     . Nabumetone (RELAFEN DS) 1000 MG TABS Take 1 tablet by mouth 2 (two) times daily as needed.    Marland Kitchen NUVARING 0.12-0.015 MG/24HR vaginal ring Place 1 each vaginally every 28 (twenty-eight) days.   11  . OXTELLAR XR 300 MG TB24 Take 1 tablet by mouth in the morning, at noon, and at bedtime.     No current facility-administered medications on file prior to visit.    BP 132/78 (BP Location: Left Arm, Patient Position: Sitting)   Pulse 97   Temp 98 F (36.7 C)   Ht 5' 5.51" (1.664 m)   Wt 227 lb 8 oz (103.2 kg)   SpO2 99%   BMI 37.27 kg/m    Objective:   Physical Exam HENT:     Right Ear: Tympanic membrane and ear canal normal.     Left Ear: Tympanic membrane and ear canal normal.  Eyes:     Pupils: Pupils are equal, round, and reactive to light.  Cardiovascular:     Rate and Rhythm: Normal rate and regular rhythm.  Pulmonary:     Effort: Pulmonary effort is normal.     Breath sounds: Normal breath sounds.  Abdominal:     General: Bowel sounds are normal.     Palpations: Abdomen is soft.     Tenderness: There is no abdominal tenderness.  Musculoskeletal:        General: Normal range of motion.     Cervical back: Neck supple.  Skin:    General: Skin is warm and dry.  Neurological:      Mental Status: She is alert and oriented to person, place, and time.     Cranial Nerves: No cranial nerve deficit.     Deep Tendon Reflexes:     Reflex Scores:      Patellar reflexes are 2+ on the right side and 2+ on the left side. Psychiatric:        Mood and Affect: Mood normal.  Assessment & Plan:

## 2020-03-02 ENCOUNTER — Telehealth: Payer: Self-pay | Admitting: Primary Care

## 2020-03-02 NOTE — Telephone Encounter (Signed)
Completed and placed in Joellen's inbox. 

## 2020-03-02 NOTE — Telephone Encounter (Signed)
Placed in your box for review.  °

## 2020-03-02 NOTE — Telephone Encounter (Signed)
Patient's husband came into office and dropped off annual physical form to be filled out. Please advise. Placed in tower.

## 2020-03-03 NOTE — Telephone Encounter (Signed)
Form filled out and faxed my chart sent to patient to let know.

## 2020-03-04 ENCOUNTER — Other Ambulatory Visit: Payer: Self-pay

## 2020-03-04 ENCOUNTER — Other Ambulatory Visit: Payer: Self-pay | Admitting: Physical Medicine and Rehabilitation

## 2020-03-04 ENCOUNTER — Ambulatory Visit
Admission: RE | Admit: 2020-03-04 | Discharge: 2020-03-04 | Disposition: A | Discharge status: Home / Self Care | Payer: Commercial Managed Care - PPO | Source: Ambulatory Visit | Attending: Physical Medicine and Rehabilitation | Admitting: Physical Medicine and Rehabilitation

## 2020-03-04 DIAGNOSIS — M961 Postlaminectomy syndrome, not elsewhere classified: Secondary | ICD-10-CM

## 2020-03-10 NOTE — Telephone Encounter (Signed)
Copy mailed.

## 2020-04-09 ENCOUNTER — Ambulatory Visit: Payer: Commercial Managed Care - PPO | Attending: Internal Medicine

## 2020-04-09 DIAGNOSIS — Z23 Encounter for immunization: Secondary | ICD-10-CM

## 2020-04-09 NOTE — Progress Notes (Signed)
° °  Covid-19 Vaccination Clinic  Name:  Victoria Wagner    MRN: [id number] DOB: [birth year]  04/09/2020  Victoria Wagner was observed post Covid-19 immunization for 15 minutes without incident. She was provided with Vaccine Information Sheet and instruction to access the V-Safe system.   Victoria Wagner was instructed to call 911 with any severe reactions post vaccine:  Difficulty breathing   Swelling of face and throat   A fast heartbeat   A bad rash all over body   Dizziness and weakness   Immunizations Administered    Name Date Dose VIS Date Route   Pfizer COVID-19 Vaccine 04/09/2020  3:08 PM 0.3 mL 02/11/2020 Intramuscular   Manufacturer: [place]   Lot: [id number]   [place]: [id number]

## 2020-05-12 ENCOUNTER — Other Ambulatory Visit: Payer: Self-pay | Admitting: Physical Medicine and Rehabilitation

## 2020-05-12 DIAGNOSIS — G894 Chronic pain syndrome: Secondary | ICD-10-CM

## 2020-05-12 DIAGNOSIS — M5412 Radiculopathy, cervical region: Secondary | ICD-10-CM

## 2020-05-12 DIAGNOSIS — M542 Cervicalgia: Secondary | ICD-10-CM

## 2020-05-12 DIAGNOSIS — M961 Postlaminectomy syndrome, not elsewhere classified: Secondary | ICD-10-CM

## 2020-06-15 ENCOUNTER — Other Ambulatory Visit: Payer: Self-pay

## 2020-06-15 ENCOUNTER — Ambulatory Visit
Admission: RE | Admit: 2020-06-15 | Discharge: 2020-06-15 | Disposition: A | Discharge status: Home / Self Care | Payer: Commercial Managed Care - PPO | Source: Ambulatory Visit | Attending: Physical Medicine and Rehabilitation | Admitting: Physical Medicine and Rehabilitation

## 2020-06-15 DIAGNOSIS — M542 Cervicalgia: Secondary | ICD-10-CM

## 2020-06-15 DIAGNOSIS — M5412 Radiculopathy, cervical region: Secondary | ICD-10-CM

## 2020-06-15 DIAGNOSIS — M961 Postlaminectomy syndrome, not elsewhere classified: Secondary | ICD-10-CM

## 2020-06-15 DIAGNOSIS — G894 Chronic pain syndrome: Secondary | ICD-10-CM

## 2020-07-12 ENCOUNTER — Ambulatory Visit
Admission: EM | Admit: 2020-07-12 | Discharge: 2020-07-12 | Disposition: A | Discharge status: Home / Self Care | Payer: Commercial Managed Care - PPO

## 2020-07-12 ENCOUNTER — Other Ambulatory Visit: Payer: Self-pay

## 2020-07-12 ENCOUNTER — Telehealth: Payer: Self-pay

## 2020-07-12 DIAGNOSIS — K6289 Other specified diseases of anus and rectum: Secondary | ICD-10-CM

## 2020-07-12 MED ORDER — HYDROCORTISONE ACETATE 25 MG RE SUPP: 25.0000 mg | Freq: Two times a day (BID) | RECTAL | 0 refills | Status: DC

## 2020-07-12 NOTE — Telephone Encounter (Signed)
[facility] - Client TELEPHONE ADVICE RECORD [facility] Patient Name: Victoria Wagner Gender: Female DOB: February 09, 1981 Age: 40 Y 58 M 11 D Return Phone Number: [phone] (Primary) Address: City/State/Zip: [place] [address] Client [facility] - Client Client Site [place] - Day Physician Alma Friendly - NP Contact Type Call Who Is Calling Patient / Member / Family / Caregiver Call Type Triage / Clinical Relationship To Patient Self Return Phone Number [phone] (Primary) Chief Complaint Rectum Injury Reason for Call Symptomatic / Request for [place] states she is experiencing sharp rectal pain. Translation No Nurse Assessment Nurse: Lucky Cowboy, RN, Levada Dy Date/Time (Eastern Time): 07/12/2020 10:13:07 AM Confirm and document reason for call. If symptomatic, describe symptoms. ---Caller stated that she has been having sharp rectal pain that started Wed but got better since having a BM. She stated that it started again yesterday morning. She had a BM this morning but has not relented since. Does the patient have any new or worsening symptoms? ---Yes Will a triage be completed? ---Yes Related visit to physician within the last 2 weeks? ---No Does the PT have any chronic conditions? (i.e. diabetes, asthma, this includes High risk factors for pregnancy, etc.) ---Yes List chronic conditions. ---constipation, chronic pain Is the patient pregnant or possibly pregnant? (Ask all females between the ages of 74-55) ---No Is this a behavioral health or substance abuse call? ---No Guidelines Guideline Title Affirmed Question Affirmed Notes Nurse Date/Time (Eastern Time) Rectal Symptoms SEVERE rectal pain (e.g., excruciating, unable to have a bowel movement) Dew, RN, Levada Dy 07/12/2020 10:15:57 AM Disp. Time Eilene Ghazi Time) Disposition Final User 07/12/2020 10:17:10 AM See HCP  within 4 Hours (or PCP triage) Yes Dew, RN, Levada Dy PLEASE NOTE: All timestamps contained within this report are represented as [place] Standard Time. CONFIDENTIALTY NOTICE: This fax transmission is intended only for the addressee. It contains information that is legally privileged, confidential or otherwise protected from use or disclosure. If you are not the intended recipient, you are strictly prohibited from reviewing, disclosing, copying using or disseminating any of this information or taking any action in reliance on or regarding this information. If you have received this fax in error, please notify us immediately by telephone so that we can arrange for its return to [place]. Phone: [phone], Toll-Free: [phone], Fax: [phone] Page: 2 of 2 Call Id: [id number] [place] Disagree/Comply Comply Caller Understands Yes PreDisposition Call Doctor Care Advice Given Per Guideline SEE HCP (OR PCP TRIAGE) WITHIN 4 HOURS: * IF OFFICE WILL BE OPEN: You need to be seen within the next 3 or 4 hours. Call your doctor (or NP/PA) now or as soon as the office opens. PAIN MEDICINES: * For pain relief, you can take either acetaminophen, ibuprofen, or naproxen. * They are over-the-counter (OTC) pain drugs. You can buy them at the drugstore. CALL BACK IF: * You become worse CARE ADVICE given per Rectal Symptoms (Adult) guideline. Comments User: Raford Pitcher, RN Date/Time Eilene Ghazi Time): 07/12/2020 10:19:18 AM Called backline and spoke with [place]. They do not have any app't today. Ins caller to go to an [facility] and she's not sure which one she will utilize. Referrals REFERRED TO PCP OFFICE Warm transfer to backline GO TO FACILITY UNDECIDED

## 2020-07-12 NOTE — ED Provider Notes (Signed)
Roderic Palau    CSN: [id number] Arrival date & time: 07/12/20  1111      History   Chief Complaint Chief Complaint  Patient presents with  . rectal pain    HPI Draven G Cuthbertson is a 40 y.o. female.   Patient is a 40 year old female presents today with rectal pain.  This is been intermittent issue since Wednesday.  Describes the pain is sharp in nature.  Reporting a few formed stools but does not feel like she is constipated.  No abdominal pain, nausea, vomiting.  No blood in stools.  Has not done anything to treat the symptoms.     Past Medical History:  Diagnosis Date  . Anxiety   . Brachial neuritis   . Cataract    Post traumatic- per patient  . Cervical radiculopathy   . Cervical vertigo   . Degeneration of cervical intervertebral disc   . Depression   . Dizziness   . Headache   . Joint pain   . Muscle spasm   . Myalgia   . Myofascial pain   . Myositis   . Neuralgia   . Neuritis   . Occipital headache   . Pain management   . Postpartum care following cesarean delivery (07/31/12) 08/01/2012  . S/P primary low transverse C-section (07/31/12) 08/01/2012  . Swallowing difficulty   . Weakness of both hands   . Weakness of both legs     Patient Active Problem List   Diagnosis Date Noted  . Acute constipation 11/21/2019  . Internal hemorrhoids 11/21/2019  . Loose stools 04/10/2019  . Rash and nonspecific skin eruption 01/31/2019  . Vitamin D deficiency 06/10/2018  . Prediabetes 02/25/2018  . Class 1 obesity with serious comorbidity and body mass index (BMI) of 34.0 to 34.9 in adult 02/11/2018  . Chronic pain 02/11/2018  . Elevated blood pressure reading 12/03/2017  . Preventative health care 11/05/2014  . Obesity (BMI 30.0-34.9) 08/21/2014  . Headache due to intracranial disease 08/06/2014  . Fatigue 08/06/2014    Past Surgical History:  Procedure Laterality Date  . CERVICAL FUSION  04/2010   C3-C6  . CESAREAN SECTION N/A 07/31/2012   Procedure:  CESAREAN SECTION;  Surgeon: Lovenia Kim, MD;  Location: [facility];  Service: Obstetrics;  Laterality: N/A;   EDD: 08/18/12  . ECTOPIC PREGNANCY SURGERY  04/2005    OB History    Gravida  2   Para  1   Term  1   Preterm      AB  1   Living  1     SAB      IAB      Ectopic  1   Multiple      Live Births  1            Home Medications    Prior to Admission medications   Medication Sig Start Date End Date Taking? Authorizing Provider  cyclobenzaprine (FLEXERIL) 10 MG tablet Take 10 mg by mouth 3 (three) times daily.  04/29/10  Yes [provider]  hydrocortisone (ANUSOL-HC) 25 MG suppository Place 1 suppository (25 mg total) rectally 2 (two) times daily. 07/12/20  Yes Mandy Peeks A, NP  indomethacin (INDOCIN SR) 75 MG CR capsule Take 75 mg by mouth every 12 (twelve) hours. 06/23/20  Yes [provider]  NUVARING 0.12-0.015 MG/24HR vaginal ring Place 1 each vaginally every 28 (twenty-eight) days.  07/20/14  Yes [provider]  OXTELLAR XR 300 MG TB24  Take 1 tablet by mouth in the morning, at noon, and at bedtime. 11/11/19  Yes [provider]    Family History History reviewed. No pertinent family history.  Social History Social History   Tobacco Use  . Smoking status: Never Smoker  . Smokeless tobacco: Never Used  Substance Use Topics  . Alcohol use: No    Alcohol/week: 0.0 standard drinks  . Drug use: No     Allergies   Patient has no known allergies.   Review of Systems Review of Systems   Physical Exam Triage Vital Signs ED Triage Vitals  Enc Vitals Group     BP 07/12/20 1124 (!) 150/96     Pulse Rate 07/12/20 1124 88     Resp 07/12/20 1124 18     Temp 07/12/20 1124 98.9 F (37.2 C)     Temp Source 07/12/20 1124 Oral     SpO2 07/12/20 1124 97 %     Weight --      Height --      Head Circumference --      Peak Flow --      Pain Score 07/12/20 1132 7     Pain Loc --      Pain Edu? --      Excl. in [place]?  --    No data found.  Updated Vital Signs BP (!) 150/96 (BP Location: Left Arm)   Pulse 88   Temp 98.9 F (37.2 C) (Oral)   Resp 18   SpO2 97%   Visual Acuity Right Eye Distance:   Left Eye Distance:   Bilateral Distance:    Right Eye Near:   Left Eye Near:    Bilateral Near:     Physical Exam Vitals and nursing note reviewed.  Constitutional:      General: She is not in acute distress.    Appearance: Normal appearance. She is not ill-appearing, toxic-appearing or diaphoretic.  HENT:     Head: Normocephalic.  Eyes:     Conjunctiva/sclera: Conjunctivae normal.  Pulmonary:     Effort: Pulmonary effort is normal.  Genitourinary:    Rectum: Internal hemorrhoid present. No external hemorrhoid.  Musculoskeletal:        General: Normal range of motion.     Cervical back: Normal range of motion.  Skin:    General: Skin is warm and dry.     Findings: No rash.  Neurological:     Mental Status: She is alert.  Psychiatric:        Mood and Affect: Mood normal.      UC Treatments / Results  Labs (all labs ordered are listed, but only abnormal results are displayed) Labs Reviewed - No data to display  EKG   Radiology No results found.  Procedures Procedures (including critical care time)  Medications Ordered in UC Medications - No data to display  Initial Impression / Assessment and Plan / UC Course  I have reviewed the triage vital signs and the nursing notes.  Pertinent labs & imaging results that were available during my care of the patient were reviewed by me and considered in my medical decision making (see chart for details).     Rectal pain Most likely from internal hemorrhoid.  No blood noted or hard stool noted on rectal exam Recommend continue to increase water, high-fiber. Prescribed hydrocortisone suppositories Follow up as needed for continued or worsening symptoms  Final Clinical Impressions(s) / UC Diagnoses   Final diagnoses:  Rectal  pain     Discharge Instructions     I believe you may have a small internal hemorrhoid.  Treating with hydrocortisone suppositories.  See you doctor as needed.     ED Prescriptions    Medication Sig Dispense Auth. Provider   hydrocortisone (ANUSOL-HC) 25 MG suppository Place 1 suppository (25 mg total) rectally 2 (two) times daily. 12 suppository Chancey Ringel A, NP     PDMP not reviewed this encounter.   Loura Halt A, NP 07/12/20 1502

## 2020-07-12 NOTE — Telephone Encounter (Signed)
I spoke with Victoria Wagner and she is on her way to [facility] in [place]; should be there in 5 mins. Pt is still having sharp rectal pain. Sending note to Gentry Fitz NP who is out of office and Dr Einar Pheasant who is in the office.

## 2020-07-12 NOTE — Telephone Encounter (Signed)
Agree with evaluation

## 2020-07-12 NOTE — ED Triage Notes (Signed)
Pt c/o sharp and pressure rectal pain intermittently since Wednesday. Has had BM but states she feels as though her rectum won't fully dilate to accommodate a formed stool and has soft stools. States it does not feel like constipation, which pt has had in the past  Has been increasing the fiber in her diet, w/o improvement.   Denies blood in stool, n/v/d, fever.  No OTC therapies used for symptoms

## 2020-07-12 NOTE — Telephone Encounter (Signed)
Pt called stating that her insurance would not cover the Rx for anusol suppositories. Per T. Rozanna Box, NP, pt can try using GoodRx for discounted cost of Rx, if not able to do so, then pt can try preparation H suppositories OTC. Pt verbalized understanding.

## 2020-07-12 NOTE — Discharge Instructions (Signed)
I believe you may have a small internal hemorrhoid.  Treating with hydrocortisone suppositories.  See you doctor as needed.

## 2020-07-13 NOTE — Telephone Encounter (Signed)
Will evaluate patient on 03/24 as scheduled.  UC notes reviewed.

## 2020-07-15 ENCOUNTER — Ambulatory Visit: Payer: Commercial Managed Care - PPO | Admitting: Primary Care

## 2020-07-15 ENCOUNTER — Other Ambulatory Visit: Payer: Self-pay

## 2020-07-15 ENCOUNTER — Encounter: Payer: Self-pay | Admitting: Primary Care

## 2020-07-15 DIAGNOSIS — K648 Other hemorrhoids: Secondary | ICD-10-CM | POA: Diagnosis not present

## 2020-07-15 NOTE — Assessment & Plan Note (Signed)
Noted on exam today, also with moderate to severe rectal pain.  Exam today overall stable, no distress, she is improving. Continue Preparation H cream. Discussed to purchase donut pillow for sitting, increasing fiber intake, increasing water.   Referral placed for GI evaluation given recurrent problem.

## 2020-07-15 NOTE — Progress Notes (Signed)
Subjective:    Patient ID: Victoria Wagner, female    DOB: 09-24-1980, 40 y.o.   MRN: [id number]  HPI  Victoria Wagner is a very pleasant 40 y.o. female with a history of internal hemorrhoids, chronic pain, prediabetes, constipation who presents today to discuss rectal pain.  Symptoms began about one week ago with intermittent, sharp pain rectal that "stopped me in my tracks" and also occurs with bowel movements. She has a history of constipation, but hasn't noticed firm stools but feels that her rectum isn't completely emptying.   She presented to Urgent Care three days ago, was prescribed Rx suppository for hemorrhoids but insurance would not cover so she's been using Preparation H cream with some improvement.   She does not typically drink much water, started to increase yesterday. She endorses a low fiber diet, but has been increasing intake recently. She is requesting a referral to GI today.    Review of Systems  Constitutional: Negative for fever.  Gastrointestinal: Positive for rectal pain. Negative for blood in stool, constipation and nausea.       Rectal pain         Past Medical History:  Diagnosis Date  . Anxiety   . Brachial neuritis   . Cataract    Post traumatic- per patient  . Cervical radiculopathy   . Cervical vertigo   . Degeneration of cervical intervertebral disc   . Depression   . Dizziness   . Headache   . Joint pain   . Muscle spasm   . Myalgia   . Myofascial pain   . Myositis   . Neuralgia   . Neuritis   . Occipital headache   . Pain management   . Postpartum care following cesarean delivery (07/31/12) 08/01/2012  . S/P primary low transverse C-section (07/31/12) 08/01/2012  . Swallowing difficulty   . Weakness of both hands   . Weakness of both legs     Social History   Socioeconomic History  . Marital status: Married    Spouse name: Antyuan   . Number of children: 1  . Years of education: BA  . Highest education level: Not on file   Occupational History  . Occupation: Unemployed,disability  Tobacco Use  . Smoking status: Never Smoker  . Smokeless tobacco: Never Used  Substance and Sexual Activity  . Alcohol use: No    Alcohol/week: 0.0 standard drinks  . Drug use: No  . Sexual activity: Not on file  Other Topics Concern  . Not on file  Social History Narrative   Married   Right handed.   One child, daughter.   Caffeine use: Drinks green tea (Drinks 2-3 times per day)   Enjoys relaxing, spending time with her husband.   Social Determinants of Health   Financial Resource Strain: Not on file  Food Insecurity: Not on file  Transportation Needs: Not on file  Physical Activity: Not on file  Stress: Not on file  Social Connections: Not on file  Intimate Partner Violence: Not on file    Past Surgical History:  Procedure Laterality Date  . CERVICAL FUSION  04/2010   C3-C6  . CESAREAN SECTION N/A 07/31/2012   Procedure: CESAREAN SECTION;  Surgeon: Lovenia Kim, MD;  Location: [facility];  Service: Obstetrics;  Laterality: N/A;   EDD: 08/18/12  . ECTOPIC PREGNANCY SURGERY  04/2005    History reviewed. No pertinent family history.  No Known Allergies  Current Outpatient Medications on File Prior to  Visit  Medication Sig Dispense Refill  . cyclobenzaprine (FLEXERIL) 10 MG tablet Take 10 mg by mouth 3 (three) times daily.     . indomethacin (INDOCIN SR) 75 MG CR capsule Take 75 mg by mouth every 12 (twelve) hours.    Marland Kitchen NUVARING 0.12-0.015 MG/24HR vaginal ring Place 1 each vaginally every 28 (twenty-eight) days.   11  . OXTELLAR XR 300 MG TB24 Take 1 tablet by mouth in the morning, at noon, and at bedtime.     No current facility-administered medications on file prior to visit.    BP 118/70   Pulse 90   Temp 97.7 F (36.5 C) (Temporal)   Ht 5' 5.5" (1.664 m)   Wt 225 lb 8 oz (102.3 kg)   SpO2 100%   BMI 36.95 kg/m  Objective:   Physical Exam Constitutional:      General: She is not in acute  distress. Genitourinary:    Rectum: Guaiac result negative. Tenderness and internal hemorrhoid present. No external hemorrhoid.  Neurological:     Mental Status: She is alert.           Assessment & Plan:      This visit occurred during the SARS-CoV-2 public health emergency.  Safety protocols were in place, including screening questions prior to the visit, additional usage of staff PPE, and extensive cleaning of exam room while observing appropriate contact time as indicated for disinfecting solutions.

## 2020-07-15 NOTE — Patient Instructions (Signed)
You will be contacted regarding your referral to GI.  Please let us know if you have not been contacted within two weeks.   Consider purchasing a donut pillow to sit on during the day.  Increase water and fiber intake.  It was a pleasure to see you today!   High-Fiber Eating Plan Fiber, also called dietary fiber, is a type of carbohydrate. It is found foods such as fruits, vegetables, whole grains, and beans. A high-fiber diet can have many health benefits. Your health care provider may recommend a high-fiber diet to help:  Prevent constipation. Fiber can make your bowel movements more regular.  Lower your cholesterol.  Relieve the following conditions: ? Inflammation of veins in the anus (hemorrhoids). ? Inflammation of specific areas of the digestive tract (uncomplicated diverticulosis). ? A problem of the large intestine, also called the colon, that sometimes causes pain and diarrhea (irritable bowel syndrome, or IBS).  Prevent overeating as part of a weight-loss plan.  Prevent heart disease, type 2 diabetes, and certain cancers. What are tips for following this plan? Reading food labels  Check the nutrition facts label on food products for the amount of dietary fiber. Choose foods that have 5 grams of fiber or more per serving.  The goals for recommended daily fiber intake include: ? Men (age 76 or younger): 34-38 g. ? Men (over age 61): 28-34 g. ? Women (age 94 or younger): 25-28 g. ? Women (over age 77): 22-25 g. Your daily fiber goal is _____________ g.   Shopping  Choose whole fruits and vegetables instead of processed forms, such as apple juice or applesauce.  Choose a wide variety of high-fiber foods such as avocados, lentils, oats, and kidney beans.  Read the nutrition facts label of the foods you choose. Be aware of foods with added fiber. These foods often have high sugar and sodium amounts per serving. Cooking  Use whole-grain flour for baking and  cooking.  Cook with brown rice instead of white rice. Meal planning  Start the day with a breakfast that is high in fiber, such as a cereal that contains 5 g of fiber or more per serving.  Eat breads and cereals that are made with whole-grain flour instead of refined flour or white flour.  Eat brown rice, bulgur wheat, or millet instead of white rice.  Use beans in place of meat in soups, salads, and pasta dishes.  Be sure that half of the grains you eat each day are whole grains. General information  You can get the recommended daily intake of dietary fiber by: ? Eating a variety of fruits, vegetables, grains, nuts, and beans. ? Taking a fiber supplement if you are not able to take in enough fiber in your diet. It is better to get fiber through food than from a supplement.  Gradually increase how much fiber you consume. If you increase your intake of dietary fiber too quickly, you may have bloating, cramping, or gas.  Drink plenty of water to help you digest fiber.  Choose high-fiber snacks, such as berries, raw vegetables, nuts, and popcorn. What foods should I eat? Fruits Berries. Pears. Apples. Oranges. Avocado. Prunes and raisins. Dried figs. Vegetables Sweet potatoes. Spinach. Kale. Artichokes. Cabbage. Broccoli. Cauliflower. Green peas. Carrots. Squash. Grains Whole-grain breads. Multigrain cereal. Oats and oatmeal. Brown rice. Barley. Bulgur wheat. [place]. Quinoa. Bran muffins. Popcorn. Rye wafer crackers. Meats and other proteins Navy beans, kidney beans, and pinto beans. Soybeans. Split peas. Lentils. Nuts and seeds. Dairy  Fiber-fortified yogurt. Beverages Fiber-fortified soy milk. Fiber-fortified orange juice. Other foods Fiber bars. The items listed above may not be a complete list of recommended foods and beverages. Contact a dietitian for more information. What foods should I avoid? Fruits Fruit juice. Cooked, strained fruit. Vegetables Fried potatoes.  Canned vegetables. Well-cooked vegetables. Grains White bread. Pasta made with refined flour. White rice. Meats and other proteins Fatty cuts of meat. Fried chicken or fried fish. Dairy Milk. Yogurt. Cream cheese. Sour cream. Fats and oils Butters. Beverages Soft drinks. Other foods Cakes and pastries. The items listed above may not be a complete list of foods and beverages to avoid. Talk with your dietitian about what choices are [place] for you. Summary  Fiber is a type of carbohydrate. It is found in foods such as fruits, vegetables, whole grains, and beans.  A high-fiber diet has many benefits. It can help to prevent constipation, lower blood cholesterol, aid weight loss, and reduce your risk of heart disease, diabetes, and certain cancers.  Increase your intake of fiber gradually. Increasing fiber too quickly may cause cramping, bloating, and gas. Drink plenty of water while you increase the amount of fiber you consume.  The [place] sources of fiber include whole fruits and vegetables, whole grains, nuts, seeds, and beans. This information is not intended to replace advice given to you by your health care provider. Make sure you discuss any questions you have with your health care provider. Document Revised: 08/14/2019 Document Reviewed: 08/14/2019 Elsevier Patient Education  2021 [employer].

## 2020-07-19 ENCOUNTER — Encounter: Payer: Self-pay | Admitting: *Deleted

## 2020-09-23 ENCOUNTER — Telehealth: Payer: Self-pay | Admitting: Primary Care

## 2020-09-23 NOTE — Telephone Encounter (Signed)
Patient call in for info about Victoria Wagner . Give her Phone number to schedule appt

## 2020-09-24 NOTE — Telephone Encounter (Signed)
Sorry wanted to make sure you gave patient number and I do not need to call back.

## 2020-09-24 NOTE — Telephone Encounter (Signed)
Yes patient was given the number for [facility]

## 2020-10-11 ENCOUNTER — Telehealth: Payer: Self-pay | Admitting: Primary Care

## 2020-10-13 NOTE — Telephone Encounter (Signed)
Patient called in and stated that kate didn't fill but her dermatologist filled it. And wanted to know if could fill it, she stated that when she was here in November she had asked if she needed any refills and at the time she said No. But now she does.

## 2020-10-14 DIAGNOSIS — R21 Rash and other nonspecific skin eruption: Secondary | ICD-10-CM

## 2020-10-14 NOTE — Telephone Encounter (Signed)
Noted and see my chart message.

## 2020-10-15 MED ORDER — DESONIDE 0.05 % EX OINT: TOPICAL_OINTMENT | CUTANEOUS | 0 refills | Status: DC

## 2020-10-15 MED ORDER — TACROLIMUS 0.1 % EX OINT: TOPICAL_OINTMENT | Freq: Two times a day (BID) | CUTANEOUS | 0 refills | Status: DC

## 2020-10-21 ENCOUNTER — Telehealth: Payer: Self-pay

## 2020-10-21 NOTE — Telephone Encounter (Signed)
Sedalia Wagner - Client TELEPHONE ADVICE RECORD [facility] Patient Name: Victoria Wagner EST Gender: Female DOB: 03-08-1981 Age: 40 Y 9 M 20 D Return Phone Number: [phone] (Primary), [phone] (Secondary) Address: [address] City/ State/ Zip: [place] [address] Client Victoria Wagner - Client Client Site [facility] Physician Alma Friendly - NP Contact Type Call Who Is Calling Patient / Member / Family / Caregiver Call Type Triage / Clinical Relationship To Patient Self Return Phone Number [phone] (Primary) Chief Complaint Abdominal Pain Reason for Call Symptomatic / Request for Victoria Wagner states she is experiencing abdominal swelling/pain. possibly due to some constipation Translation No Nurse Assessment Nurse: Vallery Sa, RN, [place] Date/Time (Eastern Time): 10/21/2020 7:35:16 AM Confirm and document reason for call. If symptomatic, describe symptoms. ---Victoria Wagner states her last bowel movement was yesterday (no blood present). She developed upper, bilateral abdominal pain and swelling about 5 days ago (current pain rated as a 6 on the 1 to 10 scale). No fever or vomiting. No injury. No hernias. Alert and responsive. Does the patient have any new or worsening symptoms? ---Yes Will a triage be completed? ---Yes Related visit to physician within the last 2 weeks? ---No Does the PT have any chronic conditions? (i.e. diabetes, asthma, this includes High risk factors for pregnancy, etc.) ---Yes List chronic conditions. ---Chronic Neck Pain Is the patient pregnant or possibly pregnant? (Ask all females between the ages of 26-55) ---No Is this a behavioral health or substance abuse call? ---No Guidelines Guideline Title Affirmed Question Affirmed Notes Nurse Date/Time Eilene Ghazi Time) Chest Pain [1] Chest pain lasts < 5 minutes AND [2] NO  chest pain or cardiac symptoms (e.g., breathing difficulty, sweating) [place], RN, Tye [place] 10/21/2020 7:37:54 AM PLEASE NOTE: All timestamps contained within this report are represented as [place] Standard Time. CONFIDENTIALTY NOTICE: This fax transmission is intended only for the addressee. It contains information that is legally privileged, confidential or otherwise protected from use or disclosure. If you are not the intended recipient, you are strictly prohibited from reviewing, disclosing, copying using or disseminating any of this information or taking any action in reliance on or regarding this information. If you have received this fax in error, please notify us immediately by telephone so that we can arrange for its return to [place]. Phone: [phone], Toll-Free: [phone], Fax: [phone] Page: 2 of 2 Call Id: [id number] Guidelines Guideline Title Affirmed Question Affirmed Notes Nurse Date/Time Eilene Ghazi Time) now (Exception: chest pains that last only a few seconds) Abdominal Pain - Upper [1] MILDMODERATE pain AND [2] constant AND [3] present > 2 hours Vallery Sa, RN, [facility] 10/21/2020 7:42:51 AM Disp. Time Eilene Ghazi Time) Disposition Final User 10/21/2020 7:42:15 AM See PCP within [place], RN, Tye [place] 10/21/2020 7:44:10 AM See HCP within 4 Hours (or PCP triage) Yes Vallery Sa, RN, [facility]y [place] Disagree/Comply Comply Caller Understands Yes PreDisposition Call Doctor Care Advice Given Per Guideline SEE PCP WITHIN 24 HOURS: * IF OFFICE WILL BE OPEN: You need to be examined within the next 24 hours. Call your doctor (or NP/PA) when the office opens and make an appointment. CALL BACK IF: * Difficulty breathing occurs * Chest pain increases in frequency, duration or severity * Chest pain lasts over 5 minutes * You become worse CARE ADVICE given per Chest Pain (Adult) guideline. SEE HCP (OR PCP TRIAGE) WITHIN 4 HOURS: * IF OFFICE WILL BE OPEN: You need to be seen within  the  next 3 or 4 hours. Call your doctor (or NP/PA) now or as soon as the office opens. TAKE AN ANTACID: * If having pain now, try taking an antacid (e.g., Mylanta, Maalox). * Dose: Take 2 tablespoons (30 ml) of liquid by mouth. CALL BACK IF: * You become worse CARE ADVICE given per Abdominal Pain, Upper (Adult) guideline. Comments User: Berton Mount, RN Date/Time Eilene Ghazi Time): 10/21/2020 7:39:13 AM During triage she shared that the pain started going into her chest yesterday (mild at this time). No severe breathing difficulty or blueness around her lips. Referrals REFERRED TO PCP OFFICE REFERRED TO PCP OFFICE

## 2020-10-21 NOTE — Telephone Encounter (Addendum)
Pt left v/m that she took access nurse advise and went to [facility] and the doctor at [facility] told pt he was not sure why pt was there and pt was advised to go to ED for testing; pt said her abd is size of bowling ball and hard when touch abd and pain level 6 out of 10 for 5 days. I spoke with pt; pt said her upper abd is swollen size of bowling ball. Pt said pain level is still the same. Pt said she can have BM but not her normal BM; pt had BM this morning and BM was not formed like usual but appeared the size of little worms and smaller amt than ususl. No blood, mucus or dark stools seen. Pt upper stomach feels heavy and a lot of pressure with sharp pains on and off. Pt said she appears to be pregnant but she is not pregnant. Pt said she feels very uncomfortable. Pt said the pain and pressure was consistent on 10/20/20 and today still having pain and pressure but not all the time. No N&V and no fever.pt said pharmacy advised to take miralax daily. Pt started miralax little while ago. Pt said she does not feel like she needs to go to ED but wants something for the swelling and pain. No available appts at [facility] until 10/26/20. Gentry Fitz NP said miralax is a good option and pt can be seen on 10/26/20 and if condition changes or worsens pt can go to ED. Pt voiced understanding and scheduled appt with Gentry Fitz NP on 10/26/20 at 9:40 with no covid symptoms or exposure known to pt and ED precautions given and pt voiced understanding.sending note to Gentry Fitz NP and Joellen CMA.

## 2020-10-21 NOTE — Telephone Encounter (Signed)
I spoke with pt; pt said the nurse she spoke with earlier advised her to go to [facility] and pt is on her way to [facility] now. Pt already has appt scheduled 11/12/20 with GI as new pt for constipation. Sending FYI to Gentry Fitz NP.

## 2020-10-21 NOTE — Telephone Encounter (Signed)
Okay, I'm sorry but I'm just now seeing this message.  Thanks.

## 2020-10-26 ENCOUNTER — Ambulatory Visit: Payer: Commercial Managed Care - PPO | Admitting: Primary Care

## 2020-11-08 ENCOUNTER — Other Ambulatory Visit: Payer: Self-pay

## 2020-11-12 ENCOUNTER — Other Ambulatory Visit: Payer: Self-pay

## 2020-11-12 ENCOUNTER — Encounter: Payer: Self-pay | Admitting: Gastroenterology

## 2020-11-12 ENCOUNTER — Ambulatory Visit: Payer: Commercial Managed Care - PPO | Admitting: Gastroenterology

## 2020-11-12 VITALS — BP 114/83 | HR 91 | Temp 97.8°F | Ht 65.5 in | Wt 225.5 lb

## 2020-11-12 DIAGNOSIS — R194 Change in bowel habit: Secondary | ICD-10-CM

## 2020-11-12 DIAGNOSIS — R198 Other specified symptoms and signs involving the digestive system and abdomen: Secondary | ICD-10-CM

## 2020-11-12 DIAGNOSIS — R140 Abdominal distension (gaseous): Secondary | ICD-10-CM

## 2020-11-12 MED ORDER — MAGNESIUM CITRATE PO SOLN: 1.0000 | Freq: Once | ORAL | 0 refills | Status: AC

## 2020-11-12 MED ORDER — NA SULFATE-K SULFATE-MG SULF 17.5-3.13-1.6 GM/177ML PO SOLN: 354.0000 mL | Freq: Once | ORAL | 0 refills | Status: AC

## 2020-11-12 NOTE — Progress Notes (Signed)
Cephas Darby, MD [address]  [place]  [place], [place] [address]  Main: [phone]  Fax: [phone]    Gastroenterology Consultation  Referring Provider:     Pleas Koch, NP Primary Care Physician:  Pleas Koch, NP Primary Gastroenterologist:  Dr. Cephas Darby Reason for Consultation:     Altered bowel habits, rectal discomfort, abdominal bloating        HPI:   Victoria Wagner is a 40 y.o. female referred by Dr. Carlis Abbott, Leticia Penna, NP  for consultation & management of change in bowel habits, rectal discomfort and abdominal bloating.  Patient reports that for last 6 months, she has been experiencing the above symptoms.  She has sensation of incomplete emptying although her stools are not hard.  She also reports abdominal discomfort associated with significant abdominal bloating early morning after she wakes up.  She does report intermittent sharp rectal pain with or without a bowel movement.  Her weight has been stable.  She denies any change in her diet.  She does drink ginger ale daily which she started for abdominal bloating.  She is on disability due to cervical spine fusion, mostly stays at home.  She denies any rectal bleeding.  Her labs are unremarkable including CBC, CMP, TSH.  She had H. pylori stool antigen test negative in 2019.  She has been taking smooth tea that has senna.  Patient does not smoke or drink alcohol She denies family history of GI malignancy  NSAIDs: None  Antiplts/Anticoagulants/Anti thrombotics: None  GI Procedures: None  Past Medical History:  Diagnosis Date   Anxiety    Brachial neuritis    Cataract    Post traumatic- per patient   Cervical radiculopathy    Cervical vertigo    Degeneration of cervical intervertebral disc    Depression    Dizziness    Headache    Joint pain    Muscle spasm    Myalgia    Myofascial pain    Myositis    Neuralgia    Neuritis    Occipital headache    Pain management     Postpartum care following cesarean delivery (07/31/12) 08/01/2012   S/P primary low transverse C-section (07/31/12) 08/01/2012   Swallowing difficulty    Weakness of both hands    Weakness of both legs     Past Surgical History:  Procedure Laterality Date   CERVICAL FUSION  04/2010   C3-C6   CESAREAN SECTION N/A 07/31/2012   Procedure: CESAREAN SECTION;  Surgeon: Lovenia Kim, MD;  Location: [facility];  Service: Obstetrics;  Laterality: N/A;   EDD: 08/18/12   ECTOPIC PREGNANCY SURGERY  04/2005    Current Outpatient Medications:    cyclobenzaprine (FLEXERIL) 10 MG tablet, Take 10 mg by mouth 3 (three) times daily. , Disp: , Rfl:    desonide (DESOWEN) 0.05 % ointment, desonide 0.05 % topical ointment, Disp: 15 g, Rfl: 0   diazepam (VALIUM) 5 MG tablet, Take by mouth., Disp: , Rfl:    indomethacin (INDOCIN SR) 75 MG CR capsule, Take 75 mg by mouth every 12 (twelve) hours., Disp: , Rfl:    magnesium citrate SOLN, Take 296 mLs (1 Bottle total) by mouth once for 1 dose., Disp: 195 mL, Rfl: 0   Na Sulfate-K Sulfate-Mg Sulf 17.5-3.13-1.6 GM/177ML SOLN, Take 354 mLs by mouth once for 1 dose., Disp: 354 mL, Rfl: 0   NUVARING 0.12-0.015 MG/24HR vaginal ring, Place 1 each vaginally every  28 (twenty-eight) days. , Disp: , Rfl: 11   OXTELLAR XR 300 MG TB24, Take 1 tablet by mouth in the morning, at noon, and at bedtime., Disp: , Rfl:    tacrolimus (PROTOPIC) 0.1 % ointment, Apply topically 2 (two) times daily., Disp: 30 g, Rfl: 0    No family history on file.   Social History   Tobacco Use   Smoking status: Never   Smokeless tobacco: Never  Substance Use Topics   Alcohol use: No    Alcohol/week: 0.0 standard drinks   Drug use: No    Allergies as of 11/12/2020   (No Known Allergies)    Review of Systems:    All systems reviewed and negative except where noted in HPI.   Physical Exam:  BP 114/83 (BP Location: Left Arm, Patient Position: Sitting, Cuff Size: Normal)   Pulse 91   Temp 97.8  F (36.6 C) (Oral)   Ht 5' 5.5" (1.664 m)   Wt 225 lb 8 oz (102.3 kg)   BMI 36.95 kg/m  No LMP recorded. (Menstrual status: Other).  General:   Alert,  Well-developed, well-nourished, pleasant and cooperative in NAD Head:  Normocephalic and atraumatic. Eyes:  Sclera clear, no icterus.   Conjunctiva pink. Ears:  Normal auditory acuity. Nose:  No deformity, discharge, or lesions. Mouth:  No deformity or lesions,oropharynx pink & moist. Neck:  Supple; no masses or thyromegaly. Lungs:  Respirations even and unlabored.  Clear throughout to auscultation.   No wheezes, crackles, or rhonchi. No acute distress. Heart:  Regular rate and rhythm; no murmurs, clicks, rubs, or gallops. Abdomen:  Normal bowel sounds. Soft, non-tender and markedly distended, tympanic without masses, hepatosplenomegaly or hernias noted.  No guarding or rebound tenderness.   Rectal: Normal perianal skin, nontender digital rectal exam, soft brown stool in the rectal vault Msk:  Symmetrical without gross deformities. Good, equal movement & strength bilaterally. Pulses:  Normal pulses noted. Extremities:  No clubbing or edema.  No cyanosis. Neurologic:  Alert and oriented x3;  grossly normal neurologically. Skin:  Intact without significant lesions or rashes. No jaundice. Psych:  Alert and cooperative. Normal mood and affect.  Imaging Studies: No abdominal imaging  Assessment and Plan:   Victoria Wagner is a 40 y.o. African-American female with BMI 36.9, chronic pain, on indomethacin, Flexeril, cervical spine fusion is seen in consultation for change in bowel habits, abdominal bloating, rectal pressure, incomplete emptying.  Patient may have slow colonic motility.  We will try Linzess 145 MCG, samples provided.  Given her age and ethnicity, recommend upper endoscopy as well as colonoscopy with TI evaluation Advised her to stop consumption of red meat, carbonated beverages, processed foods  Follow up in 3  months   Cephas Darby, MD

## 2020-11-12 NOTE — Patient Instructions (Signed)
High-Fiber Eating Plan °Fiber, also called dietary fiber, is a type of carbohydrate. It is found foods such as fruits, vegetables, whole grains, and beans. A high-fiber diet can have many health benefits. Your health care provider may recommend a high-fiber diet to help: °Prevent constipation. Fiber can make your bowel movements more regular. °Lower your cholesterol. °Relieve the following conditions: °Inflammation of veins in the anus (hemorrhoids). °Inflammation of specific areas of the digestive tract (uncomplicated diverticulosis). °A problem of the large intestine, also called the colon, that sometimes causes pain and diarrhea (irritable bowel syndrome, or IBS). °Prevent overeating as part of a weight-loss plan. °Prevent heart disease, type 2 diabetes, and certain cancers. °What are tips for following this plan? °Reading food labels ° °Check the nutrition facts label on food products for the amount of dietary fiber. Choose foods that have 5 grams of fiber or more per serving. °The goals for recommended daily fiber intake include: °Men (age 50 or younger): 34-38 g. °Men (over age 50): 28-34 g. °Women (age 50 or younger): 25-28 g. °Women (over age 50): 22-25 g. °Your daily fiber goal is _____________ g. °Shopping °Choose whole fruits and vegetables instead of processed forms, such as apple juice or applesauce. °Choose a wide variety of high-fiber foods such as avocados, lentils, oats, and kidney beans. °Read the nutrition facts label of the foods you choose. Be aware of foods with added fiber. These foods often have high sugar and sodium amounts per serving. °Cooking °Use whole-grain flour for baking and cooking. °Cook with brown rice instead of white rice. °Meal planning °Start the day with a breakfast that is high in fiber, such as a cereal that contains 5 g of fiber or more per serving. °Eat breads and cereals that are made with whole-grain flour instead of refined flour or white flour. °Eat brown rice, bulgur  wheat, or millet instead of white rice. °Use beans in place of meat in soups, salads, and pasta dishes. °Be sure that half of the grains you eat each day are whole grains. °General information °You can get the recommended daily intake of dietary fiber by: °Eating a variety of fruits, vegetables, grains, nuts, and beans. °Taking a fiber supplement if you are not able to take in enough fiber in your diet. It is better to get fiber through food than from a supplement. °Gradually increase how much fiber you consume. If you increase your intake of dietary fiber too quickly, you may have bloating, cramping, or gas. °Drink plenty of water to help you digest fiber. °Choose high-fiber snacks, such as berries, raw vegetables, nuts, and popcorn. °What foods should I eat? °Fruits °Berries. Pears. Apples. Oranges. Avocado. Prunes and raisins. Dried figs. °Vegetables °Sweet potatoes. Spinach. Kale. Artichokes. Cabbage. Broccoli. Cauliflower. Green peas. Carrots. Squash. °Grains °Whole-grain breads. Multigrain cereal. Oats and oatmeal. Brown rice. Barley. Bulgur wheat. Millet. Quinoa. Bran muffins. Popcorn. Rye wafer crackers. °Meats and other proteins °Navy beans, kidney beans, and pinto beans. Soybeans. Split peas. Lentils. Nuts and seeds. °Dairy °Fiber-fortified yogurt. °Beverages °Fiber-fortified soy milk. Fiber-fortified orange juice. °Other foods °Fiber bars. °The items listed above may not be a complete list of recommended foods and beverages. Contact a dietitian for more information. °What foods should I avoid? °Fruits °Fruit juice. Cooked, strained fruit. °Vegetables °Fried potatoes. Canned vegetables. Well-cooked vegetables. °Grains °White bread. Pasta made with refined flour. White rice. °Meats and other proteins °Fatty cuts of meat. Fried chicken or fried fish. °Dairy °Milk. Yogurt. Cream cheese. Sour cream. °Fats and   oils °Butters. °Beverages °Soft drinks. °Other foods °Cakes and pastries. °The items listed above may  not be a complete list of foods and beverages to avoid. Talk with your dietitian about what choices are [place] for you. °Summary °Fiber is a type of carbohydrate. It is found in foods such as fruits, vegetables, whole grains, and beans. °A high-fiber diet has many benefits. It can help to prevent constipation, lower blood cholesterol, aid weight loss, and reduce your risk of heart disease, diabetes, and certain cancers. °Increase your intake of fiber gradually. Increasing fiber too quickly may cause cramping, bloating, and gas. Drink plenty of water while you increase the amount of fiber you consume. °The [place] sources of fiber include whole fruits and vegetables, whole grains, nuts, seeds, and beans. °This information is not intended to replace advice given to you by your health care provider. Make sure you discuss any questions you have with your health care provider. °Document Revised: 08/14/2019 Document Reviewed: 08/14/2019 °Elsevier Patient Education © 2022 Elsevier Inc. ° °

## 2020-11-22 ENCOUNTER — Telehealth: Payer: Self-pay | Admitting: Gastroenterology

## 2020-11-22 NOTE — Telephone Encounter (Signed)
Patient called and did not like the Linzess samples, as it made her stomach hurt. What else can she take?

## 2020-11-23 MED ORDER — LUBIPROSTONE 24 MCG PO CAPS: 24.0000 ug | ORAL_CAPSULE | Freq: Two times a day (BID) | ORAL | 1 refills | Status: DC

## 2020-11-23 NOTE — Telephone Encounter (Signed)
Patient verbalized understanding of instructions  

## 2020-11-23 NOTE — Telephone Encounter (Signed)
Patient was given  samples of linzess 145 mcg

## 2020-12-07 ENCOUNTER — Other Ambulatory Visit: Payer: Self-pay | Admitting: Gastroenterology

## 2020-12-10 ENCOUNTER — Encounter: Payer: Self-pay | Admitting: Gastroenterology

## 2020-12-13 ENCOUNTER — Ambulatory Visit
Admission: RE | Admit: 2020-12-13 | Discharge: 2020-12-13 | Disposition: A | Discharge status: Home / Self Care | Payer: Commercial Managed Care - PPO | Attending: Gastroenterology | Admitting: Gastroenterology

## 2020-12-13 ENCOUNTER — Ambulatory Visit: Payer: Commercial Managed Care - PPO | Admitting: Registered Nurse

## 2020-12-13 ENCOUNTER — Encounter
Admission: RE | Disposition: A | Discharge status: Home / Self Care | Payer: Self-pay | Source: Home / Self Care | Attending: Gastroenterology

## 2020-12-13 DIAGNOSIS — K635 Polyp of colon: Secondary | ICD-10-CM | POA: Diagnosis not present

## 2020-12-13 DIAGNOSIS — K2950 Unspecified chronic gastritis without bleeding: Secondary | ICD-10-CM | POA: Insufficient documentation

## 2020-12-13 DIAGNOSIS — Z793 Long term (current) use of hormonal contraceptives: Secondary | ICD-10-CM | POA: Diagnosis not present

## 2020-12-13 DIAGNOSIS — R140 Abdominal distension (gaseous): Secondary | ICD-10-CM | POA: Diagnosis not present

## 2020-12-13 DIAGNOSIS — K5730 Diverticulosis of large intestine without perforation or abscess without bleeding: Secondary | ICD-10-CM | POA: Insufficient documentation

## 2020-12-13 DIAGNOSIS — Z79899 Other long term (current) drug therapy: Secondary | ICD-10-CM | POA: Insufficient documentation

## 2020-12-13 DIAGNOSIS — R194 Change in bowel habit: Secondary | ICD-10-CM

## 2020-12-13 DIAGNOSIS — D122 Benign neoplasm of ascending colon: Secondary | ICD-10-CM | POA: Diagnosis not present

## 2020-12-13 HISTORY — PX: ESOPHAGOGASTRODUODENOSCOPY (EGD) WITH PROPOFOL: SHX5813

## 2020-12-13 HISTORY — PX: COLONOSCOPY WITH PROPOFOL: SHX5780

## 2020-12-13 LAB — POCT PREGNANCY, URINE: Preg Test, Ur: NEGATIVE

## 2020-12-13 SURGERY — COLONOSCOPY WITH PROPOFOL
Anesthesia: General

## 2020-12-13 MED ORDER — DEXMEDETOMIDINE HCL IN NACL 200 MCG/50ML IV SOLN
INTRAVENOUS | Status: DC | PRN
  Administered 2020-12-13: 20 ug via INTRAVENOUS

## 2020-12-13 MED ORDER — PROPOFOL 10 MG/ML IV BOLUS
INTRAVENOUS | Status: DC | PRN
  Administered 2020-12-13: 100 mg via INTRAVENOUS

## 2020-12-13 MED ORDER — PROPOFOL 500 MG/50ML IV EMUL
INTRAVENOUS | Status: DC | PRN
  Administered 2020-12-13: 175 ug/kg/min via INTRAVENOUS

## 2020-12-13 MED ORDER — LIDOCAINE HCL (PF) 2 % IJ SOLN
INTRAMUSCULAR | Status: AC
  Filled 2020-12-13: qty 20

## 2020-12-13 MED ORDER — LIDOCAINE HCL (CARDIAC) PF 100 MG/5ML IV SOSY
PREFILLED_SYRINGE | INTRAVENOUS | Status: DC | PRN
  Administered 2020-12-13: 100 mg via INTRAVENOUS

## 2020-12-13 MED ORDER — PHENYLEPHRINE HCL (PRESSORS) 10 MG/ML IV SOLN
INTRAVENOUS | Status: AC
  Filled 2020-12-13: qty 1

## 2020-12-13 MED ORDER — PROPOFOL 500 MG/50ML IV EMUL
INTRAVENOUS | Status: AC
  Filled 2020-12-13: qty 350

## 2020-12-13 MED ORDER — LIDOCAINE HCL (PF) 2 % IJ SOLN
INTRAMUSCULAR | Status: AC
  Filled 2020-12-13: qty 10

## 2020-12-13 MED ORDER — SODIUM CHLORIDE 0.9 % IV SOLN
INTRAVENOUS | Status: DC
  Administered 2020-12-13: 20 mL/h via INTRAVENOUS

## 2020-12-13 MED ORDER — PROPOFOL 10 MG/ML IV BOLUS
INTRAVENOUS | Status: AC
  Filled 2020-12-13: qty 20

## 2020-12-13 NOTE — Anesthesia Postprocedure Evaluation (Signed)
Anesthesia Post Note  Patient: [name]  Procedure(s) Performed: COLONOSCOPY WITH PROPOFOL ESOPHAGOGASTRODUODENOSCOPY (EGD) WITH PROPOFOL  Patient location during evaluation: Endoscopy Anesthesia Type: General Level of consciousness: awake and alert Pain management: pain level controlled Vital Signs Assessment: post-procedure vital signs reviewed and stable Respiratory status: spontaneous breathing, nonlabored ventilation, respiratory function stable and patient connected to nasal cannula oxygen Cardiovascular status: blood pressure returned to baseline and stable Postop Assessment: no apparent nausea or vomiting Anesthetic complications: no   No notable events documented.   Last Vitals:  Vitals:   12/13/20 0707 12/13/20 0857  BP: (!) 140/103 138/71  Pulse: (!) 102 99  Resp: 20 (!) 23  Temp: (!) 36.1 C 36.5 C  SpO2: 99% 98%    Last Pain:  Vitals:   12/13/20 0917  TempSrc:   PainSc: 0-No pain                 Arita Miss

## 2020-12-13 NOTE — H&P (Signed)
Cephas Darby, MD [address]  [place]  [place], [place] [address]  Main: [phone]  Fax: [phone] Pager: [phone]  Primary Care Physician:  Pleas Koch, NP Primary Gastroenterologist:  Dr. Cephas Darby  Pre-Procedure History & Physical: HPI:  Victoria Wagner is a 40 y.o. female is here for an endoscopy and colonoscopy.   Past Medical History:  Diagnosis Date   Anxiety    Brachial neuritis    Cataract    Post traumatic- per patient   Cervical radiculopathy    Cervical vertigo    Degeneration of cervical intervertebral disc    Depression    Dizziness    Headache    Joint pain    Muscle spasm    Myalgia    Myofascial pain    Myositis    Neuralgia    Neuritis    Occipital headache    Pain management    Postpartum care following cesarean delivery (07/31/12) 08/01/2012   S/P primary low transverse C-section (07/31/12) 08/01/2012   Swallowing difficulty    Weakness of both hands    Weakness of both legs     Past Surgical History:  Procedure Laterality Date   CERVICAL FUSION  04/2010   C3-C6   CESAREAN SECTION N/A 07/31/2012   Procedure: CESAREAN SECTION;  Surgeon: Lovenia Kim, MD;  Location: [facility];  Service: Obstetrics;  Laterality: N/A;   EDD: 08/18/12   ECTOPIC PREGNANCY SURGERY  04/2005    Prior to Admission medications   Medication Sig Start Date End Date Taking? Authorizing Provider  cyclobenzaprine (FLEXERIL) 10 MG tablet Take 10 mg by mouth 3 (three) times daily.  04/29/10  Yes [provider]  desonide (DESOWEN) 0.05 % ointment desonide 0.05 % topical ointment 10/15/20  Yes Pleas Koch, NP  diazepam (VALIUM) 5 MG tablet Take by mouth. 06/01/20  Yes [provider]  indomethacin (INDOCIN SR) 75 MG CR capsule Take 75 mg by mouth every 12 (twelve) hours. 06/23/20  Yes [provider]  lubiprostone (AMITIZA) 24 MCG capsule TAKE 1 CAPSULE (24 MCG TOTAL) BY MOUTH 2 (TWO) TIMES DAILY WITH A MEAL. 12/07/20  Yes  Dwan Hemmelgarn, Tally Due, MD  NUVARING 0.12-0.015 MG/24HR vaginal ring Place 1 each vaginally every 28 (twenty-eight) days.  07/20/14  Yes [provider]  OXTELLAR XR 300 MG TB24 Take 1 tablet by mouth in the morning, at noon, and at bedtime. 11/11/19  Yes [provider]  tacrolimus (PROTOPIC) 0.1 % ointment Apply topically 2 (two) times daily. 10/15/20  Yes Pleas Koch, NP    Allergies as of 11/12/2020   (No Known Allergies)    No family history on file.  Social History   Socioeconomic History   Marital status: Married    Spouse name: Antyuan    Number of children: 1   Years of education: BA   Highest education level: Not on file  Occupational History   Occupation: Unemployed,disability  Tobacco Use   Smoking status: Never   Smokeless tobacco: Never  Vaping Use   Vaping Use: Never used  Substance and Sexual Activity   Alcohol use: No    Alcohol/week: 0.0 standard drinks   Drug use: No   Sexual activity: Not on file  Other Topics Concern   Not on file  Social History Narrative   Married   Right handed.   One child, daughter.   Caffeine use: Drinks green tea (Drinks 2-3 times per day)   Enjoys  relaxing, spending time with her husband.   Social Determinants of Health   Financial Resource Strain: Not on file  Food Insecurity: Not on file  Transportation Needs: Not on file  Physical Activity: Not on file  Stress: Not on file  Social Connections: Not on file  Intimate Partner Violence: Not on file    Review of Systems: See HPI, otherwise negative ROS  Physical Exam: BP (!) 140/103   Pulse (!) 102   Temp (!) 96.9 F (36.1 C) (Skin)   Resp 20   Ht '5\' 6"'$  (1.676 m)   Wt 99.8 kg   SpO2 99%   BMI 35.51 kg/m  General:   Alert,  pleasant and cooperative in NAD Head:  Normocephalic and atraumatic. Neck:  Supple; no masses or thyromegaly. Lungs:  Clear throughout to auscultation.    Heart:  Regular rate and rhythm. Abdomen:  Soft, nontender  and nondistended. Normal bowel sounds, without guarding, and without rebound.   Neurologic:  Alert and  oriented x4;  grossly normal neurologically.  Impression/Plan: Victoria Wagner is here for an endoscopy and colonoscopy to be performed for altered bowel habits, abdominal bloating  Risks, benefits, limitations, and alternatives regarding  endoscopy and colonoscopy have been reviewed with the patient.  Questions have been answered.  All parties agreeable.   Sherri Sear, MD  12/13/2020, 7:46 AM

## 2020-12-13 NOTE — Transfer of Care (Signed)
Immediate Anesthesia Transfer of Care Note  Patient: Victoria Wagner  Procedure(s) Performed: COLONOSCOPY WITH PROPOFOL ESOPHAGOGASTRODUODENOSCOPY (EGD) WITH PROPOFOL  Patient Location: Endoscopy Unit  Anesthesia Type:General  Level of Consciousness: drowsy  Airway & Oxygen Therapy: Patient Spontanous Breathing  Post-op Assessment: Report given to RN and Post -op Vital signs reviewed and stable  Post vital signs: Reviewed and stable  Last Vitals:  Vitals Value Taken Time  BP    Temp    Pulse 100 12/13/20 0857  Resp 25 12/13/20 0857  SpO2 97 % 12/13/20 0857  Vitals shown include unvalidated device data.  Last Pain:  Vitals:   12/13/20 0707  TempSrc: Skin  PainSc: 4          Complications: No notable events documented.

## 2020-12-13 NOTE — Op Note (Signed)
[facility] Gastroenterology Patient Name: Victoria Wagner Procedure Date: 12/13/2020 7:02 AM MRN: [id number] Account #: [id number] Date of Birth: May 15, 1980 Admit Type: Outpatient Age: 40 Room: [facility] ENDO ROOM 1 Gender: Female Note Status: Finalized Procedure:             Colonoscopy Indications:           This is the patient's first colonoscopy, Change in                         bowel habits Providers:             Lin Landsman MD, MD Referring MD:          Pleas Koch (Referring MD) Medicines:             General Anesthesia Complications:         No immediate complications. Estimated blood loss: None. Procedure:             Pre-Anesthesia Assessment:                        - Prior to the procedure, a History and Physical was                         performed, and patient medications and allergies were                         reviewed. The patient is competent. The risks and                         benefits of the procedure and the sedation options and                         risks were discussed with the patient. All questions                         were answered and informed consent was obtained.                         Patient identification and proposed procedure were                         verified by the physician, the nurse, the                         anesthesiologist, the anesthetist and the technician                         in the pre-procedure area in the procedure room in the                         endoscopy suite. Mental Status Examination: alert and                         oriented. Airway Examination: normal oropharyngeal                         airway and neck mobility. Respiratory Examination:  clear to auscultation. CV Examination: normal.                         Prophylactic Antibiotics: The patient does not require                         prophylactic antibiotics. Prior Anticoagulants: The                          patient has taken no previous anticoagulant or                         antiplatelet agents. ASA Grade Assessment: II - A                         patient with mild systemic disease. After reviewing                         the risks and benefits, the patient was deemed in                         satisfactory condition to undergo the procedure. The                         anesthesia plan was to use general anesthesia.                         Immediately prior to administration of medications,                         the patient was re-assessed for adequacy to receive                         sedatives. The heart rate, respiratory rate, oxygen                         saturations, blood pressure, adequacy of pulmonary                         ventilation, and response to care were monitored                         throughout the procedure. The physical status of the                         patient was re-assessed after the procedure.                        After obtaining informed consent, the colonoscope was                         passed under direct vision. Throughout the procedure,                         the patient's blood pressure, pulse, and oxygen                         saturations were monitored continuously. The  Colonoscope was introduced through the anus and                         advanced to the the terminal ileum, with                         identification of the appendiceal orifice and IC                         valve. The colonoscopy was performed without                         difficulty. The patient tolerated the procedure well.                         The quality of the bowel preparation was evaluated                         using the BBPS [facility] Bowel Preparation Scale) with                         scores of: Right Colon = 3, Transverse Colon = 3 and                         Left Colon = 3 (entire mucosa seen well with no                          residual staining, small fragments of stool or opaque                         liquid). The total BBPS score equals 9. Findings:      The perianal and digital rectal examinations were normal. Pertinent       negatives include normal sphincter tone and no palpable rectal lesions.      A 5 mm polyp was found in the ascending colon. The polyp was sessile.       The polyp was removed with a cold snare. Resection and retrieval were       complete.      A few diverticula were found in the sigmoid colon.      The retroflexed view of the distal rectum and anal verge was normal and       showed no anal or rectal abnormalities.      The terminal ileum appeared normal. Impression:            - One 5 mm polyp in the ascending colon, removed with                         a cold snare. Resected and retrieved.                        - Diverticulosis in the sigmoid colon.                        - The distal rectum and anal verge are normal on                         retroflexion view.                        -  The examined portion of the ileum was normal. Recommendation:        - Discharge patient to home (with escort).                        - High fiber diet.                        - Continue present medications.                        - Await pathology results.                        - Repeat colonoscopy at age 1. Procedure Code(s):     --- Professional ---                        [phone], Colonoscopy, flexible; with removal of                         tumor(s), polyp(s), or other lesion(s) by snare                         technique Diagnosis Code(s):     --- Professional ---                        K63.5, Polyp of colon                        R19.4, Change in bowel habit                        K57.30, Diverticulosis of large intestine without                         perforation or abscess without bleeding CPT copyright 2019 American Medical Association. All rights reserved. The codes documented in this  report are preliminary and upon coder review may  be revised to meet current compliance requirements. Dr. Ulyess Mort Lin Landsman MD, MD 12/13/2020 8:56:09 AM This report has been signed electronically. Number of Addenda: 0 Note Initiated On: 12/13/2020 7:02 AM Scope Withdrawal Time: 0 hours 9 minutes 46 seconds  Total Procedure Duration: 0 hours 11 minutes 19 seconds  Estimated Blood Loss:  Estimated blood loss: none.      [facility]

## 2020-12-13 NOTE — Op Note (Signed)
[facility] Gastroenterology Patient Name: Victoria Wagner Procedure Date: 12/13/2020 7:04 AM MRN: [id number] Account #: [id number] Date of Birth: [birth year] Admit Type: Outpatient Age: 40 Room: [facility] ENDO ROOM 1 Gender: Female Note Status: Finalized Procedure:             Upper GI endoscopy Indications:           Previously treated for Helicobacter pylori, Abdominal                         distention Providers:             Lin Landsman MD, MD Referring MD:          Pleas Koch (Referring MD) Medicines:             General Anesthesia Complications:         No immediate complications. Estimated blood loss: None. Procedure:             Pre-Anesthesia Assessment:                        - Prior to the procedure, a History and Physical was                         performed, and patient medications and allergies were                         reviewed. The patient is competent. The risks and                         benefits of the procedure and the sedation options and                         risks were discussed with the patient. All questions                         were answered and informed consent was obtained.                         Patient identification and proposed procedure were                         verified by the physician, the nurse, the                         anesthesiologist, the anesthetist and the technician                         in the pre-procedure area in the procedure room in the                         endoscopy suite. Mental Status Examination: alert and                         oriented. Airway Examination: normal oropharyngeal                         airway and neck mobility. Respiratory Examination:  clear to auscultation. CV Examination: normal.                         Prophylactic Antibiotics: The patient does not require                         prophylactic antibiotics. Prior Anticoagulants: The                          patient has taken no previous anticoagulant or                         antiplatelet agents. ASA Grade Assessment: II - A                         patient with mild systemic disease. After reviewing                         the risks and benefits, the patient was deemed in                         satisfactory condition to undergo the procedure. The                         anesthesia plan was to use general anesthesia.                         Immediately prior to administration of medications,                         the patient was re-assessed for adequacy to receive                         sedatives. The heart rate, respiratory rate, oxygen                         saturations, blood pressure, adequacy of pulmonary                         ventilation, and response to care were monitored                         throughout the procedure. The physical status of the                         patient was re-assessed after the procedure.                        After obtaining informed consent, the endoscope was                         passed under direct vision. Throughout the procedure,                         the patient's blood pressure, pulse, and oxygen                         saturations were monitored continuously. The Endoscope  was introduced through the mouth, and advanced to the                         second part of duodenum. The upper GI endoscopy was                         accomplished without difficulty. The patient tolerated                         the procedure well. Findings:      The esophagus, gastroesophageal junction and examined esophagus were       normal.      The stomach was normal. Biopsies were taken with a cold forceps for       Helicobacter pylori testing.      The examined duodenum was normal.      The cardia and gastric fundus were normal on retroflexion. Impression:            - Normal esophagus and gastroesophageal junction.                         - Normal stomach. Biopsied.                        - Normal examined duodenum. Recommendation:        - Await pathology results.                        - Proceed with colonoscopy as scheduled                        See colonoscopy report Procedure Code(s):     --- Professional ---                        [id number], Esophagogastroduodenoscopy, flexible,                         transoral; with biopsy, single or multiple Diagnosis Code(s):     --- Professional ---                        R14.0, Abdominal distension (gaseous) CPT copyright 2019 American Medical Association. All rights reserved. The codes documented in this report are preliminary and upon coder review may  be revised to meet current compliance requirements. Dr. Ulyess Mort Lin Landsman MD, MD 12/13/2020 8:39:19 AM This report has been signed electronically. Number of Addenda: 0 Note Initiated On: 12/13/2020 7:04 AM Estimated Blood Loss:  Estimated blood loss: none.      [facility]

## 2020-12-13 NOTE — Anesthesia Preprocedure Evaluation (Signed)
Anesthesia Evaluation  Patient identified by MRN, date of birth, ID band Patient awake    Reviewed: Allergy & Precautions, NPO status , Patient's Chart, lab work & pertinent test results  History of Anesthesia Complications Negative for: history of anesthetic complications  Airway Mallampati: II  TM Distance: >3 FB Neck ROM: Full    Dental no notable dental hx. (+) Teeth Intact   Pulmonary neg pulmonary ROS, neg sleep apnea, neg COPD, Patient abstained from smoking.Not current smoker,    Pulmonary exam normal breath sounds clear to auscultation       Cardiovascular Exercise Tolerance: Good METS(-) hypertension(-) CAD and (-) Past MI negative cardio ROS  (-) dysrhythmias  Rhythm:Regular Rate:Normal - Systolic murmurs    Neuro/Psych  Headaches, PSYCHIATRIC DISORDERS Anxiety Depression  Neuromuscular disease    GI/Hepatic neg GERD  ,(+)     (-) substance abuse  ,   Endo/Other  neg diabetes  Renal/GU negative Renal ROS     Musculoskeletal   Abdominal   Peds  Hematology   Anesthesia Other Findings Past Medical History: No date: Anxiety No date: Brachial neuritis No date: Cataract     Comment:  Post traumatic- per patient No date: Cervical radiculopathy No date: Cervical vertigo No date: Degeneration of cervical intervertebral disc No date: Depression No date: Dizziness No date: Headache No date: Joint pain No date: Muscle spasm No date: Myalgia No date: Myofascial pain No date: Myositis No date: Neuralgia No date: Neuritis No date: Occipital headache No date: Pain management 08/01/2012: Postpartum care following cesarean delivery (07/31/12) 08/01/2012: S/P primary low transverse C-section (07/31/12) No date: Swallowing difficulty No date: Weakness of both hands No date: Weakness of both legs  Reproductive/Obstetrics                             Anesthesia Physical Anesthesia  Plan  ASA: 2  Anesthesia Plan: General   Post-op Pain Management:    Induction: Intravenous  PONV Risk Score and Plan: 3 and Ondansetron, Propofol infusion and TIVA  Airway Management Planned: Nasal Cannula  Additional Equipment: None  Intra-op Plan:   Post-operative Plan:   Informed Consent: I have reviewed the patients History and Physical, chart, labs and discussed the procedure including the risks, benefits and alternatives for the proposed anesthesia with the patient or authorized representative who has indicated his/her understanding and acceptance.     Dental advisory given  Plan Discussed with: CRNA and Surgeon  Anesthesia Plan Comments: (Discussed risks of anesthesia with patient, including possibility of difficulty with spontaneous ventilation under anesthesia necessitating airway intervention, PONV, and rare risks such as cardiac or respiratory or neurological events, and allergic reactions. Patient understands.  Pending pregnancy test.)        Anesthesia Quick Evaluation

## 2020-12-14 ENCOUNTER — Encounter: Payer: Self-pay | Admitting: Gastroenterology

## 2020-12-14 LAB — SURGICAL PATHOLOGY

## 2020-12-16 ENCOUNTER — Encounter: Payer: Self-pay | Admitting: Gastroenterology

## 2021-01-18 ENCOUNTER — Ambulatory Visit
Admission: RE | Admit: 2021-01-18 | Discharge: 2021-01-18 | Disposition: A | Discharge status: Home / Self Care | Payer: Commercial Managed Care - PPO | Source: Ambulatory Visit | Attending: Physical Medicine and Rehabilitation | Admitting: Physical Medicine and Rehabilitation

## 2021-01-18 ENCOUNTER — Other Ambulatory Visit: Payer: Self-pay

## 2021-01-18 ENCOUNTER — Other Ambulatory Visit: Payer: Self-pay | Admitting: Physical Medicine and Rehabilitation

## 2021-01-30 ENCOUNTER — Other Ambulatory Visit: Payer: Self-pay | Admitting: Primary Care

## 2021-01-30 DIAGNOSIS — R21 Rash and other nonspecific skin eruption: Secondary | ICD-10-CM

## 2021-01-30 NOTE — Telephone Encounter (Signed)
She should be seeing dermatology now. Is she? If so then have her send refill request to dermatologist.

## 2021-01-31 NOTE — Telephone Encounter (Signed)
Called patient did not request refill. Have made cpe appointment at patient request.

## 2021-02-18 ENCOUNTER — Other Ambulatory Visit: Payer: Self-pay

## 2021-02-18 ENCOUNTER — Encounter: Payer: Self-pay | Admitting: Gastroenterology

## 2021-02-18 ENCOUNTER — Ambulatory Visit: Payer: Commercial Managed Care - PPO | Admitting: Gastroenterology

## 2021-02-18 VITALS — BP 138/94 | HR 94 | Temp 98.4°F | Ht 65.0 in | Wt 231.4 lb

## 2021-02-18 DIAGNOSIS — R140 Abdominal distension (gaseous): Secondary | ICD-10-CM | POA: Diagnosis not present

## 2021-02-18 NOTE — Progress Notes (Signed)
Cephas Darby, MD [address]  [place]  [place], [place] [address]  Main: [phone]  Fax: [phone]    Gastroenterology Consultation  Referring Provider:     Pleas Koch, NP Primary Care Physician:  Pleas Koch, NP Primary Gastroenterologist:  Dr. Cephas Darby Reason for Consultation:     Altered bowel habits, rectal discomfort, abdominal bloating        HPI:   Victoria Wagner is a 40 y.o. female referred by Dr. Carlis Abbott, Leticia Penna, NP  for consultation & management of change in bowel habits, rectal discomfort and abdominal bloating.  Patient reports that for last 6 months, she has been experiencing the above symptoms.  She has sensation of incomplete emptying although her stools are not hard.  She also reports abdominal discomfort associated with significant abdominal bloating early morning after she wakes up.  She does report intermittent sharp rectal pain with or without a bowel movement.  Her weight has been stable.  She denies any change in her diet.  She does drink ginger ale daily which she started for abdominal bloating.  She is on disability due to cervical spine fusion, mostly stays at home.  She denies any rectal bleeding.  Her labs are unremarkable including CBC, CMP, TSH.  She had H. pylori stool antigen test negative in 2019.  She has been taking smooth tea that has senna.  Follow-up visit 02/18/2021 Patient is here to discuss about abdominal bloating.  She underwent upper endoscopy and colonoscopy, including biopsies were unremarkable.  Patient reports that she had regular bowel movements after the colonoscopy until 2 weeks ago.  She is now experiencing severe abdominal bloating, incomplete emptying.  She has bowel movement every other day and soft.  She tried Linzess 145 MCG samples that resulted in severe pain.  Patient is gaining weight.  She is a smooth tea is also not helping.  Patient does not smoke or drink alcohol She denies family  history of GI malignancy  NSAIDs: None  Antiplts/Anticoagulants/Anti thrombotics: None  GI Procedures:  EGD and colonoscopy 12/13/2020 - Normal esophagus and gastroesophageal junction. - Normal stomach. Biopsied. - Normal examined duodenum.  - One 5 mm polyp in the ascending colon, removed with a cold snare. Resected and retrieved. - Diverticulosis in the sigmoid colon. - The distal rectum and anal verge are normal on retroflexion view. - The examined portion of the ileum was normal.  DIAGNOSIS:  A. STOMACH, RANDOM; COLD BIOPSY:  - MILD CHRONIC GASTRITIS.  - NEGATIVE FOR ACTIVE INFLAMMATION AND H PYLORI.  - NEGATIVE FOR INTESTINAL METAPLASIA, DYSPLASIA, AND MALIGNANCY.   B. COLON POLYP, ASCENDING; COLD SNARE:  - SESSILE SERRATED POLYP.  - NEGATIVE FOR DYSPLASIA AND MALIGNANCY.     Past Medical History:  Diagnosis Date   Anxiety    Brachial neuritis    Cataract    Post traumatic- per patient   Cervical radiculopathy    Cervical vertigo    Degeneration of cervical intervertebral disc    Depression    Dizziness    Headache    Joint pain    Muscle spasm    Myalgia    Myofascial pain    Myositis    Neuralgia    Neuritis    Occipital headache    Pain management    Postpartum care following cesarean delivery (07/31/12) 08/01/2012   S/P primary low transverse C-section (07/31/12) 08/01/2012   Swallowing difficulty    Weakness of both hands  Weakness of both legs     Past Surgical History:  Procedure Laterality Date   CERVICAL FUSION  04/2010   C3-C6   CESAREAN SECTION N/A 07/31/2012   Procedure: CESAREAN SECTION;  Surgeon: Lovenia Kim, MD;  Location: [place] [facility];  Service: Obstetrics;  Laterality: N/A;   EDD: 08/18/12   COLONOSCOPY WITH PROPOFOL N/A 12/13/2020   Procedure: COLONOSCOPY WITH PROPOFOL;  Surgeon: Lin Landsman, MD;  Location: [facility] ENDOSCOPY;  Service: Gastroenterology;  Laterality: N/A;   ECTOPIC PREGNANCY SURGERY  04/2005    ESOPHAGOGASTRODUODENOSCOPY (EGD) WITH PROPOFOL N/A 12/13/2020   Procedure: ESOPHAGOGASTRODUODENOSCOPY (EGD) WITH PROPOFOL;  Surgeon: Lin Landsman, MD;  Location: [place];  Service: Gastroenterology;  Laterality: N/A;    Current Outpatient Medications:    cyclobenzaprine (FLEXERIL) 10 MG tablet, Take 10 mg by mouth 3 (three) times daily. , Disp: , Rfl:    desonide (DESOWEN) 0.05 % ointment, desonide 0.05 % topical ointment, Disp: 15 g, Rfl: 0   indomethacin (INDOCIN SR) 75 MG CR capsule, Take 75 mg by mouth every 12 (twelve) hours., Disp: , Rfl:    lubiprostone (AMITIZA) 24 MCG capsule, TAKE 1 CAPSULE (24 MCG TOTAL) BY MOUTH 2 (TWO) TIMES DAILY WITH A MEAL., Disp: 180 capsule, Rfl: 0   NUVARING 0.12-0.015 MG/24HR vaginal ring, Place 1 each vaginally every 28 (twenty-eight) days. , Disp: , Rfl: 11   OXTELLAR XR 300 MG TB24, Take 1 tablet by mouth in the morning, at noon, and at bedtime., Disp: , Rfl:    tacrolimus (PROTOPIC) 0.1 % ointment, Apply topically 2 (two) times daily., Disp: 30 g, Rfl: 0   diazepam (VALIUM) 5 MG tablet, Take by mouth. (Patient not taking: Reported on 02/18/2021), Disp: , Rfl:     History reviewed. No pertinent family history.   Social History   Tobacco Use   Smoking status: Never   Smokeless tobacco: Never  Vaping Use   Vaping Use: Never used  Substance Use Topics   Alcohol use: No    Alcohol/week: 0.0 standard drinks   Drug use: No    Allergies as of 02/18/2021   (Not on File)    Review of Systems:    All systems reviewed and negative except where noted in HPI.   Physical Exam:  BP (!) 138/94 (BP Location: Right Arm, Patient Position: Sitting, Cuff Size: Large)   Pulse 94   Temp 98.4 F (36.9 C) (Temporal)   Ht 5\' 5"  (1.651 m)   Wt 231 lb 6.4 oz (105 kg)   BMI 38.51 kg/m  No LMP recorded. (Menstrual status: Other).  General:   Alert,  Well-developed, well-nourished, pleasant and cooperative in NAD Head:  Normocephalic and  atraumatic. Eyes:  Sclera clear, no icterus.   Conjunctiva pink. Ears:  Normal auditory acuity. Nose:  No deformity, discharge, or lesions. Mouth:  No deformity or lesions,oropharynx pink & moist. Neck:  Supple; no masses or thyromegaly. Lungs:  Respirations even and unlabored.  Clear throughout to auscultation.   No wheezes, crackles, or rhonchi. No acute distress. Heart:  Regular rate and rhythm; no murmurs, clicks, rubs, or gallops. Abdomen:  Normal bowel sounds. Soft, non-tender and markedly distended, tympanic without masses, hepatosplenomegaly or hernias noted.  No guarding or rebound tenderness.   Rectal: Normal perianal skin, nontender digital rectal exam, soft brown stool in the rectal vault Msk:  Symmetrical without gross deformities. Good, equal movement & strength bilaterally. Pulses:  Normal pulses noted. Extremities:  No clubbing or edema.  No  cyanosis. Neurologic:  Alert and oriented x3;  grossly normal neurologically. Skin:  Intact without significant lesions or rashes. No jaundice. Psych:  Alert and cooperative. Normal mood and affect.  Imaging Studies: No abdominal imaging  Assessment and Plan:   Victoria Wagner is a 40 y.o. African-American female with BMI 36.9, chronic pain, on indomethacin, Flexeril, cervical spine fusion is seen in consultation for change in bowel habits, abdominal bloating, rectal pressure, incomplete emptying.  Patient underwent upper endoscopy and colonoscopy which were unremarkable.  Patient may have slow colonic motility.  Patient did not tolerate Linzess 145 MCG samples, resulted in severe abdominal pain.  We will try Trulance, samples provided.  Discussed about lactose-free diet, restora samples provided Advised her to stop consumption of red meat, carbonated beverages, processed foods If her symptoms are still persistent, will try 2 weeks course of Xifaxan 550 mg   Follow up if symptoms are persistent   Cephas Darby, MD

## 2021-03-01 ENCOUNTER — Ambulatory Visit (INDEPENDENT_AMBULATORY_CARE_PROVIDER_SITE_OTHER): Payer: Commercial Managed Care - PPO | Admitting: Primary Care

## 2021-03-01 ENCOUNTER — Other Ambulatory Visit: Payer: Self-pay

## 2021-03-01 ENCOUNTER — Encounter: Payer: Self-pay | Admitting: Primary Care

## 2021-03-01 ENCOUNTER — Other Ambulatory Visit: Payer: Self-pay | Admitting: Gastroenterology

## 2021-03-01 VITALS — BP 130/88 | HR 83 | Temp 97.8°F | Ht 65.0 in | Wt 228.0 lb

## 2021-03-01 DIAGNOSIS — Z23 Encounter for immunization: Secondary | ICD-10-CM | POA: Diagnosis not present

## 2021-03-01 DIAGNOSIS — Z6837 Body mass index (BMI) 37.0-37.9, adult: Secondary | ICD-10-CM

## 2021-03-01 DIAGNOSIS — Z0001 Encounter for general adult medical examination with abnormal findings: Secondary | ICD-10-CM

## 2021-03-01 DIAGNOSIS — E6609 Other obesity due to excess calories: Secondary | ICD-10-CM

## 2021-03-01 DIAGNOSIS — R7303 Prediabetes: Secondary | ICD-10-CM

## 2021-03-01 DIAGNOSIS — L732 Hidradenitis suppurativa: Secondary | ICD-10-CM

## 2021-03-01 DIAGNOSIS — R140 Abdominal distension (gaseous): Secondary | ICD-10-CM

## 2021-03-01 DIAGNOSIS — E559 Vitamin D deficiency, unspecified: Secondary | ICD-10-CM | POA: Diagnosis not present

## 2021-03-01 DIAGNOSIS — G894 Chronic pain syndrome: Secondary | ICD-10-CM

## 2021-03-01 DIAGNOSIS — K648 Other hemorrhoids: Secondary | ICD-10-CM | POA: Diagnosis not present

## 2021-03-01 DIAGNOSIS — K580 Irritable bowel syndrome with diarrhea: Secondary | ICD-10-CM

## 2021-03-01 MED ORDER — CLINDAMYCIN PHOSPHATE 1 % EX SOLN: Freq: Two times a day (BID) | CUTANEOUS | 0 refills | Status: DC

## 2021-03-01 MED ORDER — RIFAXIMIN 550 MG PO TABS: 550.0000 mg | ORAL_TABLET | Freq: Three times a day (TID) | ORAL | 0 refills | Status: AC

## 2021-03-01 NOTE — Assessment & Plan Note (Signed)
Exam today representative of hidradenitis suppurativa. No infection noted on exam.  Trial of clindamycin solution sent to pharmacy. She will update.

## 2021-03-01 NOTE — Assessment & Plan Note (Signed)
Will be starting Trulicity 2.82 mcg per Dr. Ace Gins, orthopedic specialist.   Discussed side effects. She had labs done recently and will send over her results.

## 2021-03-01 NOTE — Assessment & Plan Note (Signed)
Following with orthopedics, continue indomethacin 75 mg BID, cyclobenzaprine 10 mg PRN, OXtellar XR 300 mg TID.

## 2021-03-01 NOTE — Assessment & Plan Note (Signed)
Following with GI, recent colonoscopy and endoscopy reviewed.  She will be starting Xifaxan per GI.

## 2021-03-01 NOTE — Assessment & Plan Note (Signed)
Influenza vaccine provided today. Other vaccines UTD. Pap UTD per patient, follows with GYN. Mammogram due, she will complete at GYN office tomorrow.  Discussed the importance of a healthy diet and regular exercise in order for weight loss, and to reduce the risk of further co-morbidity.  Exam today as noted She will send [place] her recent labs.

## 2021-03-01 NOTE — Assessment & Plan Note (Signed)
Will be starting Trulicity per Dr. Ace Gins.  Discussed side effects. Recommended lab monitoring. She will send [place] her recent lab results.

## 2021-03-01 NOTE — Assessment & Plan Note (Signed)
Not taking vitamin D. Discussed to resume 1000 IU daily.

## 2021-03-01 NOTE — Patient Instructions (Addendum)
Apply the clindamycin solution twice daily under your left arm for a few weeks.   Ask about Xifaxan for abdominal bloating.  Please send me your labs.  Ask your GYN about a mammogram.   It was a pleasure to see you today!

## 2021-03-01 NOTE — Assessment & Plan Note (Signed)
Stable.   Follows with GI.  No concerns today.

## 2021-03-01 NOTE — Progress Notes (Signed)
Subjective:    Patient ID: Victoria Wagner, female    DOB: 02-Jan-1981, 40 y.o.   MRN: [id number]  HPI  Victoria Wagner is a very pleasant 40 y.o. female who presents today for complete physical and follow up of chronic conditions.  She would also like to discuss a chronic open wound. The wound is noted to the left axilla which waxes and wanes with inflammation and swelling. The wound has never closed, remains open and will drain. Every few weeks she will develop an enlargement of the hole which is filled puss. The hole will pop and drain every few weeks. She denies this to the right side. She's not tried anything.   Immunizations: -Tetanus: 2019 -Influenza: Due today -Covid-19: 3 vaccines   Diet: Fair diet.  Exercise: No regular exercise.  Eye exam: Completes annually  Dental exam: Completes semi-annually   Pap Smear: UTD, follows with GYN.  Mammogram: Never completed, sees GYN.   BP Readings from Last 3 Encounters:  03/01/21 130/88  02/18/21 (!) 138/94  12/13/20 138/71       Review of Systems  Constitutional:  Negative for unexpected weight change.  HENT:  Negative for rhinorrhea.   Eyes:  Negative for visual disturbance.  Respiratory:  Negative for cough and shortness of breath.   Cardiovascular:  Negative for chest pain.  Gastrointestinal:  Negative for constipation and diarrhea.       Chronic abdominal bloating  Genitourinary:  Positive for pelvic pain. Negative for difficulty urinating.  Musculoskeletal:  Positive for arthralgias, myalgias and neck pain.  Skin:  Positive for wound.  Allergic/Immunologic: Negative for environmental allergies.  Neurological:  Positive for headaches. Negative for dizziness.  Psychiatric/Behavioral:  The patient is nervous/anxious.         Past Medical History:  Diagnosis Date   Anxiety    Brachial neuritis    Cataract    Post traumatic- per patient   Cervical radiculopathy    Cervical vertigo    Degeneration of cervical  intervertebral disc    Depression    Dizziness    Headache    Joint pain    Muscle spasm    Myalgia    Myofascial pain    Myositis    Neuralgia    Neuritis    Occipital headache    Pain management    Postpartum care following cesarean delivery (07/31/12) 08/01/2012   S/P primary low transverse C-section (07/31/12) 08/01/2012   Swallowing difficulty    Weakness of both hands    Weakness of both legs     Social History   Socioeconomic History   Marital status: Married    Spouse name: Antyuan    Number of children: 1   Years of education: BA   Highest education level: Not on file  Occupational History   Occupation: Unemployed,disability  Tobacco Use   Smoking status: Never   Smokeless tobacco: Never  Vaping Use   Vaping Use: Never used  Substance and Sexual Activity   Alcohol use: No    Alcohol/week: 0.0 standard drinks   Drug use: No   Sexual activity: Not on file  Other Topics Concern   Not on file  Social History Narrative   Married   Right handed.   One child, daughter.   Caffeine use: Drinks green tea (Drinks 2-3 times per day)   Enjoys relaxing, spending time with her husband.   Social Determinants of Health   Financial Resource Strain: Not on file  Food  Insecurity: Not on file  Transportation Needs: Not on file  Physical Activity: Not on file  Stress: Not on file  Social Connections: Not on file  Intimate Partner Violence: Not on file    Past Surgical History:  Procedure Laterality Date   CERVICAL FUSION  04/2010   C3-C6   CESAREAN SECTION N/A 07/31/2012   Procedure: CESAREAN SECTION;  Surgeon: Lovenia Kim, MD;  Location: [facility];  Service: Obstetrics;  Laterality: N/A;   EDD: 08/18/12   COLONOSCOPY WITH PROPOFOL N/A 12/13/2020   Procedure: COLONOSCOPY WITH PROPOFOL;  Surgeon: Lin Landsman, MD;  Location: [facility] END[facility]COPY;  Service: Gastroenterology;  Laterality: N/A;   ECTOPIC PREGNANCY SURGERY  04/2005   ESOPHAGOGASTRODUODENOSCOPY (EGD) WITH  PROPOFOL N/A 12/13/2020   Procedure: ESOPHAGOGASTRODUODENOSCOPY (EGD) WITH PROPOFOL;  Surgeon: Lin Landsman, MD;  Location: [place];  Service: Gastroenterology;  Laterality: N/A;    No family history on file.  Not on File  Current Outpatient Medications on File Prior to Visit  Medication Sig Dispense Refill   cyclobenzaprine (FLEXERIL) 10 MG tablet Take 10 mg by mouth 3 (three) times daily.      desonide (DESOWEN) 0.05 % ointment desonide 0.05 % topical ointment 15 g 0   indomethacin (INDOCIN SR) 75 MG CR capsule Take 75 mg by mouth every 12 (twelve) hours.     NUVARING 0.12-0.015 MG/24HR vaginal ring Place 1 each vaginally every 28 (twenty-eight) days.   11   OXTELLAR XR 300 MG TB24 Take 1 tablet by mouth in the morning, at noon, and at bedtime.     tacrolimus (PROTOPIC) 0.1 % ointment Apply topically 2 (two) times daily. 30 g 0   diazepam (VALIUM) 5 MG tablet Take by mouth. (Patient not taking: No sig reported)     liothyronine (CYTOMEL) 5 MCG tablet PLEASE SEE ATTACHED FOR DETAILED DIRECTIONS     lubiprostone (AMITIZA) 24 MCG capsule TAKE 1 CAPSULE (24 MCG TOTAL) BY MOUTH 2 (TWO) TIMES DAILY WITH A MEAL. (Patient not taking: Reported on 03/01/2021) 026 capsule 0   TRULICITY 3.78 HY/8.5OY SOPN PLEASE SEE ATTACHED FOR DETAILED DIRECTIONS     No current facility-administered medications on file prior to visit.    BP 130/88   Pulse 83   Temp 97.8 F (36.6 C) (Temporal)   Ht 5\' 5"  (1.651 m)   Wt 228 lb (103.4 kg)   SpO2 96%   BMI 37.94 kg/m  Objective:   Physical Exam HENT:     Right Ear: Tympanic membrane and ear canal normal.     Left Ear: Tympanic membrane and ear canal normal.     Nose: Nose normal.  Eyes:     Conjunctiva/sclera: Conjunctivae normal.     Pupils: Pupils are equal, round, and reactive to light.  Neck:     Thyroid: No thyromegaly.  Cardiovascular:     Rate and Rhythm: Normal rate and regular rhythm.     Heart sounds: No murmur  heard. Pulmonary:     Effort: Pulmonary effort is normal.     Breath sounds: Normal breath sounds. No rales.  Abdominal:     General: Bowel sounds are normal.     Palpations: Abdomen is soft.     Tenderness: There is no abdominal tenderness.  Musculoskeletal:        General: Normal range of motion.     Cervical back: Neck supple.  Lymphadenopathy:     Cervical: No cervical adenopathy.  Skin:    General: Skin is  warm and dry.     Findings: No erythema or rash.     Comments: Scarring to left axilla with pinpoint opening with scant clear drainage.  Non tender.   Neurological:     Mental Status: She is alert and oriented to person, place, and time.     Cranial Nerves: No cranial nerve deficit.     Deep Tendon Reflexes: Reflexes are normal and symmetric.  Psychiatric:        Mood and Affect: Mood normal.          Assessment & Plan:      This visit occurred during the SARS-CoV-2 public health emergency.  Safety protocols were in place, including screening questions prior to the visit, additional usage of staff PPE, and extensive cleaning of exam room while observing appropriate contact time as indicated for disinfecting solutions.

## 2021-03-21 ENCOUNTER — Telehealth: Payer: Self-pay | Admitting: Primary Care

## 2021-03-21 NOTE — Telephone Encounter (Signed)
Called patient states that cough has been off and on for 2 weeks. Went away for for about a week and started back over the weekend. She has had some runny nose but no other symptoms. She does not have way to do home covid test before appointment. States that no one else in home has been sick. Her father did pass away 2 weeks ago and she has been around family. Is she ok to come in office or do we need to do as virtual.

## 2021-03-21 NOTE — Telephone Encounter (Signed)
Pt called to schedule an appt for a cough. Pt got upset when I asked her had she had a Covid test and said that if we wanted her to have one we would do it at her appt. Is it ok for pt to come in office on 11/30. Pt wanted a call back from the nurse

## 2021-03-22 NOTE — Telephone Encounter (Signed)
Noted, will evaluate. 

## 2021-03-22 NOTE — Telephone Encounter (Signed)
Called patient reviewed information. She continued to repeat that she does not feel that a virtual will be helpful she just has dry cough. Patient continues to refuse to do home covid test. She was not sure if she wanted to keep virtual or no. Reviewed options given by provider that she can either have negative home test or do virtual and we would do car side testing for anything that Anda Kraft feels like she needs to have. She was unsure of decision at this time. She will call before end of day today to let our office know if it will need to be virtual in office or cancel appointment.

## 2021-03-22 NOTE — Telephone Encounter (Signed)
If she cannot complete a home Covid test prior to appointment then will need to conduct virtual visit. If she can test, and it's negative, then okay to have in person visit.

## 2021-03-22 NOTE — Telephone Encounter (Signed)
Pt called back and said that she took at home test for covid and it came back negative and said she will see yall tomorrow at 11 and also bring proof of the negative result if needed

## 2021-03-23 ENCOUNTER — Encounter: Payer: Self-pay | Admitting: Primary Care

## 2021-03-23 ENCOUNTER — Other Ambulatory Visit: Payer: Self-pay

## 2021-03-23 ENCOUNTER — Ambulatory Visit: Payer: Commercial Managed Care - PPO | Admitting: Primary Care

## 2021-03-23 VITALS — BP 138/80 | HR 98 | Temp 97.6°F | Ht 65.0 in | Wt 229.0 lb

## 2021-03-23 DIAGNOSIS — R051 Acute cough: Secondary | ICD-10-CM | POA: Diagnosis not present

## 2021-03-23 DIAGNOSIS — R059 Cough, unspecified: Secondary | ICD-10-CM | POA: Insufficient documentation

## 2021-03-23 MED ORDER — BENZONATATE 200 MG PO CAPS: 200.0000 mg | ORAL_CAPSULE | Freq: Three times a day (TID) | ORAL | 0 refills | Status: DC | PRN

## 2021-03-23 MED ORDER — AZITHROMYCIN 250 MG PO TABS: ORAL_TABLET | ORAL | 0 refills | Status: DC

## 2021-03-23 NOTE — Patient Instructions (Signed)
Start Azithromycin antibiotics for infection. Take 2 tablets by mouth today, then 1 tablet daily for 4 additional days.  You may take Benzonatate capsules for cough. Take 1 capsule by mouth three times daily as needed for cough.  It was a pleasure to see you today!

## 2021-03-23 NOTE — Progress Notes (Signed)
Subjective:    Patient ID: Victoria Wagner, female    DOB: Apr 17, 1981, 40 y.o.   MRN: [id number]  HPI  Victoria Wagner is a very pleasant 40 y.o. female with a history of headaches from intracranial disease, prediabetes who presents today to discuss cough.  Symptoms initially began on 03/08/21 with a cough which continued for a few days, improved for two days, then returned on 03/13/21 and has been persistent since. Her cough is mostly dry. Some fatigue, chest tightness.   Denies fevers, chills, body aches, post nasal drip. She does feel some fatigued, not her typical self, but about 70% herself today. Her daughter had similar symptoms a few weeks ago, recovered quickly.  She denies a childhood history of asthma, tobacco abuse, esophageal burning, belching. Her daughter has asthma/allergies and uses a nebulizer as needed. She's been taking Robitussin and cough drops with temporary improvement.   She took a home Covid-19 test yesterday which was negative.    Review of Systems  Constitutional:  Positive for fatigue. Negative for fever.  HENT:  Negative for congestion and sore throat.   Respiratory:  Positive for chest tightness and shortness of breath.   Musculoskeletal:  Negative for myalgias.        Past Medical History:  Diagnosis Date   Anxiety    Brachial neuritis    Cataract    Post traumatic- per patient   Cervical radiculopathy    Cervical vertigo    Degeneration of cervical intervertebral disc    Depression    Dizziness    Headache    Joint pain    Muscle spasm    Myalgia    Myofascial pain    Myositis    Neuralgia    Neuritis    Occipital headache    Pain management    Postpartum care following cesarean delivery (07/31/12) 08/01/2012   Rash and nonspecific skin eruption 01/31/2019   S/P primary low transverse C-section (07/31/12) 08/01/2012   Swallowing difficulty    Weakness of both hands    Weakness of both legs     Social History   Socioeconomic History    Marital status: Married    Spouse name: Antyuan    Number of children: 1   Years of education: BA   Highest education level: Not on file  Occupational History   Occupation: Unemployed,disability  Tobacco Use   Smoking status: Never   Smokeless tobacco: Never  Vaping Use   Vaping Use: Never used  Substance and Sexual Activity   Alcohol use: No    Alcohol/week: 0.0 standard drinks   Drug use: No   Sexual activity: Not on file  Other Topics Concern   Not on file  Social History Narrative   Married   Right handed.   One child, daughter.   Caffeine use: Drinks green tea (Drinks 2-3 times per day)   Enjoys relaxing, spending time with her husband.   Social Determinants of Health   Financial Resource Strain: Not on file  Food Insecurity: Not on file  Transportation Needs: Not on file  Physical Activity: Not on file  Stress: Not on file  Social Connections: Not on file  Intimate Partner Violence: Not on file    Past Surgical History:  Procedure Laterality Date   CERVICAL FUSION  04/2010   C3-C6   CESAREAN SECTION N/A 07/31/2012   Procedure: CESAREAN SECTION;  Surgeon: Lovenia Kim, MD;  Location: [facility];  Service: Obstetrics;  Laterality: N/A;  EDD: 08/18/12   COLONOSCOPY WITH PROPOFOL N/A 12/13/2020   Procedure: COLONOSCOPY WITH PROPOFOL;  Surgeon: Lin Landsman, MD;  Location: [place];  Service: Gastroenterology;  Laterality: N/A;   ECTOPIC PREGNANCY SURGERY  04/2005   ESOPHAGOGASTRODUODENOSCOPY (EGD) WITH PROPOFOL N/A 12/13/2020   Procedure: ESOPHAGOGASTRODUODENOSCOPY (EGD) WITH PROPOFOL;  Surgeon: Lin Landsman, MD;  Location: [place];  Service: Gastroenterology;  Laterality: N/A;    History reviewed. No pertinent family history.  No Known Allergies  Current Outpatient Medications on File Prior to Visit  Medication Sig Dispense Refill   clindamycin (CLEOCIN-T) 1 % external solution Apply topically 2 (two) times daily. 30 mL 0    cyclobenzaprine (FLEXERIL) 10 MG tablet Take 10 mg by mouth 3 (three) times daily.      desonide (DESOWEN) 0.05 % ointment desonide 0.05 % topical ointment 15 g 0   indomethacin (INDOCIN SR) 75 MG CR capsule Take 75 mg by mouth every 12 (twelve) hours.     NUVARING 0.12-0.015 MG/24HR vaginal ring Place 1 each vaginally every 28 (twenty-eight) days.   11   OXTELLAR XR 300 MG TB24 Take 1 tablet by mouth in the morning, at noon, and at bedtime.     tacrolimus (PROTOPIC) 0.1 % ointment Apply topically 2 (two) times daily. 30 g 0   diazepam (VALIUM) 5 MG tablet Take by mouth. (Patient not taking: Reported on 02/18/2021)     liothyronine (CYTOMEL) 5 MCG tablet PLEASE SEE ATTACHED FOR DETAILED DIRECTIONS (Patient not taking: Reported on 47/07/2593)     TRULICITY 6.38 VF/6.4PP SOPN PLEASE SEE ATTACHED FOR DETAILED DIRECTIONS (Patient not taking: Reported on 03/01/2021)     No current facility-administered medications on file prior to visit.    BP 138/80   Pulse 98   Temp 97.6 F (36.4 C) (Temporal)   Ht 5\' 5"  (1.651 m)   Wt 229 lb (103.9 kg)   SpO2 99%   BMI 38.11 kg/m  Objective:   Physical Exam Constitutional:      General: She is not in acute distress.    Appearance: She is ill-appearing.  Cardiovascular:     Rate and Rhythm: Normal rate and regular rhythm.  Pulmonary:     Effort: Pulmonary effort is normal.     Breath sounds: Normal breath sounds.     Comments: Dry cough noted several times throughout visit Musculoskeletal:     Cervical back: Neck supple.  Skin:    General: Skin is warm and dry.          Assessment & Plan:      This visit occurred during the SARS-CoV-2 public health emergency.  Safety protocols were in place, including screening questions prior to the visit, additional usage of staff PPE, and extensive cleaning of exam room while observing appropriate contact time as indicated for disinfecting solutions.

## 2021-03-23 NOTE — Assessment & Plan Note (Signed)
Two week history of persistent cough, exam today with clear lungs, but she does appear tired and is coughing throughout visit.  Given duration of symptoms without improvement, will treat for presumed bacterial cause.  Rx for azithromycin 250 mg tablets and Tessalon Perles sent to pharmacy.  Return precautions provided.

## 2021-04-12 ENCOUNTER — Encounter: Payer: Self-pay | Admitting: Gastroenterology

## 2021-04-13 ENCOUNTER — Telehealth: Payer: Self-pay

## 2021-04-13 MED ORDER — LUBIPROSTONE 24 MCG PO CAPS: 24.0000 ug | ORAL_CAPSULE | Freq: Two times a day (BID) | ORAL | 3 refills | Status: DC

## 2021-04-13 NOTE — Telephone Encounter (Signed)
Sent in new prescription as requested by pharmacy

## 2021-06-16 ENCOUNTER — Ambulatory Visit: Payer: Commercial Managed Care - PPO | Admitting: Gastroenterology

## 2021-08-11 ENCOUNTER — Encounter: Payer: Self-pay | Admitting: Gastroenterology

## 2021-08-11 ENCOUNTER — Ambulatory Visit: Payer: Commercial Managed Care - PPO | Admitting: Gastroenterology

## 2021-08-11 ENCOUNTER — Other Ambulatory Visit: Payer: Self-pay

## 2021-08-11 ENCOUNTER — Other Ambulatory Visit: Payer: Self-pay | Admitting: Gastroenterology

## 2021-08-11 VITALS — BP 159/110 | HR 103 | Temp 98.0°F | Ht 65.0 in | Wt 236.0 lb

## 2021-08-11 DIAGNOSIS — R140 Abdominal distension (gaseous): Secondary | ICD-10-CM

## 2021-08-11 MED ORDER — DOXYCYCLINE HYCLATE 100 MG PO TBEC: 100.0000 mg | DELAYED_RELEASE_TABLET | Freq: Two times a day (BID) | ORAL | 0 refills | Status: AC

## 2021-08-11 NOTE — Progress Notes (Signed)
?  ?Cephas Darby, MD ?[address]  ?[address]  ?[place] [address]  ?Main: [phone]  ?Fax: [phone] ? ? ? ?Gastroenterology Consultation ? ?Referring Provider:     Pleas Koch, NP ?Primary Care Physician:  Pleas Koch, NP ?Primary Gastroenterologist:  Dr. Cephas Darby ?Reason for Consultation:     Altered bowel habits, rectal discomfort, abdominal bloating ?      ? HPI:   ?Victoria Wagner is a 41 y.o. female referred by Dr. Carlis Abbott, Leticia Penna, NP  for consultation & management of change in bowel habits, rectal discomfort and abdominal bloating.  Patient reports that for last 6 months, she has been experiencing the above symptoms.  She has sensation of incomplete emptying although her stools are not hard.  She also reports abdominal discomfort associated with significant abdominal bloating early morning after she wakes up.  She does report intermittent sharp rectal pain with or without a bowel movement.  Her weight has been stable.  She denies any change in her diet.  She does drink ginger ale daily which she started for abdominal bloating.  She is on disability due to cervical spine fusion, mostly stays at home.  She denies any rectal bleeding.  Her labs are unremarkable including CBC, CMP, TSH.  She had H. pylori stool antigen test negative in 2019.  She has been taking smooth tea that has senna. ? ?Follow-up visit 02/18/2021 ?Patient is here to discuss about abdominal bloating.  She underwent upper endoscopy and colonoscopy, including biopsies were unremarkable.  Patient reports that she had regular bowel movements after the colonoscopy until 2 weeks ago.  She is now experiencing severe abdominal bloating, incomplete emptying.  She has bowel movement every other day and soft.  She tried Linzess 145 MCG samples that resulted in severe pain.  Patient is gaining weight.  She is a smooth tea is also not helping. ? ?Follow-up visit 08/11/2021 ?Patient is here to discuss about  ongoing symptoms of abdominal bloating.  She notices flareup of abdominal bloating with incomplete emptying particularly when she uses Medrol pack when she has flareup of her musculoskeletal pain.  Otherwise, she reports that she is not longer experiencing constipation.  Her main concern is abdominal bloating.  She has been taking a herbal product with gingerroot, artichoke leaf and liquid rice as well as peppermint.  She noticed that her bowel movements have become regular on this.  She has been gaining weight every time she is on prednisone course.  She is not able to offered rifaximin that I prescribed her earlier for bacterial overgrowth. ? ?Patient does not smoke or drink alcohol ?She denies family history of GI malignancy ? ?NSAIDs: None ? ?Antiplts/Anticoagulants/Anti thrombotics: None ? ?GI Procedures:  ?EGD and colonoscopy 12/13/2020 ?- Normal esophagus and gastroesophageal junction. ?- Normal stomach. Biopsied. ?- Normal examined duodenum. ? ?- One 5 mm polyp in the ascending colon, removed with a cold snare. Resected and retrieved. ?- Diverticulosis in the sigmoid colon. ?- The distal rectum and anal verge are normal on retroflexion view. ?- The examined portion of the ileum was normal. ? ?DIAGNOSIS:  ?A. STOMACH, RANDOM; COLD BIOPSY:  ?- MILD CHRONIC GASTRITIS.  ?- NEGATIVE FOR ACTIVE INFLAMMATION AND H PYLORI.  ?- NEGATIVE FOR INTESTINAL METAPLASIA, DYSPLASIA, AND MALIGNANCY.  ? ?B. COLON POLYP, ASCENDING; COLD SNARE:  ?- SESSILE SERRATED POLYP.  ?- NEGATIVE FOR DYSPLASIA AND MALIGNANCY.  ? ? ? ?Past Medical History:  ?Diagnosis Date  ?  Anxiety   ? Brachial neuritis   ? Cataract   ? Post traumatic- per patient  ? Cervical radiculopathy   ? Cervical vertigo   ? Degeneration of cervical intervertebral disc   ? Depression   ? Dizziness   ? Headache   ? Joint pain   ? Muscle spasm   ? Myalgia   ? Myofascial pain   ? Myositis   ? Neuralgia   ? Neuritis   ? Occipital headache   ? Pain management   ? Postpartum  care following cesarean delivery (07/31/12) 08/01/2012  ? Rash and nonspecific skin eruption 01/31/2019  ? S/P primary low transverse C-section (07/31/12) 08/01/2012  ? Swallowing difficulty   ? Weakness of both hands   ? Weakness of both legs   ? ? ?Past Surgical History:  ?Procedure Laterality Date  ? CERVICAL FUSION  04/2010  ? C3-C6  ? CESAREAN SECTION N/A 07/31/2012  ? Procedure: CESAREAN SECTION;  Surgeon: Lovenia Kim, MD;  Location: [facility];  Service: Obstetrics;  Laterality: N/A;   EDD: 08/18/12  ? COLONOSCOPY WITH PROPOFOL N/A 12/13/2020  ? Procedure: COLONOSCOPY WITH PROPOFOL;  Surgeon: Lin Landsman, MD;  Location: [facility]COPY;  Service: Gastroenterology;  Laterality: N/A;  ? ECTOPIC PREGNANCY SURGERY  04/2005  ? ESOPHAGOGASTRODUODENOSCOPY (EGD) WITH PROPOFOL N/A 12/13/2020  ? Procedure: ESOPHAGOGASTRODUODENOSCOPY (EGD) WITH PROPOFOL;  Surgeon: Lin Landsman, MD;  Location: [facility] ENDOSCOPY;  Service: Gastroenterology;  Laterality: N/A;  ? ? ?Current Outpatient Medications:  ?  cyclobenzaprine (FLEXERIL) 10 MG tablet, Take 10 mg by mouth 3 (three) times daily. , Disp: , Rfl:  ?  desonide (DESOWEN) 0.05 % ointment, desonide 0.05 % topical ointment, Disp: 15 g, Rfl: 0 ?  doxycycline (DORYX) 100 MG EC tablet, Take 1 tablet (100 mg total) by mouth 2 (two) times daily for 10 days., Disp: 20 tablet, Rfl: 0 ?  indomethacin (INDOCIN SR) 75 MG CR capsule, Take 75 mg by mouth every 12 (twelve) hours., Disp: , Rfl:  ?  NUVARING 0.12-0.015 MG/24HR vaginal ring, Place 1 each vaginally every 28 (twenty-eight) days. , Disp: , Rfl: 11 ?  oxcarbazepine (TRILEPTAL) 600 MG tablet, Take 600 mg by mouth every 6 (six) hours., Disp: , Rfl:  ? ? ? ?No family history on file.  ? ?Social History  ? ?Tobacco Use  ? Smoking status: Never  ? Smokeless tobacco: Never  ?Vaping Use  ? Vaping Use: Never used  ?Substance Use Topics  ? Alcohol use: No  ?  Alcohol/week: 0.0 standard drinks  ? Drug use: No  ? ? ?Allergies as of  08/11/2021  ? (No Known Allergies)  ? ? ?Review of Systems:    ?All systems reviewed and negative except where noted in HPI. ? ? Physical Exam:  ?BP (!) 159/110 (BP Location: Left Arm, Patient Position: Sitting, Cuff Size: Normal)   Pulse (!) 103   Temp 98 ?F (36.7 ?C) (Oral)   Ht '5\' 5"'$  (1.651 m)   Wt 236 lb (107 kg)   BMI 39.27 kg/m?  ?No LMP recorded. (Menstrual status: Other). ? ?General:   Alert,  Well-developed, well-nourished, pleasant and cooperative in NAD ?Head:  Normocephalic and atraumatic. ?Eyes:  Sclera clear, no icterus.   Conjunctiva pink. ?Ears:  Normal auditory acuity. ?Nose:  No deformity, discharge, or lesions. ?Mouth:  No deformity or lesions,oropharynx pink & moist. ?Neck:  Supple; no masses or thyromegaly. ?Lungs:  Respirations even and unlabored.  Clear throughout to auscultation.  No wheezes, crackles, or rhonchi. No acute distress. ?Heart:  Regular rate and rhythm; no murmurs, clicks, rubs, or gallops. ?Abdomen:  Normal bowel sounds. Soft, non-tender and moderately distended, tympanic without masses, hepatosplenomegaly or hernias noted.  No guarding or rebound tenderness.   ?Rectal: Normal perianal skin, nontender digital rectal exam, soft brown stool in the rectal vault ?Msk:  Symmetrical without gross deformities. Good, equal movement & strength bilaterally. ?Pulses:  Normal pulses noted. ?Extremities:  No clubbing or edema.  No cyanosis. ?Neurologic:  Alert and oriented x3;  grossly normal neurologically. ?Skin:  Intact without significant lesions or rashes. No jaundice. ?Psych:  Alert and cooperative. Normal mood and affect. ? ?Imaging Studies: ?No abdominal imaging ? ?Assessment and Plan:  ? ?Sicilia G Dwan is a 41 y.o. African-American female with BMI 36.9, chronic pain, on indomethacin, Flexeril, cervical spine fusion is seen in consultation for change in bowel habits, abdominal bloating, rectal pressure, incomplete emptying.  Patient underwent upper endoscopy and colonoscopy  which were unremarkable.  Patient may have slow colonic motility.  Patient did not tolerate Linzess 145 MCG samples, resulted in severe abdominal pain.  Her constipation has currently resolved.  Patient could not afford

## 2021-10-18 ENCOUNTER — Other Ambulatory Visit: Payer: Self-pay

## 2021-10-18 ENCOUNTER — Encounter (HOSPITAL_BASED_OUTPATIENT_CLINIC_OR_DEPARTMENT_OTHER): Payer: Self-pay | Admitting: Obstetrics and Gynecology

## 2021-10-18 NOTE — Progress Notes (Signed)
Spoke w/ via phone for pre-op interview---[facility] Lab needs dos----cbc, urine pregnancy               Lab results------none COVID test -----patient states asymptomatic no test needed Arrive at -------0530 on Thursday, October 27, 2021 NPO after MN NO Solid Food.  Clear liquids from MN until---0430 Med rec completed Medications to take morning of surgery -----Flexeril, Trilepta Diabetic medication -----n/a Patient instructed no nail polish to be worn day of surgery Patient instructed to bring photo id and insurance card day of surgery Patient aware to have Driver (ride ) / caregiver    for 24 hours after surgery - husband, Antyuan Patient Special Instructions -----none Pre-Op special Istructions -----none Patient verbalized understanding of instructions that were given at this phone interview. Patient denies shortness of breath, chest pain, fever, cough at this phone interview.

## 2021-10-21 ENCOUNTER — Encounter: Payer: Self-pay | Admitting: Gastroenterology

## 2021-10-22 ENCOUNTER — Encounter: Payer: Self-pay | Admitting: Gastroenterology

## 2021-10-22 ENCOUNTER — Other Ambulatory Visit: Payer: Self-pay | Admitting: Gastroenterology

## 2021-10-22 MED ORDER — DOXYCYCLINE MONOHYDRATE 100 MG PO TABS: 100.0000 mg | ORAL_TABLET | Freq: Two times a day (BID) | ORAL | 0 refills | Status: AC

## 2021-10-24 ENCOUNTER — Encounter: Payer: Self-pay | Admitting: Gastroenterology

## 2021-10-26 NOTE — H&P (Signed)
Victoria Wagner is an 41 y.o. female. She has a h/o menorrhagia and wants to stop Nuvaring.  Normal SIUS and benign pipelle in 11/2020.  Also desires permanent sterility  Pertinent Gynecological History: Last pap: normal Date: 2019 OB History: G2, P0111   Menstrual History: No LMP recorded. (Menstrual status: Other).    Past Medical History:  Diagnosis Date   Anxiety    Brachial neuritis    Cataract    Post traumatic- per patient   Cervical radiculopathy    Cervical vertigo    Degeneration of cervical intervertebral disc    Depression    Dizziness    Joint pain    Muscle spasm    Myalgia    Myofascial pain    Myositis    Neuralgia    Neuritis    Occipital headache    Pain management    Postpartum care following cesarean delivery (07/31/12) 08/01/2012   Rash and nonspecific skin eruption 01/31/2019   S/P primary low transverse C-section (07/31/12) 08/01/2012   Swallowing difficulty    improved as 10/18/21 per pt   Weakness of both hands    from cervical fusion   Weakness of both legs    from cervical fusion, patient states that she is able to walk, if she starts feeling weak she lays down, as of 10/18/21    Past Surgical History:  Procedure Laterality Date   CERVICAL FUSION  04/2010   C3-C6   CESAREAN SECTION N/A 07/31/2012   Procedure: CESAREAN SECTION;  Surgeon: Lovenia Kim, MD;  Location: [facility];  Service: Obstetrics;  Laterality: N/A;   EDD: 08/18/12   COLONOSCOPY WITH PROPOFOL N/A 12/13/2020   Procedure: COLONOSCOPY WITH PROPOFOL;  Surgeon: Lin Landsman, MD;  Location: [facility];  Service: Gastroenterology;  Laterality: N/A;   ECTOPIC PREGNANCY SURGERY  04/2005   ESOPHAGOGASTRODUODENOSCOPY (EGD) WITH PROPOFOL N/A 12/13/2020   Procedure: ESOPHAGOGASTRODUODENOSCOPY (EGD) WITH PROPOFOL;  Surgeon: Lin Landsman, MD;  Location: [facility];  Service: Gastroenterology;  Laterality: N/A;    History reviewed. No pertinent family history.  Social  History:  reports that she has never smoked. She has never used smokeless tobacco. She reports that she does not drink alcohol and does not use drugs.  Allergies: No Known Allergies  No medications prior to admission.    Review of Systems  Respiratory: Negative.    Cardiovascular: Negative.     Weight 108.9 kg. Physical Exam Constitutional:      Appearance: Normal appearance.  Cardiovascular:     Rate and Rhythm: Normal rate and regular rhythm.     Heart sounds: Normal heart sounds. No murmur heard. Pulmonary:     Effort: Pulmonary effort is normal. No respiratory distress.     Breath sounds: Normal breath sounds. No wheezing.  Abdominal:     General: There is no distension.     Palpations: Abdomen is soft. There is no mass.     Tenderness: There is no abdominal tenderness.  Genitourinary:    General: Normal vulva.     Comments: Uterus normal No adnexal mass Musculoskeletal:     Cervical back: Normal range of motion and neck supple.  Neurological:     Mental Status: She is alert.     No results found for this or any previous visit (from the past 24 hour(s)).  No results found.  Assessment/Plan: Menorrhagia, desires permanent sterility.  All medical and surgical options have been discussed.  Permanency, failure rate, and options for BTL  have been discussed, all questions answered.  Will admit for laparoscopic bilateral salpingectomy and hysteroscopy and Novasure ablation  Clarene Wagner 10/26/2021, 7:31 PM

## 2021-10-26 NOTE — Anesthesia Preprocedure Evaluation (Signed)
Anesthesia Evaluation  Patient identified by MRN, date of birth, ID band Patient awake    Reviewed: Allergy & Precautions, NPO status , Patient's Chart, lab work & pertinent test results  History of Anesthesia Complications Negative for: history of anesthetic complications  Airway Mallampati: I  TM Distance: >3 FB Neck ROM: Limited    Dental no notable dental hx. (+) Teeth Intact, Dental Advisory Given   Pulmonary neg pulmonary ROS, neg sleep apnea, neg COPD, Patient abstained from smoking.Not current smoker,    Pulmonary exam normal breath sounds clear to auscultation       Cardiovascular Exercise Tolerance: Good METS(-) hypertension(-) CAD and (-) Past MI negative cardio ROS Normal cardiovascular exam(-) dysrhythmias  Rhythm:Regular Rate:Normal     Neuro/Psych  Headaches, PSYCHIATRIC DISORDERS Anxiety Depression  Neuromuscular disease    GI/Hepatic neg GERD  ,(+)     (-) substance abuse  ,   Endo/Other  neg diabetesMorbid obesity  Renal/GU negative Renal ROS     Musculoskeletal  (+) Arthritis , Fibromyalgia -  Abdominal   Peds  Hematology   Anesthesia Other Findings Past Medical   Pain management  Limited ROM secondary to fusion  Reproductive/Obstetrics                            Anesthesia Physical  Anesthesia Plan  ASA: 3  Anesthesia Plan: General   Post-op Pain Management: Minimal or no pain anticipated   Induction: Intravenous  PONV Risk Score and Plan: 3 and Ondansetron and Dexamethasone  Airway Management Planned: Video Laryngoscope Planned and Oral ETT  Additional Equipment: None  Intra-op Plan:   Post-operative Plan: Extubation in OR  Informed Consent: I have reviewed the patients History and Physical, chart, labs and discussed the procedure including the risks, benefits and alternatives for the proposed anesthesia with the patient or authorized representative  who has indicated his/her understanding and acceptance.     Dental advisory given  Plan Discussed with: CRNA and Anesthesiologist  Anesthesia Plan Comments: (  )       Anesthesia Quick Evaluation

## 2021-10-27 ENCOUNTER — Encounter (HOSPITAL_BASED_OUTPATIENT_CLINIC_OR_DEPARTMENT_OTHER)
Admission: RE | Disposition: A | Discharge status: Home / Self Care | Payer: Self-pay | Source: Home / Self Care | Attending: Obstetrics and Gynecology

## 2021-10-27 ENCOUNTER — Ambulatory Visit (HOSPITAL_BASED_OUTPATIENT_CLINIC_OR_DEPARTMENT_OTHER): Payer: Commercial Managed Care - PPO | Admitting: Certified Registered Nurse Anesthetist

## 2021-10-27 ENCOUNTER — Encounter (HOSPITAL_BASED_OUTPATIENT_CLINIC_OR_DEPARTMENT_OTHER): Payer: Self-pay | Admitting: Obstetrics and Gynecology

## 2021-10-27 ENCOUNTER — Other Ambulatory Visit: Payer: Self-pay

## 2021-10-27 ENCOUNTER — Ambulatory Visit (HOSPITAL_BASED_OUTPATIENT_CLINIC_OR_DEPARTMENT_OTHER)
Admission: RE | Admit: 2021-10-27 | Discharge: 2021-10-27 | Disposition: A | Discharge status: Home / Self Care | Payer: Commercial Managed Care - PPO | Attending: Obstetrics and Gynecology | Admitting: Obstetrics and Gynecology

## 2021-10-27 DIAGNOSIS — N920 Excessive and frequent menstruation with regular cycle: Secondary | ICD-10-CM | POA: Insufficient documentation

## 2021-10-27 DIAGNOSIS — M1990 Unspecified osteoarthritis, unspecified site: Secondary | ICD-10-CM | POA: Diagnosis not present

## 2021-10-27 DIAGNOSIS — M797 Fibromyalgia: Secondary | ICD-10-CM | POA: Insufficient documentation

## 2021-10-27 DIAGNOSIS — Z302 Encounter for sterilization: Secondary | ICD-10-CM

## 2021-10-27 DIAGNOSIS — Z6837 Body mass index (BMI) 37.0-37.9, adult: Secondary | ICD-10-CM | POA: Diagnosis not present

## 2021-10-27 DIAGNOSIS — Z01818 Encounter for other preprocedural examination: Secondary | ICD-10-CM

## 2021-10-27 HISTORY — PX: LAPAROSCOPIC BILATERAL SALPINGECTOMY: SHX5889

## 2021-10-27 HISTORY — PX: DILITATION & CURRETTAGE/HYSTROSCOPY WITH NOVASURE ABLATION: SHX5568

## 2021-10-27 LAB — CBC
HCT: 39.9 % (ref 36.0–46.0)
Hemoglobin: 12.5 g/dL (ref 12.0–15.0)
MCH: 25.7 pg — ABNORMAL LOW (ref 26.0–34.0)
MCHC: 31.3 g/dL (ref 30.0–36.0)
MCV: 82.1 fL (ref 80.0–100.0)
Platelets: 369 10*3/uL (ref 150–400)
RBC: 4.86 MIL/uL (ref 3.87–5.11)
RDW: 15.9 % — ABNORMAL HIGH (ref 11.5–15.5)
WBC: 8.6 10*3/uL (ref 4.0–10.5)
nRBC: 0 % (ref 0.0–0.2)

## 2021-10-27 LAB — POCT PREGNANCY, URINE: Preg Test, Ur: NEGATIVE

## 2021-10-27 SURGERY — SALPINGECTOMY, BILATERAL, LAPAROSCOPIC
Anesthesia: General

## 2021-10-27 MED ORDER — LIDOCAINE HCL (PF) 2 % IJ SOLN
INTRAMUSCULAR | Status: AC
  Filled 2021-10-27: qty 5

## 2021-10-27 MED ORDER — ACETAMINOPHEN 325 MG PO TABS: 325.0000 mg | ORAL_TABLET | ORAL | Status: DC | PRN

## 2021-10-27 MED ORDER — LACTATED RINGERS IV SOLN: INTRAVENOUS | Status: DC

## 2021-10-27 MED ORDER — LABETALOL HCL 5 MG/ML IV SOLN
INTRAVENOUS | Status: AC
  Filled 2021-10-27: qty 4

## 2021-10-27 MED ORDER — KETOROLAC TROMETHAMINE 30 MG/ML IJ SOLN
INTRAMUSCULAR | Status: DC | PRN
  Administered 2021-10-27: 30 mg via INTRAVENOUS

## 2021-10-27 MED ORDER — LABETALOL HCL 5 MG/ML IV SOLN
INTRAVENOUS | Status: DC | PRN
  Administered 2021-10-27 (×2): 2.5 mg via INTRAVENOUS

## 2021-10-27 MED ORDER — LACTATED RINGERS IV SOLN
INTRAVENOUS | Status: DC
Start: 2021-10-27 — End: 2021-10-27

## 2021-10-27 MED ORDER — ROCURONIUM BROMIDE 10 MG/ML (PF) SYRINGE
PREFILLED_SYRINGE | INTRAVENOUS | Status: AC
  Filled 2021-10-27: qty 10

## 2021-10-27 MED ORDER — MIDAZOLAM HCL 2 MG/2ML IJ SOLN
INTRAMUSCULAR | Status: DC | PRN
  Administered 2021-10-27: 2 mg via INTRAVENOUS

## 2021-10-27 MED ORDER — HYDROCODONE-ACETAMINOPHEN 5-325 MG PO TABS: 1.0000 | ORAL_TABLET | Freq: Four times a day (QID) | ORAL | 0 refills | Status: DC | PRN

## 2021-10-27 MED ORDER — ROCURONIUM BROMIDE 10 MG/ML (PF) SYRINGE
PREFILLED_SYRINGE | INTRAVENOUS | Status: DC | PRN
  Administered 2021-10-27: 60 mg via INTRAVENOUS

## 2021-10-27 MED ORDER — DEXMEDETOMIDINE (PRECEDEX) IN NS 20 MCG/5ML (4 MCG/ML) IV SYRINGE
PREFILLED_SYRINGE | INTRAVENOUS | Status: DC | PRN
  Administered 2021-10-27: 12 ug via INTRAVENOUS

## 2021-10-27 MED ORDER — PROPOFOL 10 MG/ML IV BOLUS
INTRAVENOUS | Status: DC | PRN
  Administered 2021-10-27: 200 mg via INTRAVENOUS

## 2021-10-27 MED ORDER — HYDRALAZINE HCL 20 MG/ML IJ SOLN
10.0000 mg | Freq: Once | INTRAMUSCULAR | Status: AC
  Administered 2021-10-27: 10 mg via INTRAVENOUS

## 2021-10-27 MED ORDER — ONDANSETRON HCL 4 MG/2ML IJ SOLN
4.0000 mg | Freq: Once | INTRAMUSCULAR | Status: AC | PRN
Start: 2021-10-27 — End: 2021-10-27
  Administered 2021-10-27: 4 mg via INTRAVENOUS

## 2021-10-27 MED ORDER — FENTANYL CITRATE (PF) 100 MCG/2ML IJ SOLN
25.0000 ug | INTRAMUSCULAR | Status: DC | PRN
  Administered 2021-10-27 (×2): 50 ug via INTRAVENOUS

## 2021-10-27 MED ORDER — ONDANSETRON HCL 4 MG/2ML IJ SOLN
INTRAMUSCULAR | Status: AC
  Filled 2021-10-27: qty 2

## 2021-10-27 MED ORDER — 0.9 % SODIUM CHLORIDE (POUR BTL) OPTIME
TOPICAL | Status: DC | PRN
  Administered 2021-10-27: 500 mL

## 2021-10-27 MED ORDER — DEXMEDETOMIDINE HCL IN NACL 80 MCG/20ML IV SOLN
INTRAVENOUS | Status: AC
  Filled 2021-10-27: qty 20

## 2021-10-27 MED ORDER — BUPIVACAINE HCL (PF) 0.25 % IJ SOLN
INTRAMUSCULAR | Status: DC | PRN
  Administered 2021-10-27: 10 mL

## 2021-10-27 MED ORDER — SUGAMMADEX SODIUM 500 MG/5ML IV SOLN
INTRAVENOUS | Status: AC
  Filled 2021-10-27: qty 5

## 2021-10-27 MED ORDER — FENTANYL CITRATE (PF) 100 MCG/2ML IJ SOLN
INTRAMUSCULAR | Status: AC
  Filled 2021-10-27: qty 2

## 2021-10-27 MED ORDER — WHITE PETROLATUM EX OINT
TOPICAL_OINTMENT | CUTANEOUS | Status: AC
  Filled 2021-10-27: qty 5

## 2021-10-27 MED ORDER — OXYCODONE HCL 5 MG PO TABS: 5.0000 mg | ORAL_TABLET | Freq: Once | ORAL | Status: DC | PRN

## 2021-10-27 MED ORDER — LIDOCAINE 2% (20 MG/ML) 5 ML SYRINGE
INTRAMUSCULAR | Status: DC | PRN
  Administered 2021-10-27: 60 mg via INTRAVENOUS

## 2021-10-27 MED ORDER — SODIUM CHLORIDE 0.9 % IR SOLN
Status: DC | PRN
  Administered 2021-10-27: 3000 mL

## 2021-10-27 MED ORDER — SUGAMMADEX SODIUM 200 MG/2ML IV SOLN
INTRAVENOUS | Status: DC | PRN
  Administered 2021-10-27: 250 mg via INTRAVENOUS

## 2021-10-27 MED ORDER — DEXAMETHASONE SODIUM PHOSPHATE 10 MG/ML IJ SOLN
INTRAMUSCULAR | Status: DC | PRN
  Administered 2021-10-27: 5 mg via INTRAVENOUS

## 2021-10-27 MED ORDER — LIDOCAINE HCL 2 % IJ SOLN
INTRAMUSCULAR | Status: DC | PRN
  Administered 2021-10-27: 10 mL

## 2021-10-27 MED ORDER — DEXAMETHASONE SODIUM PHOSPHATE 10 MG/ML IJ SOLN
INTRAMUSCULAR | Status: AC
  Filled 2021-10-27: qty 1

## 2021-10-27 MED ORDER — PROPOFOL 10 MG/ML IV BOLUS
INTRAVENOUS | Status: AC
  Filled 2021-10-27: qty 20

## 2021-10-27 MED ORDER — OXYCODONE HCL 5 MG/5ML PO SOLN: 5.0000 mg | Freq: Once | ORAL | Status: DC | PRN

## 2021-10-27 MED ORDER — MEPERIDINE HCL 25 MG/ML IJ SOLN: 6.2500 mg | INTRAMUSCULAR | Status: DC | PRN

## 2021-10-27 MED ORDER — ACETAMINOPHEN 160 MG/5ML PO SOLN: 325.0000 mg | ORAL | Status: DC | PRN

## 2021-10-27 MED ORDER — ONDANSETRON HCL 4 MG/2ML IJ SOLN
INTRAMUSCULAR | Status: DC | PRN
  Administered 2021-10-27: 4 mg via INTRAVENOUS

## 2021-10-27 MED ORDER — FENTANYL CITRATE (PF) 100 MCG/2ML IJ SOLN
INTRAMUSCULAR | Status: DC | PRN
  Administered 2021-10-27 (×4): 50 ug via INTRAVENOUS

## 2021-10-27 MED ORDER — HYDRALAZINE HCL 20 MG/ML IJ SOLN
INTRAMUSCULAR | Status: AC
  Filled 2021-10-27: qty 1

## 2021-10-27 MED ORDER — KETOROLAC TROMETHAMINE 30 MG/ML IJ SOLN
INTRAMUSCULAR | Status: AC
  Filled 2021-10-27: qty 1

## 2021-10-27 MED ORDER — MIDAZOLAM HCL 2 MG/2ML IJ SOLN
INTRAMUSCULAR | Status: AC
  Filled 2021-10-27: qty 2

## 2021-10-27 SURGICAL SUPPLY — 52 items
ABLATOR SURESOUND NOVASURE (ABLATOR) ×1 IMPLANT
ADH SKN CLS APL DERMABOND .7 (GAUZE/BANDAGES/DRESSINGS) ×2
APL SWBSTK 6 STRL LF DISP (MISCELLANEOUS) ×6
APPLICATOR COTTON TIP 6 STRL (MISCELLANEOUS) IMPLANT
APPLICATOR COTTON TIP 6IN STRL (MISCELLANEOUS) ×9
BIPOLAR CUTTING LOOP 21FR (ELECTRODE)
CABLE HIGH FREQUENCY MONO STRZ (ELECTRODE) ×1 IMPLANT
CATH ROBINSON RED A/P 16FR (CATHETERS) ×3 IMPLANT
COVER MAYO STAND STRL (DRAPES) ×3 IMPLANT
DERMABOND ADVANCED (GAUZE/BANDAGES/DRESSINGS) ×1
DERMABOND ADVANCED .7 DNX12 (GAUZE/BANDAGES/DRESSINGS) ×2 IMPLANT
DILATOR CANAL MILEX (MISCELLANEOUS) IMPLANT
DRSG COVADERM PLUS 2X2 (GAUZE/BANDAGES/DRESSINGS) IMPLANT
DRSG OPSITE POSTOP 3X4 (GAUZE/BANDAGES/DRESSINGS) IMPLANT
DRSG TELFA 3X8 NADH (GAUZE/BANDAGES/DRESSINGS) ×3 IMPLANT
DURAPREP 26ML APPLICATOR (WOUND CARE) ×3 IMPLANT
GAUZE 4X4 16PLY ~~LOC~~+RFID DBL (SPONGE) ×4 IMPLANT
GLOVE BIO SURGEON STRL SZ8 (GLOVE) ×3 IMPLANT
GLOVE BIOGEL PI IND STRL 8 (GLOVE) ×2 IMPLANT
GLOVE BIOGEL PI INDICATOR 8 (GLOVE) ×1
GLOVE ORTHO TXT STRL SZ7.5 (GLOVE) ×3 IMPLANT
GOWN STRL REUS W/TWL XL LVL3 (GOWN DISPOSABLE) ×3 IMPLANT
IV NS 1000ML (IV SOLUTION) ×3
IV NS 1000ML BAXH (IV SOLUTION) ×2 IMPLANT
KIT PROCEDURE FLUENT (KITS) ×1 IMPLANT
KIT TURNOVER CYSTO (KITS) ×3 IMPLANT
LOOP CUTTING BIPOLAR 21FR (ELECTRODE) IMPLANT
NEEDLE INSUFFLATION 120MM (ENDOMECHANICALS) ×3 IMPLANT
NS IRRIG 1000ML POUR BTL (IV SOLUTION) ×3 IMPLANT
PACK LAPAROSCOPY BASIN (CUSTOM PROCEDURE TRAY) ×3 IMPLANT
PACK TRENDGUARD 450 HYBRID PRO (MISCELLANEOUS) IMPLANT
PACK VAGINAL MINOR WOMEN LF (CUSTOM PROCEDURE TRAY) ×3 IMPLANT
PAD DRESSING TELFA 3X8 NADH (GAUZE/BANDAGES/DRESSINGS) ×2 IMPLANT
PAD OB MATERNITY 4.3X12.25 (PERSONAL CARE ITEMS) ×3 IMPLANT
PAD PREP 24X48 CUFFED NSTRL (MISCELLANEOUS) ×3 IMPLANT
PROTECTOR NERVE ULNAR (MISCELLANEOUS) ×4 IMPLANT
SEAL ROD LENS SCOPE MYOSURE (ABLATOR) ×3 IMPLANT
SET SUCTION IRRIG HYDROSURG (IRRIGATION / IRRIGATOR) IMPLANT
SET TUBE SMOKE EVAC HIGH FLOW (TUBING) ×3 IMPLANT
SHEARS 1100 HARMONIC 36 (ELECTROSURGICAL) IMPLANT
SUT VIC AB 4-0 PS2 18 (SUTURE) ×3 IMPLANT
SUT VICRYL 0 UR6 27IN ABS (SUTURE) IMPLANT
SYS BAG RETRIEVAL 10MM (BASKET)
SYSTEM BAG RETRIEVAL 10MM (BASKET) IMPLANT
TOWEL OR 17X26 10 PK STRL BLUE (TOWEL DISPOSABLE) ×3 IMPLANT
TRENDGUARD 450 HYBRID PRO PACK (MISCELLANEOUS) ×3
TROCAR BALLN 12MMX100 BLUNT (TROCAR) IMPLANT
TROCAR BLADELESS OPT 5 100 (ENDOMECHANICALS) ×3 IMPLANT
TROCAR XCEL BLUNT TIP 100MML (ENDOMECHANICALS) IMPLANT
TROCAR XCEL NON-BLD 11X100MML (ENDOMECHANICALS) ×1 IMPLANT
WARMER LAPAROSCOPE (MISCELLANEOUS) ×3 IMPLANT
WATER STERILE IRR 500ML POUR (IV SOLUTION) ×3 IMPLANT

## 2021-10-27 NOTE — Anesthesia Procedure Notes (Addendum)
Procedure Name: Intubation Date/Time: 10/27/2021 7:37 AM  Performed by: Suan Halter, CRNAPre-anesthesia Checklist: Patient identified, Emergency Drugs available, Suction available and Patient being monitored Patient Re-evaluated:Patient Re-evaluated prior to induction Oxygen Delivery Method: Circle system utilized Preoxygenation: Pre-oxygenation with 100% oxygen Induction Type: IV induction Ventilation: Mask ventilation without difficulty Laryngoscope Size: Glidescope, 3 and Mac Grade View: Grade I Tube type: Oral Number of attempts: 1 Airway Equipment and Method: Stylet and Oral airway Placement Confirmation: ETT inserted through vocal cords under direct vision, positive ETCO2 and breath sounds checked- equal and bilateral Secured at: 22 cm Tube secured with: Tape Dental Injury: Teeth and Oropharynx as per pre-operative assessment  Comments: C3-C6 cervical fusion - proceeded directly with glidescope for intubation

## 2021-10-27 NOTE — Interval H&P Note (Signed)
History and Physical Interval Note:  10/27/2021 7:13 AM  Victoria Wagner  has presented today for surgery, with the diagnosis of Sterilization Z30.2, menorrhagia.  The various methods of treatment have been discussed with the patient and family. After consideration of risks, benefits and other options for treatment, the patient has consented to  Procedure(s): LAPAROSCOPIC BILATERAL SALPINGECTOMY (Bilateral) DILATATION & CURETTAGE/HYSTEROSCOPY WITH NOVASURE ABLATION (N/A) as a surgical intervention.  The patient's history has been reviewed, patient examined, no change in status, stable for surgery.  I have reviewed the patient's chart and labs.  Questions were answered to the patient's satisfaction.     Blane Ohara Latanga Nedrow

## 2021-10-27 NOTE — Telephone Encounter (Signed)
Our fax number is [phone] get them to fax [place] the form and I will do the PA for the medication

## 2021-10-27 NOTE — Transfer of Care (Signed)
Immediate Anesthesia Transfer of Care Note  Patient: Ertha G Dorsainvil  Procedure(s) Performed: Procedure(s) (LRB): LAPAROSCOPIC BILATERAL SALPINGECTOMY (Bilateral) DILATATION & CURETTAGE/HYSTEROSCOPY WITH NOVASURE ABLATION (N/A)  Patient Location: PACU  Anesthesia Type: General  Level of Consciousness: awake, oriented, sedated and patient cooperative  Airway & Oxygen Therapy: Patient Spontanous Breathing and Patient connected to face mask oxygen  Post-op Assessment: Report given to PACU RN and Post -op Vital signs reviewed and stable  Post vital signs: Reviewed and stable  Complications: No apparent anesthesia complications  Last Vitals:  Vitals Value Taken Time  BP 176/103 10/27/21 0915  Temp    Pulse 79 10/27/21 0917  Resp 25 10/27/21 0917  SpO2 97 % 10/27/21 0917  Vitals shown include unvalidated device data.  Last Pain:  Vitals:   10/27/21 0603  TempSrc: Oral  PainSc: 0-No pain      Patients Stated Pain Goal: 5 (65/53/74 8270)  Complications: No notable events documented.

## 2021-10-27 NOTE — Addendum Note (Signed)
Addendum  created 10/27/21 1525 by Suan Halter, CRNA   Charge Capture section accepted

## 2021-10-27 NOTE — Anesthesia Postprocedure Evaluation (Signed)
Anesthesia Post Note  Patient: [name]  Procedure(s) Performed: LAPAROSCOPIC BILATERAL SALPINGECTOMY (Bilateral) DILATATION & CURETTAGE/HYSTEROSCOPY WITH [place] ABLATION     Patient location during evaluation: PACU Anesthesia Type: General Level of consciousness: awake and alert Pain management: pain level controlled Vital Signs Assessment: post-procedure vital signs reviewed and stable Respiratory status: spontaneous breathing, nonlabored ventilation, respiratory function stable and patient connected to nasal cannula oxygen Cardiovascular status: blood pressure returned to baseline and stable Postop Assessment: no apparent nausea or vomiting Anesthetic complications: no   No notable events documented.  Last Vitals:  Vitals:   10/27/21 0939 10/27/21 0945  BP: (!) 174/104 (!) 173/105  Pulse: 84 86  Resp: 16 (!) 21  Temp:    SpO2: 100% 100%    Last Pain:  Vitals:   10/27/21 0945  TempSrc:   PainSc: 0-No pain                 James Lafalce

## 2021-10-27 NOTE — Discharge Instructions (Addendum)
Routine instructions for laparoscopy and endometrial ablation    NO IBUPROFEN UNTIL 3:00 PM TODAY.  (IBUPROFEN, ADVIL, MOTRIN, ALEVE, NAPROXEN, CELEBREX)     Post Anesthesia Home Care Instructions  Activity: Get plenty of rest for the remainder of the day. A responsible individual must stay with you for 24 hours following the procedure.  For the next 24 hours, DO NOT: -Drive a car -Paediatric nurse -Drink alcoholic beverages -Take any medication unless instructed by your physician -Make any legal decisions or sign important papers.  Meals: Start with liquid foods such as gelatin or soup. Progress to regular foods as tolerated. Avoid greasy, spicy, heavy foods. If nausea and/or vomiting occur, drink only clear liquids until the nausea and/or vomiting subsides. Call your physician if vomiting continues.  Special Instructions/Symptoms: Your throat may feel dry or sore from the anesthesia or the breathing tube placed in your throat during surgery. If this causes discomfort, gargle with warm salt water. The discomfort should disappear within 24 hours.

## 2021-10-27 NOTE — Op Note (Signed)
Preoperative diagnosis: Desires surgical sterility, menorrhagia Postoperative diagnosis: Same Procedure: Laparoscopic bilateral salpingectomy, hysteroscopy and Novasure ablation Surgeon: Cheri Fowler M.D. Anesthesia: Gen. Endotracheal tube Findings: She had a normal abdomen and pelvis with normal uterus  and ovaries.  Left tube short, normal right tube.  Normal upper abdomen.  Possible small implants of endometriosis in pelvis. Normal endometrial cavity.  Novasure used 4 cm depth, 2.8 cm width, 62 watts for 71 seconds Specimens: Bilateral fallopian tubes Estimated blood loss: 50 Fluid deficit through the hysteroscope was 40 cc Complications: None  Procedure in detail  The patient was taken to the operating room and placed in the dorsosupine position. General anesthesia was induced. Her legs were placed in mobile stirrups and her left arm was tucked to her side. Abdomen perineum and vagina were then prepped and draped in the usual sterile fashion, bladder drained with red robinson catheter, a Hulka tenaculum was applied to the cervix for uterine manipulation. Infraumbilical skin was then infiltrated with quarter percent Marcaine and a 1 cm vertical incision was made. The veress needle was inserted into the peritoneal cavity and placement confirmed by the water drop test and an opening pressure of 3 mm of mercury. CO2 was insufflated to a pressure of 14 mm of mercury and the veress needle was removed. A 10/11 disposable trocar was then introduced with direct visualization with the laparoscope. A 5 mm port was then placed low in the midline also under direct visualization. Inspection revealed the above-mentioned findings with normal anatomy. The hulka tenaculum was noted to be poking through anterior fundus.  The distal end of each tube was grasped and elevated. Using bipolar cautery I was able to free the distal end of each tube from the ovary and fulgurate across the mesosalpinx and a proximal portion  of the fallopian tube. Scissors were then used to remove the fallopian tube. This is done bilaterally without difficulty, the left tube very short. Both segments of tube were removed through the umbilical trocar. The hulka tenaculum was pulled back and perforation site coagulated with bipolar. The 5 mm port was removed under direct visualization. All gas was allowed to deflate from the abdomen and the umbilical trocar was removed. One figure-of-eight suture of 0 Vicryl was placed in the umbilical incision. Skin incisions were then closed with interrupted subcuticular sutures of 4-0 Vicryl followed by Dermabond.   Attention turned vaginally.  The hulka tenaculum was removed.  A Graves speculum was inserted into the vagina and the anterior lip of the cervix was grasped with a single-tooth tenaculum. The paracervical block was then performed with a total of 10 cc 2% lidocaine. Uterus then sounded to 7 cm. Cervix was easily dilated to size 19 dilator. The observer hysteroscope was inserted and good visualization was achieved using lactated Ringer's. The endometrial cavity was normal with no fibroids or significant polyps. The hysteroscope was removed. The cervix was further dilated to a size 7 and size 8 Hegar dilator measuring the cervix a 3 cm. The NovaSure device was inserted and deployed properly. The CO2 test passed. Endometrial ablation was performed with the above-mentioned settings without difficulty. The device was then allowed to cool for about 30 seconds and was removed. Hysteroscopy was then performed which revealed good global endometrial ablation and still no lesions. Hysteroscope and fluid were then removed. The single-tooth tenaculum was removed from the cervix. Bleeding was controlled with pressure. All instruments were then removed from the vagina. The patient tolerated the procedure well and was taken  to the recovery in stable condition. Counts were correct, she received no antibiotics, she had PAS  hose on throughout the procedure.

## 2021-10-28 ENCOUNTER — Encounter (HOSPITAL_BASED_OUTPATIENT_CLINIC_OR_DEPARTMENT_OTHER): Payer: Self-pay | Admitting: Obstetrics and Gynecology

## 2021-10-28 LAB — SURGICAL PATHOLOGY

## 2022-01-24 ENCOUNTER — Encounter: Payer: Self-pay | Admitting: Gastroenterology

## 2022-01-24 ENCOUNTER — Other Ambulatory Visit: Payer: Self-pay | Admitting: Gastroenterology

## 2022-01-24 DIAGNOSIS — R140 Abdominal distension (gaseous): Secondary | ICD-10-CM

## 2022-01-24 MED ORDER — METRONIDAZOLE 500 MG PO TABS: 500.0000 mg | ORAL_TABLET | Freq: Two times a day (BID) | ORAL | 0 refills | Status: DC

## 2022-01-24 NOTE — Telephone Encounter (Signed)
A denial letter did not come to me but it is scanned into her chart from 11/29/2021 that medication was denied

## 2022-02-23 ENCOUNTER — Encounter: Payer: Commercial Managed Care - PPO | Admitting: Primary Care

## 2022-03-09 ENCOUNTER — Encounter: Payer: Commercial Managed Care - PPO | Admitting: Primary Care

## 2022-03-13 ENCOUNTER — Encounter: Payer: Self-pay | Admitting: Gastroenterology

## 2022-03-13 ENCOUNTER — Ambulatory Visit (INDEPENDENT_AMBULATORY_CARE_PROVIDER_SITE_OTHER): Payer: Commercial Managed Care - PPO | Admitting: Primary Care

## 2022-03-13 ENCOUNTER — Encounter: Payer: Self-pay | Admitting: Primary Care

## 2022-03-13 VITALS — BP 152/100 | HR 97 | Temp 97.2°F | Ht 66.0 in | Wt 234.0 lb

## 2022-03-13 DIAGNOSIS — Z0000 Encounter for general adult medical examination without abnormal findings: Secondary | ICD-10-CM

## 2022-03-13 DIAGNOSIS — G894 Chronic pain syndrome: Secondary | ICD-10-CM | POA: Diagnosis not present

## 2022-03-13 DIAGNOSIS — Z1159 Encounter for screening for other viral diseases: Secondary | ICD-10-CM

## 2022-03-13 DIAGNOSIS — R030 Elevated blood-pressure reading, without diagnosis of hypertension: Secondary | ICD-10-CM

## 2022-03-13 DIAGNOSIS — R7303 Prediabetes: Secondary | ICD-10-CM

## 2022-03-13 DIAGNOSIS — Z23 Encounter for immunization: Secondary | ICD-10-CM

## 2022-03-13 DIAGNOSIS — R140 Abdominal distension (gaseous): Secondary | ICD-10-CM

## 2022-03-13 DIAGNOSIS — Z114 Encounter for screening for human immunodeficiency virus [HIV]: Secondary | ICD-10-CM

## 2022-03-13 LAB — COMPREHENSIVE METABOLIC PANEL
ALT: 14 U/L (ref 0–35)
AST: 12 U/L (ref 0–37)
Albumin: 3.9 g/dL (ref 3.5–5.2)
Alkaline Phosphatase: 120 U/L — ABNORMAL HIGH (ref 39–117)
BUN: 10 mg/dL (ref 6–23)
CO2: 29 mEq/L (ref 19–32)
Calcium: 8.9 mg/dL (ref 8.4–10.5)
Chloride: 101 mEq/L (ref 96–112)
Creatinine, Ser: 0.87 mg/dL (ref 0.40–1.20)
GFR: 82.89 mL/min (ref >60.00)
Glucose, Bld: 104 mg/dL — ABNORMAL HIGH (ref 70–99)
Potassium: 3.4 mEq/L — ABNORMAL LOW (ref 3.5–5.1)
Sodium: 138 mEq/L (ref 135–145)
Total Bilirubin: 0.3 mg/dL (ref 0.2–1.2)
Total Protein: 7.4 g/dL (ref 6.0–8.3)

## 2022-03-13 LAB — LIPID PANEL
Cholesterol: 185 mg/dL (ref 0–200)
HDL: 50.6 mg/dL (ref >39.00)
LDL Cholesterol: 121 mg/dL — ABNORMAL HIGH (ref 0–99)
NonHDL: 134.33
Total CHOL/HDL Ratio: 4
Triglycerides: 68 mg/dL (ref 0.0–149.0)
VLDL: 13.6 mg/dL (ref 0.0–40.0)

## 2022-03-13 LAB — HEMOGLOBIN A1C: Hgb A1c MFr Bld: 6.7 % — ABNORMAL HIGH (ref 4.6–6.5)

## 2022-03-13 NOTE — Patient Instructions (Addendum)
Stop by the lab prior to leaving today. I will notify you of your results once received.   Start monitoring your blood pressure daily, around the same time of day, for the next 2-3 weeks.  Ensure that you have rested for 30 minutes prior to checking your blood pressure.   Record your readings and send me readings via MyChart in 1 weeks.  It was a pleasure to see you today!  Preventive Care 57-41 Years Old, Female Preventive care refers to lifestyle choices and visits with your health care provider that can promote health and wellness. Preventive care visits are also called wellness exams. What can I expect for my preventive care visit? Counseling Your health care provider may ask you questions about your: Medical history, including: Past medical problems. Family medical history. Pregnancy history. Current health, including: Menstrual cycle. Method of birth control. Emotional well-being. Home life and relationship well-being. Sexual activity and sexual health. Lifestyle, including: Alcohol, nicotine or tobacco, and drug use. Access to firearms. Diet, exercise, and sleep habits. Work and work Statistician. Sunscreen use. Safety issues such as seatbelt and bike helmet use. Physical exam Your health care provider will check your: Height and weight. These may be used to calculate your BMI (body mass index). BMI is a measurement that tells if you are at a healthy weight. Waist circumference. This measures the distance around your waistline. This measurement also tells if you are at a healthy weight and may help predict your risk of certain diseases, such as type 2 diabetes and high blood pressure. Heart rate and blood pressure. Body temperature. Skin for abnormal spots. What immunizations do I need?  Vaccines are usually given at various ages, according to a schedule. Your health care provider will recommend vaccines for you based on your age, medical history, and lifestyle or other  factors, such as travel or where you work. What tests do I need? Screening Your health care provider may recommend screening tests for certain conditions. This may include: Lipid and cholesterol levels. Diabetes screening. This is done by checking your blood sugar (glucose) after you have not eaten for a while (fasting). Pelvic exam and Pap test. Hepatitis B test. Hepatitis C test. HIV (human immunodeficiency virus) test. STI (sexually transmitted infection) testing, if you are at risk. Lung cancer screening. Colorectal cancer screening. Mammogram. Talk with your health care provider about when you should start having regular mammograms. This may depend on whether you have a family history of breast cancer. BRCA-related cancer screening. This may be done if you have a family history of breast, ovarian, tubal, or peritoneal cancers. Bone density scan. This is done to screen for osteoporosis. Talk with your health care provider about your test results, treatment options, and if necessary, the need for more tests. Follow these instructions at home: Eating and drinking  Eat a diet that includes fresh fruits and vegetables, whole grains, lean protein, and low-fat dairy products. Take vitamin and mineral supplements as recommended by your health care provider. Do not drink alcohol if: Your health care provider tells you not to drink. You are pregnant, may be pregnant, or are planning to become pregnant. If you drink alcohol: Limit how much you have to 0-1 drink a day. Know how much alcohol is in your drink. In the [place] one drink equals one 12 oz bottle of beer (355 mL), one 5 oz glass of wine (148 mL), or one 1 oz glass of hard liquor (44 mL). Lifestyle Brush your teeth every morning  and night with fluoride toothpaste. Floss one time each day. Exercise for at least 30 minutes 5 or more days each week. Do not use any products that contain nicotine or tobacco. These products include  cigarettes, chewing tobacco, and vaping devices, such as e-cigarettes. If you need help quitting, ask your health care provider. Do not use drugs. If you are sexually active, practice safe sex. Use a condom or other form of protection to prevent STIs. If you do not wish to become pregnant, use a form of birth control. If you plan to become pregnant, see your health care provider for a prepregnancy visit. Take aspirin only as told by your health care provider. Make sure that you understand how much to take and what form to take. Work with your health care provider to find out whether it is safe and beneficial for you to take aspirin daily. Find healthy ways to manage stress, such as: Meditation, yoga, or listening to music. Journaling. Talking to a trusted person. Spending time with friends and family. Minimize exposure to UV radiation to reduce your risk of skin cancer. Safety Always wear your seat belt while driving or riding in a vehicle. Do not drive: If you have been drinking alcohol. Do not ride with someone who has been drinking. When you are tired or distracted. While texting. If you have been using any mind-altering substances or drugs. Wear a helmet and other protective equipment during sports activities. If you have firearms in your house, make sure you follow all gun safety procedures. Seek help if you have been physically or sexually abused. What's next? Visit your health care provider once a year for an annual wellness visit. Ask your health care provider how often you should have your eyes and teeth checked. Stay up to date on all vaccines. This information is not intended to replace advice given to you by your health care provider. Make sure you discuss any questions you have with your health care provider. Document Revised: 10/06/2020 Document Reviewed: 10/06/2020 Elsevier Patient Education  [employer].

## 2022-03-13 NOTE — Progress Notes (Signed)
Subjective:    Patient ID: Victoria Wagner, female    DOB: February 22, 1981, 41 y.o.   MRN: [id number]  HPI  Victoria Wagner is a very pleasant 41 y.o. female who presents today for complete physical and follow up of chronic conditions.  Immunizations: -Tetanus: 2019 -Influenza: Due today  Diet: Fair diet.  Exercise: No regular exercise.  Eye exam: Completed several years ago  Dental exam: Completes semi-annually   Pap Smear: Completed per GYN Mammogram: Completed at GYN office.   BP Readings from Last 3 Encounters:  03/13/22 (!) 152/100  10/27/21 134/74  08/11/21 (!) 159/110   She does not check BP at home.     Review of Systems  Constitutional:  Negative for unexpected weight change.  HENT:  Negative for rhinorrhea.   Respiratory:  Negative for shortness of breath.   Cardiovascular:  Negative for chest pain.  Gastrointestinal:  Positive for abdominal pain. Negative for constipation and diarrhea.  Genitourinary:  Negative for difficulty urinating and menstrual problem.  Musculoskeletal:  Positive for arthralgias and myalgias.  Skin:  Negative for rash.  Allergic/Immunologic: Negative for environmental allergies.  Neurological:  Negative for dizziness, numbness and headaches.  Psychiatric/Behavioral:  The patient is not nervous/anxious.          Past Medical History:  Diagnosis Date   Anxiety    Brachial neuritis    Cataract    Post traumatic- per patient   Cervical radiculopathy    Cervical vertigo    Degeneration of cervical intervertebral disc    Depression    Dizziness    episodes of vertigo, 2-3x month triggered by pain in neck   Joint pain    Muscle spasm    neck/shoulder blades r/t cervical fusion   Myalgia    Myofascial pain    Myositis    Neuralgia    Neuritis    Occipital headache    Pain management    Dr. Luisa Hart at [place]   Postpartum care following cesarean delivery (07/31/12) 08/01/2012   Rash and nonspecific skin  eruption 01/31/2019   S/P primary low transverse C-section (07/31/12) 08/01/2012   Swallowing difficulty    improved as 10/18/21 per pt, can be hard to get pills/food down   Weakness of both hands    from cervical fusion   Weakness of both legs    from cervical fusion, patient states that she is able to walk, if she starts feeling weak she lays down, as of 10/18/21    Social History   Socioeconomic History   Marital status: Married    Spouse name: Antyuan    Number of children: 1   Years of education: BA   Highest education level: Not on file  Occupational History   Occupation: Unemployed,disability  Tobacco Use   Smoking status: Never   Smokeless tobacco: Never  Vaping Use   Vaping Use: Never used  Substance and Sexual Activity   Alcohol use: No    Alcohol/week: 0.0 standard drinks of alcohol   Drug use: No   Sexual activity: Not on file    Comment: nuva ring  Other Topics Concern   Not on file  Social History Narrative   Married   Right handed.   One child, daughter.   Enjoys relaxing, spending time with her husband.   Social Determinants of Health   Financial Resource Strain: Not on file  Food Insecurity: Food Insecurity Present (02/06/2018)   Hunger Vital Sign  Worried About Charity fundraiser in the Last Year: Often true    Arboriculturist in the Last Year: Not on file  Transportation Needs: Not on file  Physical Activity: Not on file  Stress: Not on file  Social Connections: Not on file  Intimate Partner Violence: Not on file    Past Surgical History:  Procedure Laterality Date   CERVICAL FUSION  04/2010   C3-C6   CESAREAN SECTION N/A 07/31/2012   Procedure: CESAREAN SECTION;  Surgeon: Lovenia Kim, MD;  Location: [facility];  Service: Obstetrics;  Laterality: N/A;   EDD: 08/18/12   COLONOSCOPY WITH PROPOFOL N/A 12/13/2020   Procedure: COLONOSCOPY WITH PROPOFOL;  Surgeon: Lin Landsman, MD;  Location: [facility] ENDOSCOPY;  Service: Gastroenterology;   Laterality: N/A;   DILITATION & CURRETTAGE/HYSTROSCOPY WITH NOVASURE ABLATION N/A 10/27/2021   Procedure: DILATATION & CURETTAGE/HYSTEROSCOPY WITH NOVASURE ABLATION;  Surgeon: Cheri Fowler, MD;  Location: [place];  Service: Gynecology;  Laterality: N/A;   ECTOPIC PREGNANCY SURGERY  04/2005   ESOPHAGOGASTRODUODENOSCOPY (EGD) WITH PROPOFOL N/A 12/13/2020   Procedure: ESOPHAGOGASTRODUODENOSCOPY (EGD) WITH PROPOFOL;  Surgeon: Lin Landsman, MD;  Location: [place];  Service: Gastroenterology;  Laterality: N/A;   LAPAROSCOPIC BILATERAL SALPINGECTOMY Bilateral 10/27/2021   Procedure: LAPAROSCOPIC BILATERAL SALPINGECTOMY;  Surgeon: Cheri Fowler, MD;  Location: [place];  Service: Gynecology;  Laterality: Bilateral;    History reviewed. No pertinent family history.  No Known Allergies  Current Outpatient Medications on File Prior to Visit  Medication Sig Dispense Refill   Ascorbic Acid (VITAMIN C WITH ROSE HIPS) 1000 MG tablet Take 3,000 mg by mouth daily.     cyclobenzaprine (FLEXERIL) 10 MG tablet Take 1 tablet by mouth 3 (three) times daily.     desonide (DESOWEN) 0.05 % ointment desonide 0.05 % topical ointment 15 g 0   ELDERBERRY PO Take by mouth.     indomethacin (INDOCIN SR) 75 MG CR capsule Take 75 mg by mouth every 12 (twelve) hours.     metroNIDAZOLE (FLAGYL) 500 MG tablet Take 1 tablet (500 mg total) by mouth 2 (two) times daily. 20 tablet 0   Multiple Vitamin (MULTIVITAMIN ADULT PO) Take by mouth. women's     NON FORMULARY Natural supplement for digestion support.     oxcarbazepine (TRILEPTAL) 600 MG tablet Take 300 mg by mouth every 6 (six) hours. For pain     PEPPERMINT OIL PO Take by mouth daily.     Vitamin D-Vitamin K (VITAMIN K2-VITAMIN D3 PO) Take by mouth daily.     ZINC PICOLINATE PO Take by mouth daily.     acetaminophen (TYLENOL) 500 MG tablet Take 500 mg by mouth every 6 (six) hours as needed for mild pain. (Patient not  taking: Reported on 03/13/2022)     No current facility-administered medications on file prior to visit.    BP (!) 152/100   Pulse 97   Temp (!) 97.2 F (36.2 C) (Temporal)   Ht '5\' 6"'$  (1.676 m)   Wt 234 lb (106.1 kg)   SpO2 98%   BMI 37.77 kg/m  Objective:   Physical Exam HENT:     Right Ear: Tympanic membrane and ear canal normal.     Left Ear: Tympanic membrane and ear canal normal.     Nose: Nose normal.  Eyes:     Conjunctiva/sclera: Conjunctivae normal.     Pupils: Pupils are equal, round, and reactive to light.  Neck:  Thyroid: No thyromegaly.  Cardiovascular:     Rate and Rhythm: Normal rate and regular rhythm.     Heart sounds: No murmur heard. Pulmonary:     Effort: Pulmonary effort is normal.     Breath sounds: Normal breath sounds. No rales.  Abdominal:     General: Bowel sounds are normal.     Palpations: Abdomen is soft.     Tenderness: There is no abdominal tenderness.  Musculoskeletal:     Cervical back: Neck supple. Decreased range of motion.     Lumbar back: Decreased range of motion.  Lymphadenopathy:     Cervical: No cervical adenopathy.  Skin:    General: Skin is warm and dry.     Findings: No rash.  Neurological:     Mental Status: She is alert and oriented to person, place, and time.     Cranial Nerves: No cranial nerve deficit.     Deep Tendon Reflexes: Reflexes are normal and symmetric.  Psychiatric:        Mood and Affect: Mood normal.           Assessment & Plan:   Problem List Items Addressed This Visit       Other   Preventative health care - Primary    Immunizations UTD. Influenza vaccine provided today. Pap smear and mammogram UTD. Follows with GYN.  Discussed the importance of a healthy diet and regular exercise in order for weight loss, and to reduce the risk of further co-morbidity.  Exam stable. Labs pending.  Follow up in 1 year for repeat physical.       Elevated blood pressure reading    Above goal  today, also on recheck.   I have asked that she start monitoring BP at home and send readings via MyChart in a few weeks.       Chronic pain    Overall controlled.  Continue indomethacin 75 mg BID, cyclobenzaprine 10 mg PRn, Oxtellar XR 300 mg TID. Following with orthopedics.       Prediabetes    Repeat A1C pending.  Discussed the importance of a healthy diet and regular exercise in order for weight loss, and to reduce the risk of further co-morbidity.       Relevant Orders   Hemoglobin A1c   Lipid panel   Comprehensive metabolic panel   Abdominal bloating    Chronic and continued.  Following with GI, office notes reviewed from April 2023. Continue Flagyl 500 mg BID which is a new addition.         Other Visit Diagnoses     Screening for HIV (human immunodeficiency virus)       Relevant Orders   HIV antibody (with reflex)   Encounter for hepatitis C screening test for low risk patient       Relevant Orders   Hepatitis C Antibody   Need for immunization against influenza       Relevant Orders   Flu Vaccine QUAD 25moIM (Fluarix, Fluzone & Alfiuria Quad PF) (Completed)          KPleas Koch NP

## 2022-03-13 NOTE — Assessment & Plan Note (Signed)
Above goal today, also on recheck.   I have asked that she start monitoring BP at home and send readings via MyChart in a few weeks.

## 2022-03-13 NOTE — Assessment & Plan Note (Signed)
Chronic and continued.  Following with GI, office notes reviewed from April 2023. Continue Flagyl 500 mg BID which is a new addition.

## 2022-03-13 NOTE — Assessment & Plan Note (Signed)
Overall controlled.  Continue indomethacin 75 mg BID, cyclobenzaprine 10 mg PRn, Oxtellar XR 300 mg TID. Following with orthopedics.

## 2022-03-13 NOTE — Assessment & Plan Note (Signed)
Repeat A1C pending.  Discussed the importance of a healthy diet and regular exercise in order for weight loss, and to reduce the risk of further co-morbidity.  

## 2022-03-13 NOTE — Assessment & Plan Note (Signed)
Immunizations UTD. Influenza vaccine provided today. Pap smear and mammogram UTD. Follows with GYN.  Discussed the importance of a healthy diet and regular exercise in order for weight loss, and to reduce the risk of further co-morbidity.  Exam stable. Labs pending.  Follow up in 1 year for repeat physical.

## 2022-03-13 NOTE — Telephone Encounter (Signed)
We do not have any enough samples for a full length of medication

## 2022-03-14 LAB — HEPATITIS C ANTIBODY: Hepatitis C Ab: NONREACTIVE

## 2022-03-14 LAB — HIV ANTIBODY (ROUTINE TESTING W REFLEX): HIV 1&2 Ab, 4th Generation: NONREACTIVE

## 2022-03-24 ENCOUNTER — Ambulatory Visit (INDEPENDENT_AMBULATORY_CARE_PROVIDER_SITE_OTHER): Payer: Commercial Managed Care - PPO | Admitting: Primary Care

## 2022-03-24 VITALS — BP 156/100 | HR 95 | Temp 97.9°F | Ht 66.0 in | Wt 233.2 lb

## 2022-03-24 DIAGNOSIS — I10 Essential (primary) hypertension: Secondary | ICD-10-CM

## 2022-03-24 NOTE — Assessment & Plan Note (Addendum)
Readings above goal today, also during multiple prior visits that seem to have become more frequent in 2023.  Recommended treatment for which she kindly declined. Also recommended she track her BP daily at home for the next 2 weeks, she agrees and will send via [place].  She will work on weight loss, is motivated.   Consider treatment if readings are still above goal.   Will continue to assess for sleep apnea.

## 2022-03-24 NOTE — Progress Notes (Signed)
Subjective:    Patient ID: Victoria Wagner, female    DOB: [birth year], 41 y.o.   MRN: [id number]  Hypertension Pertinent negatives include no chest pain, headaches or shortness of breath.    Elliannah G Mulvehill is a very pleasant 41 y.o. female with a history of elevated blood pressure readings, new onset type 2 diabetes, chronic pain  who presents today to discuss blood pressure readings.  She was last evaluated on 03/13/22 for her annual exam. Blood pressure was noted to be above goal during this visit, also during prior visits. She was asked to monitor BP at home and report readings in a few weeks.  She's checked her blood pressure once since her last visit, yesterday was 182/120. She denies a family history of hypertension. She denies headaches, dizziness, increased fatigue.   She has been experiencing difficulty sleeping this week because of her chronic pain. She does snore at night sometimes. She doesn't recall her husband saying that she stops breathing during the night.  She is managed on oral and IM steroids every few months for her chronic pain.   BP Readings from Last 3 Encounters:  03/24/22 (!) 156/100  03/13/22 (!) 152/100  10/27/21 134/74   Wt Readings from Last 3 Encounters:  03/24/22 233 lb 3.2 oz (105.8 kg)  03/13/22 234 lb (106.1 kg)  10/27/21 232 lb 4.8 oz (105.4 kg)        Review of Systems  Eyes:  Negative for visual disturbance.  Respiratory:  Negative for shortness of breath.   Cardiovascular:  Negative for chest pain.  Neurological:  Negative for dizziness and headaches.         Past Medical History:  Diagnosis Date   Anxiety    Brachial neuritis    Cataract    Post traumatic- per patient   Cervical radiculopathy    Cervical vertigo    Degeneration of cervical intervertebral disc    Depression    Dizziness    episodes of vertigo, 2-3x month triggered by pain in neck   Joint pain    Muscle spasm    neck/shoulder blades r/t cervical fusion    Myalgia    Myofascial pain    Myositis    Neuralgia    Neuritis    Occipital headache    Pain management    Dr. Luisa Hart at [place]   Postpartum care following cesarean delivery (07/31/12) 08/01/2012   Rash and nonspecific skin eruption 01/31/2019   S/P primary low transverse C-section (07/31/12) 08/01/2012   Swallowing difficulty    improved as 10/18/21 per pt, can be hard to get pills/food down   Weakness of both hands    from cervical fusion   Weakness of both legs    from cervical fusion, patient states that she is able to walk, if she starts feeling weak she lays down, as of 10/18/21    Social History   Socioeconomic History   Marital status: Married    Spouse name: Antyuan    Number of children: 1   Years of education: BA   Highest education level: Not on file  Occupational History   Occupation: Unemployed,disability  Tobacco Use   Smoking status: Never   Smokeless tobacco: Never  Vaping Use   Vaping Use: Never used  Substance and Sexual Activity   Alcohol use: No    Alcohol/week: 0.0 standard drinks of alcohol   Drug use: No   Sexual activity: Not on file  Comment: nuva ring  Other Topics Concern   Not on file  Social History Narrative   Married   Right handed.   One child, daughter.   Enjoys relaxing, spending time with her husband.   Social Determinants of Health   Financial Resource Strain: Not on file  Food Insecurity: Food Insecurity Present (02/06/2018)   Hunger Vital Sign    Worried About Running Out of Food in the Last Year: Often true    Ran Out of Food in the Last Year: Not on file  Transportation Needs: Not on file  Physical Activity: Not on file  Stress: Not on file  Social Connections: Not on file  Intimate Partner Violence: Not on file    Past Surgical History:  Procedure Laterality Date   CERVICAL FUSION  04/2010   C3-C6   CESAREAN SECTION N/A 07/31/2012   Procedure: CESAREAN SECTION;  Surgeon: Lovenia Kim,  MD;  Location: [facility];  Service: Obstetrics;  Laterality: N/A;   EDD: 08/18/12   COLONOSCOPY WITH PROPOFOL N/A 12/13/2020   Procedure: COLONOSCOPY WITH PROPOFOL;  Surgeon: Lin Landsman, MD;  Location[facility] ENDOSCOPY;  Service: Gastroenterology;  Laterality: N/A;   DILITATION & CURRETTAGE/HYSTROSCOPY WITH NOVASURE ABLATION N/A 10/27/2021   Procedure: DILATATION & CURETTAGE/HYSTEROSCOPY WITH NOVASURE ABLATION;  Surgeon: Cheri Fowler, MD;  Location: [place];  Service: Gynecology;  Laterality: N/A;   ECTOPIC PREGNANCY SURGERY  04/2005   ESOPHAGOGASTRODUODENOSCOPY (EGD) WITH PROPOFOL N/A 12/13/2020   Procedure: ESOPHAGOGASTRODUODENOSCOPY (EGD) WITH PROPOFOL;  Surgeon: Lin Landsman, MD;  Location: [place];  Service: Gastroenterology;  Laterality: N/A;   LAPAROSCOPIC BILATERAL SALPINGECTOMY Bilateral 10/27/2021   Procedure: LAPAROSCOPIC BILATERAL SALPINGECTOMY;  Surgeon: Cheri Fowler, MD;  Location: [place];  Service: Gynecology;  Laterality: Bilateral;    No family history on file.  No Known Allergies  Current Outpatient Medications on File Prior to Visit  Medication Sig Dispense Refill   acetaminophen (TYLENOL) 500 MG tablet Take 500 mg by mouth every 6 (six) hours as needed for mild pain.     Ascorbic Acid (VITAMIN C WITH ROSE HIPS) 1000 MG tablet Take 3,000 mg by mouth daily.     cyclobenzaprine (FLEXERIL) 10 MG tablet Take 1 tablet by mouth 3 (three) times daily.     ELDERBERRY PO Take by mouth.     indomethacin (INDOCIN SR) 75 MG CR capsule Take 75 mg by mouth every 12 (twelve) hours.     Multiple Vitamin (MULTIVITAMIN ADULT PO) Take by mouth. women's     NON FORMULARY Natural supplement for digestion support.     oxcarbazepine (TRILEPTAL) 600 MG tablet Take 300 mg by mouth every 6 (six) hours. For pain     PEPPERMINT OIL PO Take by mouth daily.     Vitamin D-Vitamin K (VITAMIN K2-VITAMIN D3 PO) Take by mouth daily.     ZINC  PICOLINATE PO Take by mouth daily.     No current facility-administered medications on file prior to visit.    BP (!) 156/100   Pulse 95   Temp 97.9 F (36.6 C) (Oral)   Ht '5\' 6"'$  (1.676 m)   Wt 233 lb 3.2 oz (105.8 kg)   SpO2 98%   BMI 37.64 kg/m  Objective:   Physical Exam Cardiovascular:     Rate and Rhythm: Normal rate and regular rhythm.  Pulmonary:     Effort: Pulmonary effort is normal.  Musculoskeletal:     Cervical back: Neck supple.  Skin:  General: Skin is warm and dry.           Assessment & Plan:   Problem List Items Addressed This Visit       Cardiovascular and Mediastinum   Essential hypertension - Primary    Readings above goal today, also during multiple prior visits that seem to have become more frequent in 2023.  Recommended treatment for which she kindly declined. Also recommended she track her BP daily at home for the next 2 weeks, she agrees and will send via [place].  Consider treatment if readings are still above goal.   Will continue to assess for sleep apnea.           Pleas Koch, NP

## 2022-04-04 ENCOUNTER — Encounter: Payer: Self-pay | Admitting: Gastroenterology

## 2022-05-12 ENCOUNTER — Encounter: Payer: Self-pay | Admitting: Primary Care

## 2022-05-12 ENCOUNTER — Ambulatory Visit: Payer: Commercial Managed Care - PPO | Admitting: Primary Care

## 2022-05-12 VITALS — BP 148/96 | HR 123 | Temp 97.3°F | Ht 66.0 in | Wt 227.0 lb

## 2022-05-12 DIAGNOSIS — M7660 Achilles tendinitis, unspecified leg: Secondary | ICD-10-CM | POA: Diagnosis not present

## 2022-05-12 NOTE — Progress Notes (Signed)
Subjective:    Patient ID: Victoria Wagner, female    DOB: [birth year], 42 y.o.   MRN: [id number]  Injury Pertinent negatives include no numbness.    Victoria Wagner is a very pleasant 42 y.o. female with a history of chronic pain who presents today to discuss achilles pain.   She was rising quickly out of her chair this morning when she heard and felt a sudden painful "pop" to the left posterior lower achilles region. She sat back down immediately. Since then she's experienced a reduction in ROM to her left foot and ankle due to moderate to severe pain. She is unable to put any weight on her left lower extremity and she could not put her shoe on. She has noticed a dimple into the skin of her left posterior achilles.   Her pain is constant for which she describes as throbbing and aching. She is using her husbands crutches today. She denies numbness/tingling, flaccidity of her left foot.    Review of Systems  Musculoskeletal:  Positive for myalgias.  Skin:  Negative for color change.  Neurological:  Negative for numbness.         Past Medical History:  Diagnosis Date   Anxiety    Brachial neuritis    Cataract    Post traumatic- per patient   Cervical radiculopathy    Cervical vertigo    Degeneration of cervical intervertebral disc    Depression    Dizziness    episodes of vertigo, 2-3x month triggered by pain in neck   Joint pain    Muscle spasm    neck/shoulder blades r/t cervical fusion   Myalgia    Myofascial pain    Myositis    Neuralgia    Neuritis    Occipital headache    Pain management    Dr. Luisa Hart at [place]   Postpartum care following cesarean delivery (07/31/12) 08/01/2012   Rash and nonspecific skin eruption 01/31/2019   S/P primary low transverse C-section (07/31/12) 08/01/2012   Swallowing difficulty    improved as 10/18/21 per pt, can be hard to get pills/food down   Weakness of both hands    from cervical fusion   Weakness of  both legs    from cervical fusion, patient states that she is able to walk, if she starts feeling weak she lays down, as of 10/18/21    Social History   Socioeconomic History   Marital status: Married    Spouse name: Antyuan    Number of children: 1   Years of education: BA   Highest education level: Not on file  Occupational History   Occupation: Unemployed,disability  Tobacco Use   Smoking status: Never   Smokeless tobacco: Never  Vaping Use   Vaping Use: Never used  Substance and Sexual Activity   Alcohol use: No    Alcohol/week: 0.0 standard drinks of alcohol   Drug use: No   Sexual activity: Not on file    Comment: nuva ring  Other Topics Concern   Not on file  Social History Narrative   Married   Right handed.   One child, daughter.   Enjoys relaxing, spending time with her husband.   Social Determinants of Health   Financial Resource Strain: Not on file  Food Insecurity: Food Insecurity Present (02/06/2018)   Hunger Vital Sign    Worried About Running Out of Food in the Last Year: Often true    Ran Out of  Food in the Last Year: Not on file  Transportation Needs: Not on file  Physical Activity: Not on file  Stress: Not on file  Social Connections: Not on file  Intimate Partner Violence: Not on file    Past Surgical History:  Procedure Laterality Date   CERVICAL FUSION  04/2010   C3-C6   CESAREAN SECTION N/A 07/31/2012   Procedure: CESAREAN SECTION;  Surgeon: Lovenia Kim, MD;  Location: [facility];  Service: Obstetrics;  Laterality: N/A;   EDD: 08/18/12   COLONOSCOPY WITH PROPOFOL N/A 12/13/2020   Procedure: COLONOSCOPY WITH PROPOFOL;  Surgeon: Lin Landsman, MD;  Location[facility] ENDOSCOPY;  Service: Gastroenterology;  Laterality: N/A;   DILITATION & CURRETTAGE/HYSTROSCOPY WITH NOVASURE ABLATION N/A 10/27/2021   Procedure: DILATATION & CURETTAGE/HYSTEROSCOPY WITH NOVASURE ABLATION;  Surgeon: Cheri Fowler, MD;  Location: [place];   Service: Gynecology;  Laterality: N/A;   ECTOPIC PREGNANCY SURGERY  04/2005   ESOPHAGOGASTRODUODENOSCOPY (EGD) WITH PROPOFOL N/A 12/13/2020   Procedure: ESOPHAGOGASTRODUODENOSCOPY (EGD) WITH PROPOFOL;  Surgeon: Lin Landsman, MD;  Location: [place];  Service: Gastroenterology;  Laterality: N/A;   LAPAROSCOPIC BILATERAL SALPINGECTOMY Bilateral 10/27/2021   Procedure: LAPAROSCOPIC BILATERAL SALPINGECTOMY;  Surgeon: Cheri Fowler, MD;  Location: [place];  Service: Gynecology;  Laterality: Bilateral;    History reviewed. No pertinent family history.  No Known Allergies  Current Outpatient Medications on File Prior to Visit  Medication Sig Dispense Refill   acetaminophen (TYLENOL) 500 MG tablet Take 500 mg by mouth every 6 (six) hours as needed for mild pain.     Ascorbic Acid (VITAMIN C WITH ROSE HIPS) 1000 MG tablet Take 3,000 mg by mouth daily.     cyclobenzaprine (FLEXERIL) 10 MG tablet Take 1 tablet by mouth 3 (three) times daily.     ELDERBERRY PO Take by mouth.     indomethacin (INDOCIN SR) 75 MG CR capsule Take 75 mg by mouth every 12 (twelve) hours.     Multiple Vitamin (MULTIVITAMIN ADULT PO) Take by mouth. women's     NON FORMULARY Natural supplement for digestion support.     oxcarbazepine (TRILEPTAL) 600 MG tablet Take 300 mg by mouth every 6 (six) hours. For pain     PEPPERMINT OIL PO Take by mouth daily.     Vitamin D-Vitamin K (VITAMIN K2-VITAMIN D3 PO) Take by mouth daily.     ZINC PICOLINATE PO Take by mouth daily.     OXcarbazepine ER (OXTELLAR XR) 300 MG TB24 1 tablet on an empty stomach Orally Twice a day     No current facility-administered medications on file prior to visit.    BP (!) 148/96   Pulse (!) 123   Temp (!) 97.3 F (36.3 C) (Temporal)   Ht '5\' 6"'$  (1.676 m)   Wt 227 lb (103 kg)   SpO2 95%   BMI 36.64 kg/m  Objective:   Physical Exam Pulmonary:     Effort: Pulmonary effort is normal.  Musculoskeletal:     Left ankle:  Swelling present. Tenderness present. Decreased range of motion.     Left Achilles Tendon: Tenderness present.       Legs:     Comments: Decrease in ROM due to pain to left foot with plantar and dorsal flexion.   Skin:    General: Skin is warm and dry.  Neurological:     Mental Status: She is alert.           Assessment & Plan:  Achilles tendon pain Assessment & Plan: Partial rupture suspected today.  Patient fitted with CAM walker boot with heel lifts, immediate improvement felt by patient.  Discussed home care instructions with use of ice and NSAID's.   Close follow up with Sports medicine for next week.           Pleas Koch, NP

## 2022-05-12 NOTE — Assessment & Plan Note (Signed)
Partial rupture suspected today.  Patient fitted with CAM walker boot with heel lifts, immediate improvement felt by patient.  Discussed home care instructions with use of ice and NSAID's.   Close follow up with Sports medicine for next week.

## 2022-05-16 NOTE — Progress Notes (Unsigned)
    Victoria Seeling T. Sharnice Bosler, MD, Victoria Wagner, Victoria Wagner  Phone: [phone]  FAX: [phone]  Victoria Wagner - 42 y.o. female  MRN [id number]  Date of Birth: Jul 11, 1980  Date: 05/17/2022  PCP: Victoria Koch, Victoria Wagner  Referral: Victoria Koch, Victoria Wagner  No chief complaint on file.  Subjective:   Victoria Wagner is a 42 y.o. very pleasant female patient with There is no height or weight on file to calculate BMI. who presents with the following:  Patient presents with acute Achilles tendon pain, she is seen in follow-up from Dr. Carlis Abbott where she was seen on May 12, 2022, and at that point she was placed in a cam walker boot with a heel lift.  There was a question of partial Achilles rupture.    Review of Systems is noted in the HPI, as appropriate  Objective:   There were no vitals taken for this visit.  GEN: No acute distress; alert,appropriate. PULM: Breathing comfortably in no respiratory distress PSYCH: Normally interactive.   Laboratory and Imaging Data:  Assessment and Plan:   ***

## 2022-05-17 ENCOUNTER — Encounter: Payer: Self-pay | Admitting: *Deleted

## 2022-05-17 ENCOUNTER — Encounter: Payer: Self-pay | Admitting: Family Medicine

## 2022-05-17 ENCOUNTER — Ambulatory Visit: Payer: Commercial Managed Care - PPO | Admitting: Family Medicine

## 2022-05-17 VITALS — BP 140/90 | HR 97 | Temp 98.3°F | Ht 66.0 in | Wt 229.4 lb

## 2022-05-17 DIAGNOSIS — S86012A Strain of left Achilles tendon, initial encounter: Secondary | ICD-10-CM | POA: Diagnosis not present

## 2022-05-17 DIAGNOSIS — M7660 Achilles tendinitis, unspecified leg: Secondary | ICD-10-CM

## 2022-06-26 ENCOUNTER — Encounter: Payer: Self-pay | Admitting: Family Medicine

## 2022-06-26 ENCOUNTER — Ambulatory Visit: Payer: Commercial Managed Care - PPO | Admitting: Family Medicine

## 2022-06-26 VITALS — BP 134/84 | HR 99 | Temp 97.4°F | Ht 66.0 in | Wt 228.2 lb

## 2022-06-26 DIAGNOSIS — J040 Acute laryngitis: Secondary | ICD-10-CM | POA: Diagnosis not present

## 2022-06-26 DIAGNOSIS — J22 Unspecified acute lower respiratory infection: Secondary | ICD-10-CM | POA: Diagnosis not present

## 2022-06-26 DIAGNOSIS — J209 Acute bronchitis, unspecified: Secondary | ICD-10-CM | POA: Diagnosis not present

## 2022-06-26 LAB — POC COVID19 BINAXNOW: SARS Coronavirus 2 Ag: NEGATIVE

## 2022-06-26 MED ORDER — CHERATUSSIN AC 100-10 MG/5ML PO SOLN: 5.0000 mL | Freq: Three times a day (TID) | ORAL | 0 refills | Status: DC | PRN

## 2022-06-26 NOTE — Patient Instructions (Addendum)
You have a respiratory infection associated with bronchitis and laryngitis, likely viral process. Antibiotics are not needed for this. Viral infections usually take 7-10 days to resolve. The cough can last a few weeks to go away. Use medication as prescribed: cheratussin cough syrup May take over the counter plain mucinex with plenty of water to break up mucous. .  May take tylenol '500mg'$  2-3 times daily alternating with ibuprofen '600mg'$  with meals. Hold indomethacin if you take ibuprofen.  Push fluids and plenty of rest. Please return or let us know if you are not improving as expected, or if you have high fevers (>101.5) or difficulty swallowing or worsening productive cough. Call clinic with questions.  bI hope you start feeling better soon.

## 2022-06-26 NOTE — Progress Notes (Signed)
Patient ID: Victoria Wagner, female    DOB: [birth year], 42 y.o.   MRN: [id number]  This visit was conducted in person.  BP 134/84   Pulse 99   Temp (!) 97.4 F (36.3 C) (Temporal)   Ht '5\' 6"'$  (1.676 m)   Wt 228 lb 4 oz (103.5 kg) Comment: Wearing boot on L leg  SpO2 97%   BMI 36.84 kg/m    CC: cough Subjective:   HPI: Victoria Wagner is a 42 y.o. female presenting on 06/26/2022 for Cough (C/o cough and hoarseness. Sxs started 06/24/22. )   3d h/o cough associated with hoarseness. Cough can be productive of mucous. Headache as well as body aches due to cough.  Coughing fits leading to chest pain and rib pain, post tussive gagging.  Chest > head congestion.  No fevers/chills, ear or tooth pain, nausea, PNdrainage. No h/o asthma Non smoker  No known sick contacts.  So far has treated cough with dayquil/nyquil, as well as Rx tessalon perls with some benefit.   On oxcarbazepine, indomethacin and flexeril for chronic neck pain s/p fusion.  Recent achilles injury - followed by ortho, continues wearing boot.   No prior COVID infection. She has received 3 Pfizer vaccines.       Relevant past medical, surgical, family and social history reviewed and updated as indicated. Interim medical history since our last visit reviewed. Allergies and medications reviewed and updated. Outpatient Medications Prior to Visit  Medication Sig Dispense Refill   acetaminophen (TYLENOL) 500 MG tablet Take 500 mg by mouth every 6 (six) hours as needed for mild pain.     Ascorbic Acid (VITAMIN C WITH ROSE HIPS) 1000 MG tablet Take 3,000 mg by mouth daily.     cyclobenzaprine (FLEXERIL) 10 MG tablet Take 1 tablet by mouth 3 (three) times daily.     ELDERBERRY PO Take by mouth.     indomethacin (INDOCIN SR) 75 MG CR capsule Take 75 mg by mouth every 12 (twelve) hours.     Multiple Vitamin (MULTIVITAMIN ADULT PO) Take by mouth. women's     NON FORMULARY Natural supplement for digestion support.      oxcarbazepine (TRILEPTAL) 600 MG tablet Take 300 mg by mouth every 6 (six) hours. For pain     OXcarbazepine ER (OXTELLAR XR) 300 MG TB24 1 tablet on an empty stomach Orally Twice a day     PEPPERMINT OIL PO Take by mouth daily.     Vitamin D-Vitamin K (VITAMIN K2-VITAMIN D3 PO) Take by mouth daily.     ZINC PICOLINATE PO Take by mouth daily.     No facility-administered medications prior to visit.     Per HPI unless specifically indicated in ROS section below Review of Systems  Objective:  BP 134/84   Pulse 99   Temp (!) 97.4 F (36.3 C) (Temporal)   Ht '5\' 6"'$  (1.676 m)   Wt 228 lb 4 oz (103.5 kg) Comment: Wearing boot on L leg  SpO2 97%   BMI 36.84 kg/m   Wt Readings from Last 3 Encounters:  06/26/22 228 lb 4 oz (103.5 kg)  05/17/22 229 lb 6 oz (104 kg)  05/12/22 227 lb (103 kg)      Physical Exam Vitals and nursing note reviewed.  Constitutional:      Appearance: Normal appearance. She is not ill-appearing.  HENT:     Head: Normocephalic and atraumatic.     Right Ear: Tympanic membrane, ear canal and  external ear normal. There is no impacted cerumen.     Left Ear: Tympanic membrane, ear canal and external ear normal. There is no impacted cerumen.     Mouth/Throat:     Mouth: Mucous membranes are moist.     Pharynx: Oropharynx is clear. Posterior oropharyngeal erythema (mild posterior oropharyngeal) present. No oropharyngeal exudate.     Comments: Hoarse voice Eyes:     Extraocular Movements: Extraocular movements intact.     Conjunctiva/sclera: Conjunctivae normal.     Pupils: Pupils are equal, round, and reactive to light.  Cardiovascular:     Rate and Rhythm: Normal rate and regular rhythm.     Pulses: Normal pulses.     Heart sounds: Normal heart sounds. No murmur heard. Pulmonary:     Effort: Pulmonary effort is normal. No respiratory distress.     Breath sounds: Rhonchi (few bibasilar) present. No wheezing or rales.  Lymphadenopathy:     Head:     Right  side of head: No submental, submandibular, tonsillar, preauricular or posterior auricular adenopathy.     Left side of head: No submental, submandibular, tonsillar, preauricular or posterior auricular adenopathy.     Cervical: No cervical adenopathy.     Right cervical: No superficial cervical adenopathy.    Left cervical: No superficial cervical adenopathy.     Upper Body:     Right upper body: No supraclavicular adenopathy.     Left upper body: No supraclavicular adenopathy.  Skin:    Findings: No rash.  Neurological:     Mental Status: She is alert.  Psychiatric:        Mood and Affect: Mood normal.        Behavior: Behavior normal.       Results for orders placed or performed in visit on 06/26/22  POC COVID-19 BinaxNow  Result Value Ref Range   SARS Coronavirus 2 Ag Negative Negative   Lab Results  Component Value Date   HGBA1C 6.7 (H) 03/13/2022    Lab Results  Component Value Date   CREATININE 0.87 03/13/2022   BUN 10 03/13/2022   NA 138 03/13/2022   K 3.4 (L) 03/13/2022   CL 101 03/13/2022   CO2 29 03/13/2022   Assessment & Plan:   Problem List Items Addressed This Visit     Acute respiratory infection - Primary    Anticipate viral process associated with acute bronchitis and laryngitis.  Supportive measures reviewed.  Rx cheratussin with sedation precautions.  Discussed NSAID use - if used, hold indocin.  Update if not improving with treatment.       Relevant Orders   POC COVID-19 BinaxNow (Completed)   Other Visit Diagnoses     Acute bronchitis, unspecified organism       Acute laryngitis            Meds ordered this encounter  Medications   guaiFENesin-codeine (CHERATUSSIN AC) 100-10 MG/5ML syrup    Sig: Take 5 mLs by mouth 3 (three) times daily as needed for cough (sedation precautions).    Dispense:  120 mL    Refill:  0    Orders Placed This Encounter  Procedures   POC COVID-19 BinaxNow    Order Specific Question:   Previously tested  for COVID-19    Answer:   No    Order Specific Question:   Resident in a congregate (group) care setting    Answer:   No    Order Specific Question:   Employed in healthcare  setting    Answer:   No    Order Specific Question:   Pregnant    Answer:   No    Patient Instructions  You have a respiratory infection associated with bronchitis and laryngitis, likely viral process. Antibiotics are not needed for this. Viral infections usually take 7-10 days to resolve. The cough can last a few weeks to go away. Use medication as prescribed: cheratussin cough syrup May take over the counter plain mucinex with plenty of water to break up mucous. .  May take tylenol '500mg'$  2-3 times daily alternating with ibuprofen '600mg'$  with meals. Hold indomethacin if you take ibuprofen.  Push fluids and plenty of rest. Please return or let us know if you are not improving as expected, or if you have high fevers (>101.5) or difficulty swallowing or worsening productive cough. Call clinic with questions.  bI hope you start feeling better soon.   Follow up plan: No follow-ups on file.  Ria Bush, MD

## 2022-06-26 NOTE — Assessment & Plan Note (Addendum)
Anticipate viral process associated with acute bronchitis and laryngitis.  Supportive measures reviewed.  Rx cheratussin with sedation precautions.  Discussed NSAID use - if used, hold indocin.  Update if not improving with treatment.

## 2022-06-27 MED ORDER — HYDROCOD POLI-CHLORPHE POLI ER 10-8 MG/5ML PO SUER: 5.0000 mL | Freq: Two times a day (BID) | ORAL | 0 refills | Status: DC | PRN

## 2022-06-27 NOTE — Telephone Encounter (Signed)
Patient called in and stated that she seen Dr. Darnell Level. She stated that the meds are on back order and her cough is getting worse. She is wanting to know what she should do. Informed her that he is busy seeing patients this morning and her mychart message was sent back to him.

## 2022-06-27 NOTE — Telephone Encounter (Signed)
[place] CSRS reviewed  ?

## 2022-06-28 MED ORDER — BENZONATATE 100 MG PO CAPS: 100.0000 mg | ORAL_CAPSULE | Freq: Three times a day (TID) | ORAL | 0 refills | Status: DC | PRN

## 2022-06-28 MED ORDER — PROMETHAZINE-DM 6.25-15 MG/5ML PO SYRP: 2.5000 mL | ORAL_SOLUTION | Freq: Three times a day (TID) | ORAL | 0 refills | Status: DC | PRN

## 2022-06-28 NOTE — Addendum Note (Signed)
Addended by: Ria Bush on: 06/28/2022 05:34 PM   Modules accepted: Orders

## 2023-01-09 ENCOUNTER — Other Ambulatory Visit: Payer: Self-pay

## 2023-01-09 ENCOUNTER — Emergency Department (HOSPITAL_COMMUNITY)
Admission: EM | Admit: 2023-01-09 | Discharge: 2023-01-09 | Disposition: A | Discharge status: Home / Self Care | Payer: Commercial Managed Care - PPO | Attending: Emergency Medicine | Admitting: Emergency Medicine

## 2023-01-09 ENCOUNTER — Emergency Department (HOSPITAL_COMMUNITY): Payer: Commercial Managed Care - PPO

## 2023-01-09 ENCOUNTER — Encounter (HOSPITAL_COMMUNITY): Payer: Self-pay

## 2023-01-09 DIAGNOSIS — M542 Cervicalgia: Secondary | ICD-10-CM | POA: Diagnosis not present

## 2023-01-09 DIAGNOSIS — S0003XA Contusion of scalp, initial encounter: Secondary | ICD-10-CM | POA: Insufficient documentation

## 2023-01-09 DIAGNOSIS — Y92410 Unspecified street and highway as the place of occurrence of the external cause: Secondary | ICD-10-CM | POA: Diagnosis not present

## 2023-01-09 DIAGNOSIS — S0990XA Unspecified injury of head, initial encounter: Secondary | ICD-10-CM | POA: Diagnosis present

## 2023-01-09 DIAGNOSIS — S20319A Abrasion of unspecified front wall of thorax, initial encounter: Secondary | ICD-10-CM | POA: Insufficient documentation

## 2023-01-09 MED ORDER — ACETAMINOPHEN 500 MG PO TABS
1000.0000 mg | ORAL_TABLET | Freq: Once | ORAL | Status: AC
  Administered 2023-01-09: 1000 mg via ORAL
  Filled 2023-01-09: qty 2

## 2023-01-09 MED ORDER — IBUPROFEN 800 MG PO TABS
800.0000 mg | ORAL_TABLET | Freq: Once | ORAL | Status: AC
  Administered 2023-01-09: 800 mg via ORAL
  Filled 2023-01-09: qty 1

## 2023-01-09 MED ORDER — OXYCODONE HCL 5 MG PO TABS
5.0000 mg | ORAL_TABLET | Freq: Once | ORAL | Status: DC
  Filled 2023-01-09: qty 1
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
## 2023-01-09 NOTE — Discharge Instructions (Signed)
You will hurt worse tomorrow that is normal.  Please follow-up with your family doctor in the office.  Please return for sudden worsening headache confusion or vomiting.  Take 4 over the counter ibuprofen tablets 3 times a day or 2 over-the-counter naproxen tablets twice a day for pain. Also take tylenol 1000mg (2 extra strength) four times a day.
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
## 2023-01-11 ENCOUNTER — Ambulatory Visit
Admission: EM | Admit: 2023-01-11 | Discharge: 2023-01-11 | Disposition: A | Discharge status: Home / Self Care | Payer: Commercial Managed Care - PPO | Attending: Family Medicine | Admitting: Family Medicine

## 2023-01-11 ENCOUNTER — Ambulatory Visit (INDEPENDENT_AMBULATORY_CARE_PROVIDER_SITE_OTHER): Payer: Commercial Managed Care - PPO

## 2023-01-11 DIAGNOSIS — M79601 Pain in right arm: Secondary | ICD-10-CM | POA: Diagnosis not present

## 2023-01-11 MED ORDER — KETOROLAC TROMETHAMINE 10 MG PO TABS: 10.0000 mg | ORAL_TABLET | Freq: Four times a day (QID) | ORAL | 0 refills | Status: AC | PRN

## 2023-01-11 MED ORDER — KETOROLAC TROMETHAMINE 30 MG/ML IJ SOLN
30.0000 mg | Freq: Once | INTRAMUSCULAR | Status: AC
  Administered 2023-01-11: 30 mg via INTRAMUSCULAR

## 2023-01-11 NOTE — Discharge Instructions (Addendum)
The x-ray is normal by my review. The radiologist will also read your x-ray, and if their interpretation differs significantly from mine, we will call you.  I think the tendons and tissues in your forearm are bruised, and that is why it is hurting when you move your fingers, as those tendons that move your fingers are in your forearm.   You have been given a shot of Toradol 30 mg today.  Ketorolac 10 mg tablets--take 1 tablet every 6 hours as needed for pain.  This is the same medicine that is in the shot we just gave you  Stop taking ibuprofen  I am glad you are going to follow-up with your primary care
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
## 2023-01-16 ENCOUNTER — Ambulatory Visit: Payer: Commercial Managed Care - PPO | Admitting: Primary Care

## 2023-01-16 ENCOUNTER — Encounter: Payer: Self-pay | Admitting: Primary Care

## 2023-01-16 VITALS — BP 130/82 | HR 105 | Temp 98.1°F | Ht 66.0 in | Wt 229.0 lb

## 2023-01-16 DIAGNOSIS — G8929 Other chronic pain: Secondary | ICD-10-CM

## 2023-01-16 DIAGNOSIS — M542 Cervicalgia: Secondary | ICD-10-CM

## 2023-01-16 DIAGNOSIS — S0510XS Contusion of eyeball and orbital tissues, unspecified eye, sequela: Secondary | ICD-10-CM | POA: Diagnosis not present

## 2023-01-16 NOTE — Assessment & Plan Note (Signed)
Reviewed ED and urgent care notes from 01/09/2023 and 01/11/2023. Reviewed CT head and CT C-spine from 01/11/2023 per ED.  Seems to have mild concussive symptoms.  Neuro exam today unremarkable.  The orbital ecchymosis could be secondary to the large hematoma to her left frontal lobe.  Negative for skull fracture on CT head. Recommended she update her eye doctor regarding her eye changes.  She will follow-up tomorrow with her orthopedist and update after this visit.  Continue cyclobenzaprine 10 mg 3 times daily as needed, indomethacin and Oxtellar as prescribed. Consider prednisone for joint inflammation and swelling.  Await update from orthopedist visit.
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
## 2023-01-16 NOTE — Patient Instructions (Signed)
Please update me tomorrow after your visit with the orthopedic doctor.  Try to walk and stretch when able.  Notify me if your headaches/dizziness symptoms increase.  It was a pleasure to see you today!

## 2023-01-21 DIAGNOSIS — S0510XS Contusion of eyeball and orbital tissues, unspecified eye, sequela: Secondary | ICD-10-CM | POA: Insufficient documentation

## 2023-01-21 DIAGNOSIS — G8929 Other chronic pain: Secondary | ICD-10-CM | POA: Insufficient documentation

## 2023-01-21 HISTORY — DX: Contusion of eyeball and orbital tissues, unspecified eye, sequela: S05.10XS
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
## 2023-03-28 ENCOUNTER — Ambulatory Visit: Payer: Commercial Managed Care - PPO | Admitting: Internal Medicine

## 2023-03-28 ENCOUNTER — Encounter: Payer: Self-pay | Admitting: Internal Medicine

## 2023-03-28 VITALS — BP 124/80 | HR 89 | Temp 98.9°F | Ht 66.0 in | Wt 232.0 lb

## 2023-03-28 DIAGNOSIS — L02412 Cutaneous abscess of left axilla: Secondary | ICD-10-CM | POA: Diagnosis not present

## 2023-03-28 DIAGNOSIS — L03112 Cellulitis of left axilla: Secondary | ICD-10-CM | POA: Insufficient documentation

## 2023-03-28 MED ORDER — CEPHALEXIN 500 MG PO CAPS: 500.0000 mg | ORAL_CAPSULE | Freq: Three times a day (TID) | ORAL | 0 refills | Status: DC

## 2023-03-28 NOTE — Progress Notes (Signed)
Subjective:    Patient ID: Victoria Wagner, female    DOB: 1981/01/04, 42 y.o.   MRN: [id number]  HPI Here due to boil under left arm  Has had them before--though not in this area Has needed them to be lanced in past Started 3 days  Uses hot compresses but wouldn't open Fairly painful  Current Outpatient Medications on File Prior to Visit  Medication Sig Dispense Refill   acetaminophen (TYLENOL) 500 MG tablet Take 500 mg by mouth every 6 (six) hours as needed for mild pain.     Ascorbic Acid (VITAMIN C WITH ROSE HIPS) 1000 MG tablet Take 3,000 mg by mouth daily.     cyclobenzaprine (FLEXERIL) 10 MG tablet Take 1 tablet by mouth 3 (three) times daily.     ELDERBERRY PO Take by mouth.     indomethacin (INDOCIN SR) 75 MG CR capsule Take 75 mg by mouth 2 (two) times daily with a meal.     ketorolac (TORADOL) 10 MG tablet Take 1 tablet (10 mg total) by mouth every 6 (six) hours as needed (pain). 20 tablet 0   Multiple Vitamin (MULTIVITAMIN ADULT PO) Take by mouth. women's     NON FORMULARY Natural supplement for digestion support.     oxcarbazepine (TRILEPTAL) 600 MG tablet Take 300 mg by mouth every 6 (six) hours. For pain     OXcarbazepine ER (OXTELLAR XR) 300 MG TB24 1 tablet on an empty stomach Orally Twice a day     PEPPERMINT OIL PO Take by mouth daily.     Vitamin D-Vitamin K (VITAMIN K2-VITAMIN D3 PO) Take by mouth daily.     ZINC PICOLINATE PO Take by mouth daily.     No current facility-administered medications on file prior to visit.    No Known Allergies  Past Medical History:  Diagnosis Date   Anxiety    Brachial neuritis    Cataract    Post traumatic- per patient   Cervical radiculopathy    Cervical vertigo    Degeneration of cervical intervertebral disc    Depression    Dizziness    episodes of vertigo, 2-3x month triggered by pain in neck   Joint pain    Muscle spasm    neck/shoulder blades r/t cervical fusion   Myalgia    Myofascial pain    Myositis     Neuralgia    Neuritis    Occipital headache    Pain management    Dr. Kellie Shropshire at [facility]   Postpartum care following cesarean delivery (07/31/12) 08/01/2012   Rash and nonspecific skin eruption 01/31/2019   S/P primary low transverse C-section (07/31/12) 08/01/2012   Swallowing difficulty    improved as 10/18/21 per pt, can be hard to get pills/food down   Weakness of both hands    from cervical fusion   Weakness of both legs    from cervical fusion, patient states that she is able to walk, if she starts feeling weak she lays down, as of 10/18/21    Past Surgical History:  Procedure Laterality Date   CERVICAL FUSION  04/2010   C3-C6   CESAREAN SECTION N/A 07/31/2012   Procedure: CESAREAN SECTION;  Surgeon: Lenoard Aden, MD;  Location: [facility];  Service: Obstetrics;  Laterality: N/A;   EDD: 08/18/12   COLONOSCOPY WITH PROPOFOL N/A 12/13/2020   Procedure: COLONOSCOPY WITH PROPOFOL;  Surgeon: Toney Reil, MD;  Location: [facility] ENDOSCOPY;  Service: Gastroenterology;  Laterality: N/A;  DILITATION & CURRETTAGE/HYSTROSCOPY WITH NOVASURE ABLATION N/A 10/27/2021   Procedure: DILATATION & CURETTAGE/HYSTEROSCOPY WITH NOVASURE ABLATION;  Surgeon: Lavina Hamman, MD;  Location: [facility] [place];  Service: Gynecology;  Laterality: N/A;   ECTOPIC PREGNANCY SURGERY  04/2005   ESOPHAGOGASTRODUODENOSCOPY (EGD) WITH PROPOFOL N/A 12/13/2020   Procedure: ESOPHAGOGASTRODUODENOSCOPY (EGD) WITH PROPOFOL;  Surgeon: Toney Reil, MD;  Location: [facility] ENDOSCOPY;  Service: Gastroenterology;  Laterality: N/A;   LAPAROSCOPIC BILATERAL SALPINGECTOMY Bilateral 10/27/2021   Procedure: LAPAROSCOPIC BILATERAL SALPINGECTOMY;  Surgeon: Lavina Hamman, MD;  Location: [facility] [place];  Service: Gynecology;  Laterality: Bilateral;    History reviewed. No pertinent family history.  Social History   Socioeconomic History   Marital status: Married    Spouse name: Antyuan     Number of children: 1   Years of education: BA   Highest education level: Not on file  Occupational History   Occupation: Unemployed,disability  Tobacco Use   Smoking status: Never   Smokeless tobacco: Never  Vaping Use   Vaping status: Never Used  Substance and Sexual Activity   Alcohol use: No    Alcohol/week: 0.0 standard drinks of alcohol   Drug use: No   Sexual activity: Not on file    Comment: nuva ring  Other Topics Concern   Not on file  Social History Narrative   Married   Right handed.   One child, daughter.   Enjoys relaxing, spending time with her husband.   Social Determinants of Health   Financial Resource Strain: Not on file  Food Insecurity: Food Insecurity Present (02/06/2018)   Hunger Vital Sign    Worried About Running Out of Food in the Last Year: Often true    Ran Out of Food in the Last Year: Not on file  Transportation Needs: Not on file  Physical Activity: Not on file  Stress: Not on file  Social Connections: Unknown (01/17/2023)   Received from [facility]   Social Network    Social Network: Not on file  Intimate Partner Violence: Unknown (01/17/2023)   Received from [facility]   HITS    Physically Hurt: Not on file    Insult or Talk Down To: Not on file    Threaten Physical Harm: Not on file    Scream or Curse: Not on file   Review of Systems No fever     Objective:   Physical Exam Skin:    Comments: 6 x 3 cm abscess along anterior axillary line on left Marked redness and tenderness            Assessment & Plan:

## 2023-03-28 NOTE — Assessment & Plan Note (Signed)
Apparent cyst/gland with abscess Will incise Treat with cephalexin 500 tid for up to 7 days (may not need full course)

## 2023-03-28 NOTE — Assessment & Plan Note (Signed)
Discussed options and she gave verbal consent Ethyl chloride then 2cc 1% lido with epi Small incision with #11 blade and fluid came out under pressure  Then ~30cc pus expressed Fairly empty and painful to manipulate--so decided against packing  She will go home and continue warm compresses and pressing--to fully empty abscess Antibiotics given

## 2023-04-26 ENCOUNTER — Encounter: Payer: Commercial Managed Care - PPO | Admitting: Primary Care

## 2023-05-01 NOTE — Telephone Encounter (Signed)
 Patients husband CPE is at 8:00 am tomorrow  Patients CPE is at 11:00 tomorrow.

## 2023-05-01 NOTE — Telephone Encounter (Signed)
 Victoria Wagner

## 2023-05-02 ENCOUNTER — Ambulatory Visit: Payer: Commercial Managed Care - PPO | Admitting: Primary Care

## 2023-05-02 ENCOUNTER — Encounter: Payer: Self-pay | Admitting: Primary Care

## 2023-05-02 VITALS — BP 148/96 | HR 94 | Temp 98.1°F | Ht 66.0 in | Wt 234.0 lb

## 2023-05-02 DIAGNOSIS — G894 Chronic pain syndrome: Secondary | ICD-10-CM

## 2023-05-02 DIAGNOSIS — Z23 Encounter for immunization: Secondary | ICD-10-CM | POA: Diagnosis not present

## 2023-05-02 DIAGNOSIS — R140 Abdominal distension (gaseous): Secondary | ICD-10-CM | POA: Diagnosis not present

## 2023-05-02 DIAGNOSIS — L02412 Cutaneous abscess of left axilla: Secondary | ICD-10-CM | POA: Diagnosis not present

## 2023-05-02 DIAGNOSIS — E785 Hyperlipidemia, unspecified: Secondary | ICD-10-CM

## 2023-05-02 DIAGNOSIS — M542 Cervicalgia: Secondary | ICD-10-CM

## 2023-05-02 DIAGNOSIS — G8929 Other chronic pain: Secondary | ICD-10-CM

## 2023-05-02 DIAGNOSIS — E1165 Type 2 diabetes mellitus with hyperglycemia: Secondary | ICD-10-CM | POA: Diagnosis not present

## 2023-05-02 DIAGNOSIS — I10 Essential (primary) hypertension: Secondary | ICD-10-CM

## 2023-05-02 DIAGNOSIS — Z0000 Encounter for general adult medical examination without abnormal findings: Secondary | ICD-10-CM | POA: Diagnosis not present

## 2023-05-02 LAB — COMPREHENSIVE METABOLIC PANEL
ALT: 9 U/L (ref 0–35)
AST: 9 U/L (ref 0–37)
Albumin: 3.9 g/dL (ref 3.5–5.2)
Alkaline Phosphatase: 120 U/L — ABNORMAL HIGH (ref 39–117)
BUN: 18 mg/dL (ref 6–23)
CO2: 30 meq/L (ref 19–32)
Calcium: 8.7 mg/dL (ref 8.4–10.5)
Chloride: 102 meq/L (ref 96–112)
Creatinine, Ser: 0.92 mg/dL (ref 0.40–1.20)
GFR: 76.9 mL/min (ref >60.00)
Glucose, Bld: 98 mg/dL (ref 70–99)
Potassium: 3.5 meq/L (ref 3.5–5.1)
Sodium: 140 meq/L (ref 135–145)
Total Bilirubin: 0.3 mg/dL (ref 0.2–1.2)
Total Protein: 7 g/dL (ref 6.0–8.3)

## 2023-05-02 LAB — MICROALBUMIN / CREATININE URINE RATIO
Creatinine,U: 163.5 mg/dL
Microalb Creat Ratio: 3.4 mg/g (ref 0.0–30.0)
Microalb, Ur: 5.5 mg/dL — ABNORMAL HIGH (ref 0.0–1.9)

## 2023-05-02 LAB — CBC
HCT: 38.7 % (ref 36.0–46.0)
Hemoglobin: 12.2 g/dL (ref 12.0–15.0)
MCHC: 31.5 g/dL (ref 30.0–36.0)
MCV: 82.5 fL (ref 78.0–100.0)
Platelets: 314 10*3/uL (ref 150.0–400.0)
RBC: 4.69 Mil/uL (ref 3.87–5.11)
RDW: 16.1 % — ABNORMAL HIGH (ref 11.5–15.5)
WBC: 9.7 10*3/uL (ref 4.0–10.5)

## 2023-05-02 LAB — LIPID PANEL
Cholesterol: 219 mg/dL — ABNORMAL HIGH (ref 0–200)
HDL: 47.1 mg/dL (ref >39.00)
LDL Cholesterol: 154 mg/dL — ABNORMAL HIGH (ref 0–99)
NonHDL: 171.4
Total CHOL/HDL Ratio: 5
Triglycerides: 86 mg/dL (ref 0.0–149.0)
VLDL: 17.2 mg/dL (ref 0.0–40.0)

## 2023-05-02 LAB — HEMOGLOBIN A1C: Hgb A1c MFr Bld: 6.5 % (ref 4.6–6.5)

## 2023-05-02 MED ORDER — OMEPRAZOLE 40 MG PO CPDR
40.0000 mg | DELAYED_RELEASE_CAPSULE | Freq: Every day | ORAL | 3 refills | Status: AC
Start: 2023-05-02 — End: ?

## 2023-05-02 NOTE — Assessment & Plan Note (Addendum)
 Recurrent flares.  Following with pain management and PT. Continue Oxtellar XR 300 mg 1-3 times daily, cyclobenzaprine 10 mg 3 times daily, indomethacin 75 mg twice daily, Toradol 10 mg every 6 hours as needed.

## 2023-05-02 NOTE — Assessment & Plan Note (Signed)
 Immunizations UTD. Influenza vaccine provided today.  Pap smear UTD.  Follows with GYN. Mammogram due, she will schedule with her GYN.  Discussed the importance of a healthy diet and regular exercise in order for weight loss, and to reduce the risk of further co-morbidity.  Exam stable. Labs pending.  Follow up in 1 year for repeat physical.

## 2023-05-02 NOTE — Assessment & Plan Note (Signed)
 Above goal today, improved upon recheck. Last several office visit blood pressure readings were within normal range.  She is in increased pain today.  Recommended she monitor her blood pressures at home. Continue to monitor.

## 2023-05-02 NOTE — Assessment & Plan Note (Signed)
 No follow-up since November 2023. Long discussion today regarding the importance of regular diabetes follow-up, even if not taking medications for treatment.  Repeat A1c due and pending. Add urine microalbumin.  Await results.

## 2023-05-02 NOTE — Patient Instructions (Signed)
 Stop by the lab prior to leaving today. I will notify you of your results once received.   Be sure to schedule your mammogram.  It was a pleasure to see you today!
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
## 2023-05-02 NOTE — Assessment & Plan Note (Addendum)
 Chronic and continued.   Improved with omeprazole 40 mg daily. Refill provided.

## 2023-05-02 NOTE — Assessment & Plan Note (Signed)
 Recurrent flares.  Following with pain management and PT. Continue Oxtellar XR 300 mg 1-3 times daily, cyclobenzaprine 10 mg 3 times daily, indomethacin 75 mg twice daily, Toradol 10 mg every 6 hours as needed.

## 2023-05-02 NOTE — Assessment & Plan Note (Signed)
Improved and nearly resolved.  

## 2023-05-08 MED ORDER — TIRZEPATIDE 2.5 MG/0.5ML ~~LOC~~ SOAJ
2.5000 mg | SUBCUTANEOUS | 0 refills | Status: DC
Start: 2023-05-08 — End: 2023-06-06

## 2023-06-04 NOTE — Telephone Encounter (Signed)
 Kelli, will you call her pharmacy to find out what is going on with the Mounjaro  prescription?  If it is stuck in prior authorization, will you contact the PA team?  It has been nearly 1 month.

## 2023-06-05 ENCOUNTER — Telehealth: Payer: Self-pay

## 2023-06-05 ENCOUNTER — Other Ambulatory Visit (HOSPITAL_COMMUNITY): Payer: Self-pay

## 2023-06-05 NOTE — Telephone Encounter (Signed)
Please submit PA for [facility] 2.5mg  dose.

## 2023-06-05 NOTE — Telephone Encounter (Signed)
Pharmacy Patient Advocate Encounter  Received notification from [facility] that Prior Authorization for Mounjaro 2.5MG /0.5ML auto-injectors has been APPROVED from 06/05/2023 to 06/04/2024. Ran test claim, Copay is $25.00. This test claim was processed through [facility]- copay amounts may vary at other pharmacies due to pharmacy/plan contracts, or as the patient moves through the different stages of their insurance plan.   PA #/Case ID/Reference #: [id number]

## 2023-06-05 NOTE — Telephone Encounter (Signed)
Contacted patients pharmacy, they confirmed we are waiting on Mounjaro 2.5mg  PA.  Sending message to PA team.

## 2023-06-05 NOTE — Telephone Encounter (Signed)
Pharmacy Patient Advocate Encounter   Received notification from Patient Advice Request messages that prior authorization for Mounjaro 2.5MG /0.5ML auto-injectors is required/requested.   Insurance verification completed.   The patient is insured through [facility] .   Per test claim: PA required; PA submitted to above mentioned insurance via CoverMyMeds Key/confirmation #/EOC [id number] Status is pending

## 2023-06-06 MED ORDER — TIRZEPATIDE 2.5 MG/0.5ML ~~LOC~~ SOAJ
2.5000 mg | SUBCUTANEOUS | 0 refills | Status: DC
  Filled 2023-06-06: qty 2, 28d supply, fill #0

## 2023-06-06 NOTE — Telephone Encounter (Signed)
Patient notified through MyChart message

## 2023-06-07 ENCOUNTER — Other Ambulatory Visit: Payer: Self-pay

## 2023-06-07 ENCOUNTER — Other Ambulatory Visit (HOSPITAL_COMMUNITY): Payer: Self-pay

## 2023-06-28 DIAGNOSIS — E1165 Type 2 diabetes mellitus with hyperglycemia: Secondary | ICD-10-CM

## 2023-06-30 MED ORDER — TIRZEPATIDE 2.5 MG/0.5ML ~~LOC~~ SOAJ
2.5000 mg | SUBCUTANEOUS | 0 refills | Status: DC
Start: 2023-06-30 — End: 2023-07-01

## 2023-07-01 ENCOUNTER — Other Ambulatory Visit: Payer: Self-pay

## 2023-07-01 MED ORDER — TIRZEPATIDE 5 MG/0.5ML ~~LOC~~ SOAJ
5.0000 mg | SUBCUTANEOUS | 0 refills | Status: DC
Start: 2023-07-01 — End: 2023-08-03
  Filled 2023-07-01: qty 2, 28d supply, fill #0

## 2023-08-01 DIAGNOSIS — E1165 Type 2 diabetes mellitus with hyperglycemia: Secondary | ICD-10-CM

## 2023-08-02 IMAGING — CR DG PELVIS 3+V JUDET
3 series · 3 of 3 positions shown · non-contrast
Comparison: None.

CLINICAL DATA: Pain at the pubic symphysis

EXAM:
JUDET PELVIS - 3+ VIEW

[w pelvis upright (1 of 3)]
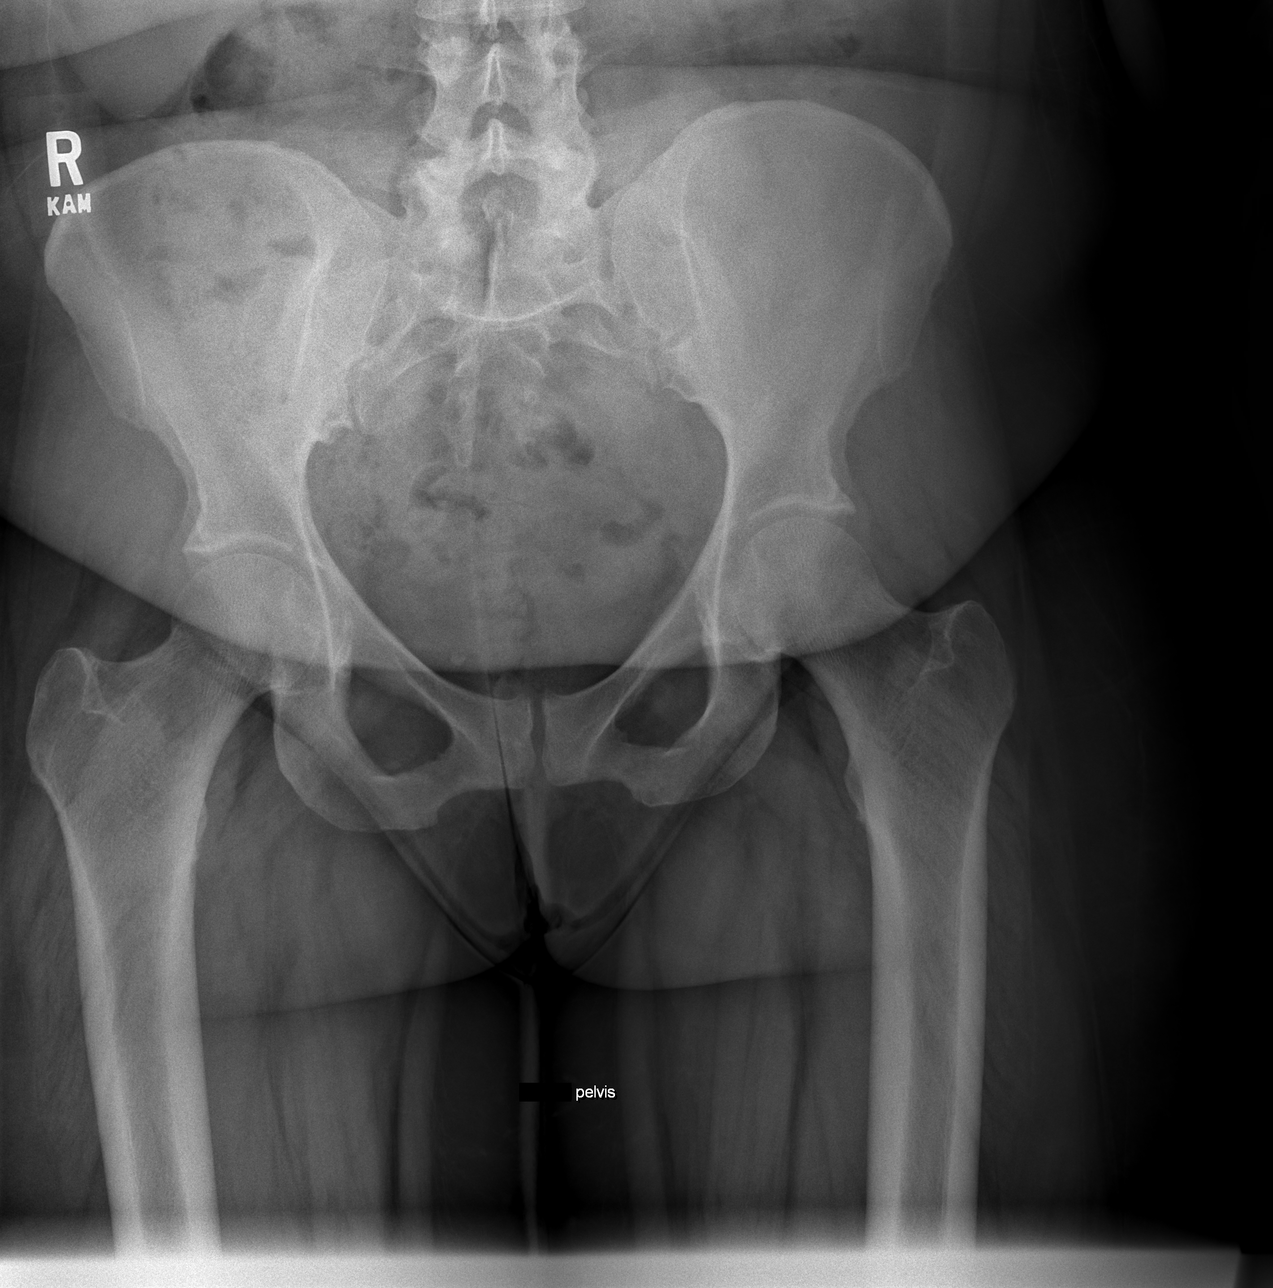

[w pelvis upright (2 of 3)]
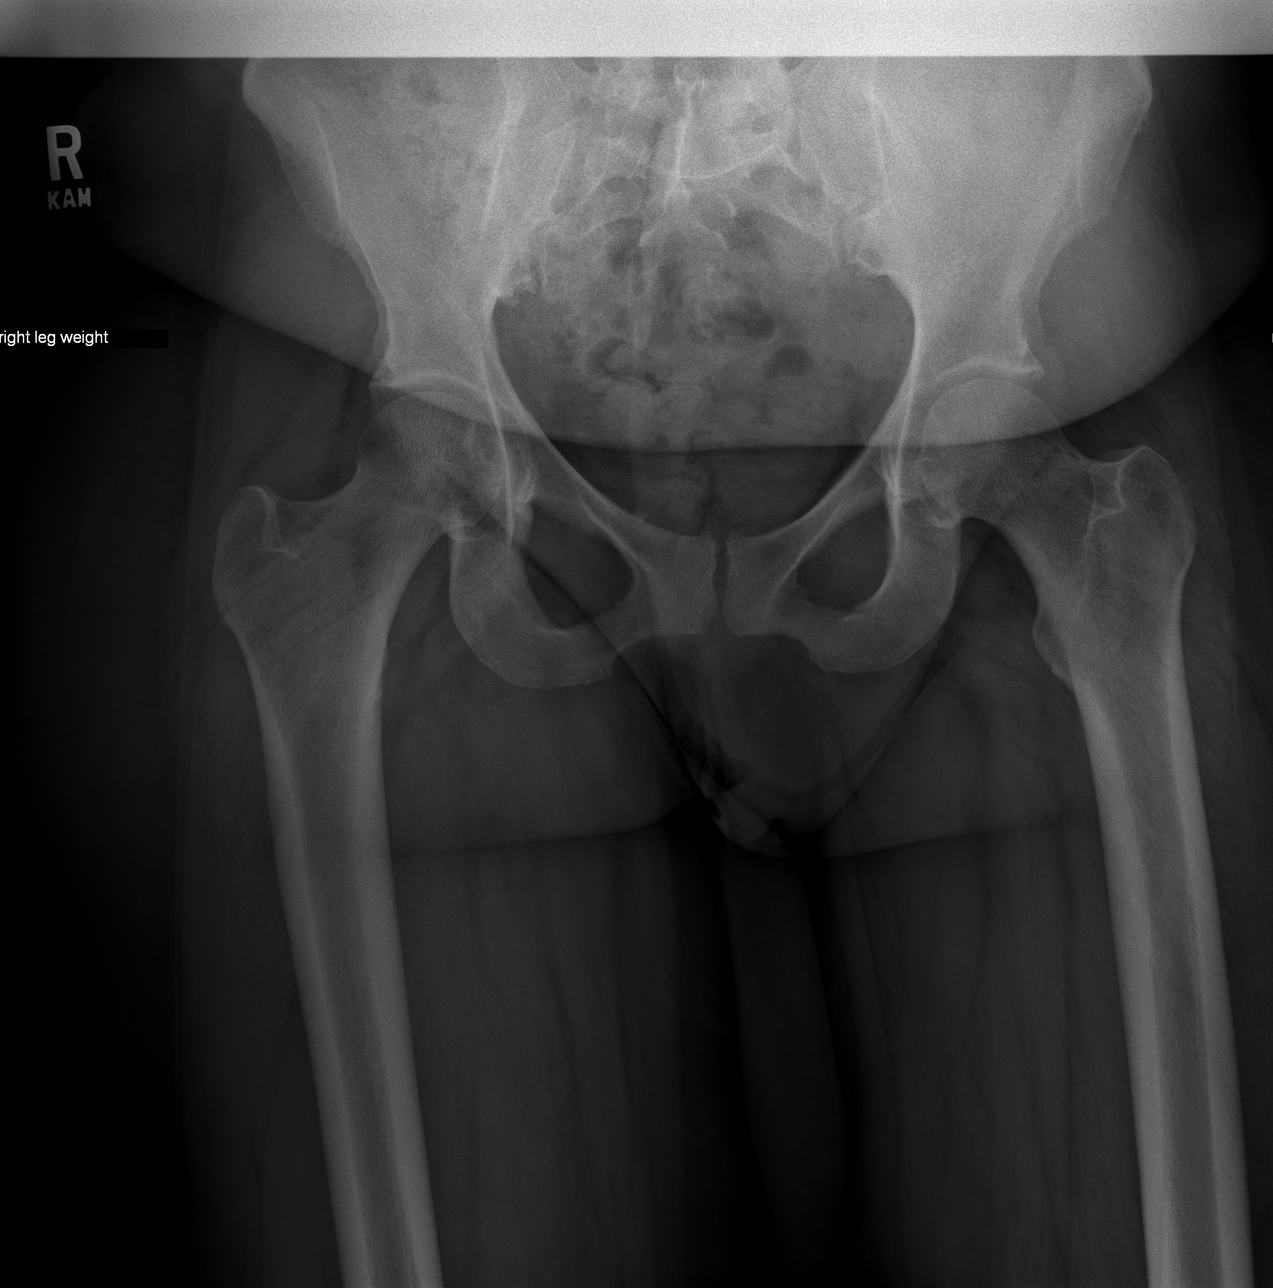

[w pelvis upright (3 of 3)]
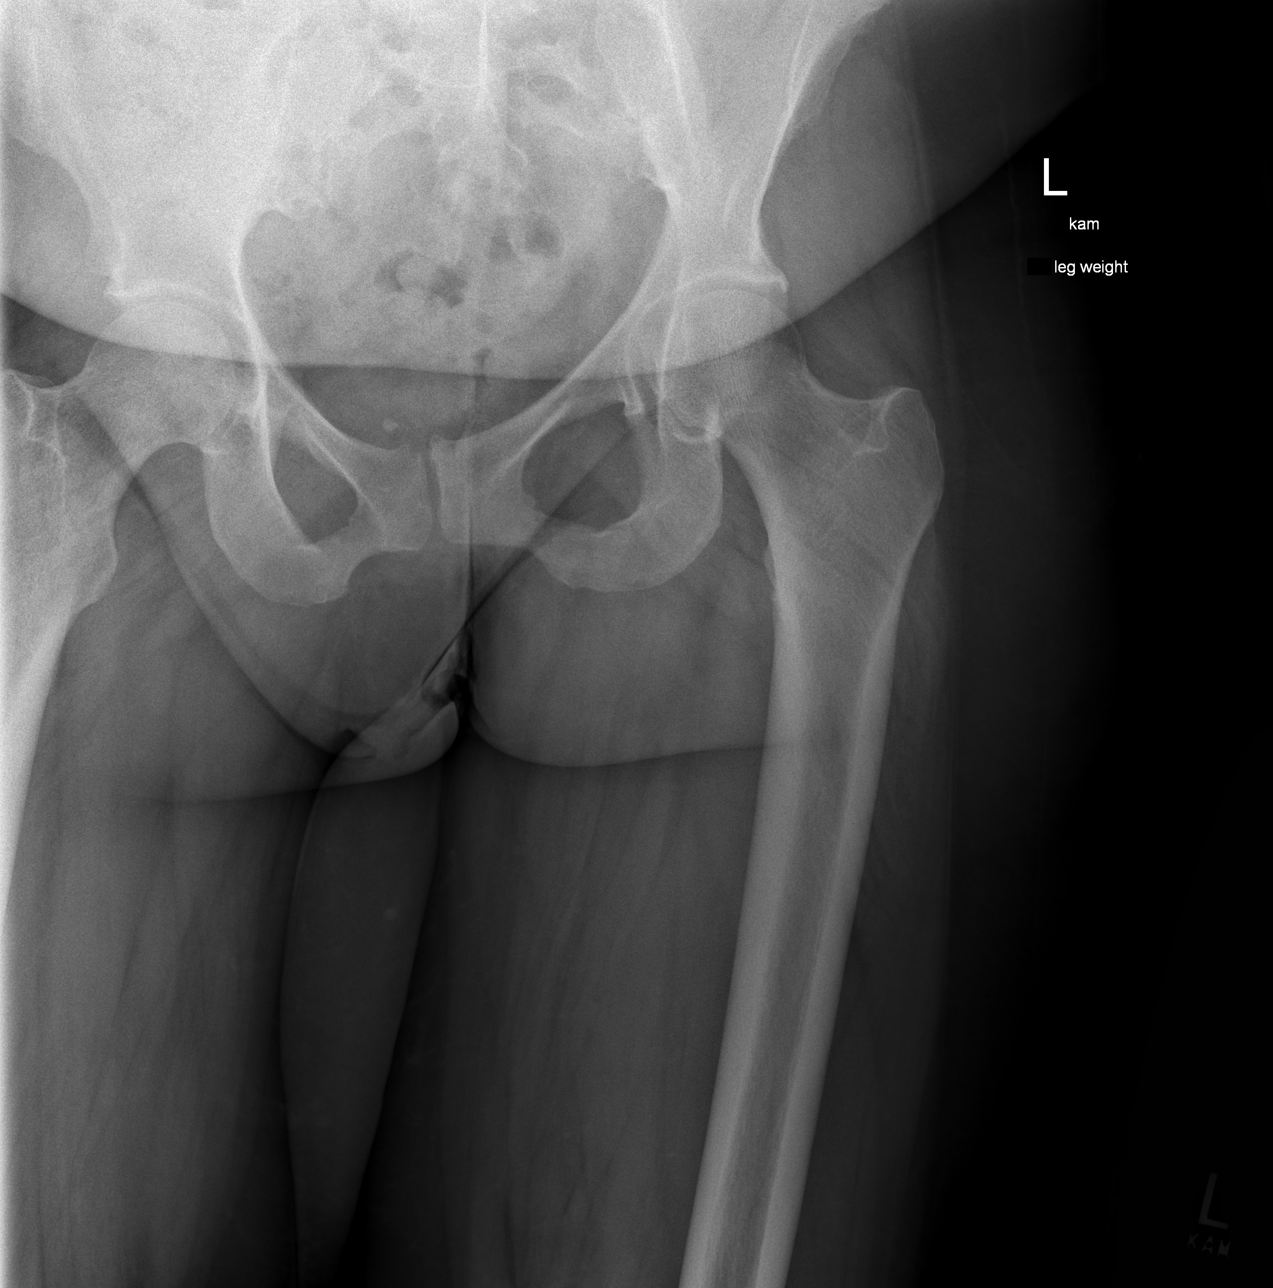

[3 of 3 positions shown; findings below may reference images not displayed]

FINDINGS: Frontal and bilateral Judet views of the pelvis are obtained. No
fractures. Joint spaces are well preserved. Mild hypertrophic
changes of the pubic symphysis. No bony erosions or other
destructive process. Sacroiliac joints are normal.
IMPRESSION: 1. Mild degenerative changes of the pubic symphysis. No acute bony
abnormality.

## 2023-08-03 ENCOUNTER — Other Ambulatory Visit: Payer: Self-pay

## 2023-08-03 MED ORDER — OZEMPIC (0.25 OR 0.5 MG/DOSE) 2 MG/3ML ~~LOC~~ SOPN
PEN_INJECTOR | SUBCUTANEOUS | 0 refills | Status: DC
Start: 2023-08-03 — End: 2023-08-17
  Filled 2023-08-03: qty 3, 28d supply, fill #0
  Filled 2023-08-08: qty 3, 42d supply, fill #0

## 2023-08-06 ENCOUNTER — Telehealth: Payer: Self-pay

## 2023-08-06 ENCOUNTER — Other Ambulatory Visit (HOSPITAL_COMMUNITY): Payer: Self-pay

## 2023-08-06 NOTE — Telephone Encounter (Signed)
 Pharmacy Patient Advocate Encounter   Received notification from Onbase that prior authorization for Ozempic (0.25 or 0.5 MG/DOSE) 2MG /3ML pen-injectors is required/requested.   Insurance verification completed.   The patient is insured through [facility] .   Per test claim: PA required; PA submitted to above mentioned insurance via CoverMyMeds Key/confirmation #/EOC  [facility] Status is pending

## 2023-08-08 ENCOUNTER — Other Ambulatory Visit: Payer: Self-pay

## 2023-08-09 ENCOUNTER — Other Ambulatory Visit (HOSPITAL_COMMUNITY): Payer: Self-pay

## 2023-08-09 ENCOUNTER — Other Ambulatory Visit: Payer: Self-pay

## 2023-08-09 NOTE — Telephone Encounter (Signed)
 Pharmacy Patient Advocate Encounter  Received notification from [facility] that Prior Authorization for Ozempic (0.25 or 0.5 MG/DOSE) 2MG /3ML pen-injectors has been APPROVED from 08/08/23 to 08/07/24. Ran test claim, Copay is $25. This test claim was processed through [facility]- copay amounts may vary at other pharmacies due to pharmacy/plan contracts, or as the patient moves through the different stages of their insurance plan.   PA #/Case ID/Reference #: [id number]

## 2023-08-16 DIAGNOSIS — E1165 Type 2 diabetes mellitus with hyperglycemia: Secondary | ICD-10-CM

## 2023-08-17 MED ORDER — OZEMPIC (0.25 OR 0.5 MG/DOSE) 2 MG/3ML ~~LOC~~ SOPN
0.5000 mg | PEN_INJECTOR | SUBCUTANEOUS | 0 refills | Status: DC
Start: 2023-08-17 — End: 2023-11-27

## 2023-08-28 DIAGNOSIS — G44329 Chronic post-traumatic headache, not intractable: Secondary | ICD-10-CM

## 2023-09-24 NOTE — Telephone Encounter (Signed)
 Victoria Wagner, I'm not sure if I sent this earlier, but any chance we can get her in sooner with neurology? She's had ongoing headaches since a head injury in September

## 2023-11-27 DIAGNOSIS — E1165 Type 2 diabetes mellitus with hyperglycemia: Secondary | ICD-10-CM

## 2023-11-27 MED ORDER — SEMAGLUTIDE (1 MG/DOSE) 4 MG/3ML ~~LOC~~ SOPN: 1.0000 mg | PEN_INJECTOR | SUBCUTANEOUS | 0 refills | Status: DC

## 2023-11-27 MED ORDER — SEMAGLUTIDE (1 MG/DOSE) 4 MG/3ML ~~LOC~~ SOPN
1.0000 mg | PEN_INJECTOR | SUBCUTANEOUS | 0 refills | Status: DC
Start: 2023-11-27 — End: 2023-11-27

## 2023-12-14 ENCOUNTER — Encounter: Payer: Self-pay | Admitting: Neurology

## 2023-12-14 ENCOUNTER — Ambulatory Visit: Admitting: Neurology

## 2023-12-14 VITALS — BP 160/108 | HR 90 | Ht 65.0 in | Wt 223.2 lb

## 2023-12-14 DIAGNOSIS — G43709 Chronic migraine without aura, not intractable, without status migrainosus: Secondary | ICD-10-CM

## 2023-12-14 MED ORDER — NORTRIPTYLINE HCL 10 MG PO CAPS
ORAL_CAPSULE | ORAL | 11 refills | Status: DC
Start: 2023-12-14 — End: 2024-01-22
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
## 2023-12-14 NOTE — Patient Instructions (Addendum)
 Start Nortrityline at bedtime  Get botox approved  May instead or in addition consider botox for cervical dystonia which would allow us  to inject higher doses of botulium toxin in the neck area.   Propranol/atenolol is a good medication for both migraines and post-traumatic headaches which is likely contributing to presentation and also given elevated blood pressure would be a good medication to start. However unclear if BP readings accurate, today in the office is elevated and has been elevated consistently and seeing pcp soon and discuss.  Would take BP reading 3x a day at home prior to seeing pcp. Other blood pressure medications may also be similarly effective see primary care.   Many people who have cervical dystonia also experience neck pain that can radiate into the shoulders. The disorder can also cause headaches. In some people, the pain from cervical dystonia can be exhausting and disabling  Nortriptyline  Capsules What is this medication? NORTRIPTYLINE  (nor TRIP ti leen) treats depression. It increases the amount of serotonin and norepinephrine in the brain, substances that help regulate mood. It belongs to a group of medications called tricyclic antidepressants (TCAs). This medicine may be used for other purposes; ask your health care provider or pharmacist if you have questions. COMMON BRAND NAME(S): Aventyl , Pamelor  What should I tell my care team before I take this medication? They need to know if you have any of these conditions: Bipolar disorder Brugada syndrome Frequently drink alcohol Glaucoma Heart or blood vessel conditions History of heart attack or stroke Liver disease Schizophrenia Seizures Suicidal thoughts, plans, or attempt by you or a family member Thyroid  disease Trouble passing urine An unusual or allergic reaction to nortriptyline , other medications, foods, dyes, or preservatives Pregnant or trying to get pregnant Breastfeeding How should I use this  medication? Take this medication by mouth with a glass of water. Follow the directions on the prescription label. Take your doses at regular intervals. Do not take it more often than directed. Do not stop taking this medication suddenly except upon the advice of your care team. Stopping this medication too quickly may cause serious side effects or your condition may worsen. A special MedGuide will be given to you by the pharmacist with each prescription and refill. Be sure to read this information carefully each time. Talk to your care team about the use of this medication in children. Special care may be needed. Overdosage: If you think you have taken too much of this medicine contact a poison control center or emergency room at once. NOTE: This medicine is only for you. Do not share this medicine with others. What if I miss a dose? If you miss a dose, take it as soon as you can. If it is almost time for your next dose, take only that dose. Do not take double or extra doses. What may interact with this medication? Do not take this medication with any of the following: Cisapride Dronedarone Linezolid MAOIs, such as Marplan, Nardil, and Parnate Methylene blue (injected into a vein) Pimozide Thioridazine This medication may also interact with the following: Alcohol Antihistamines for allergy, cough, and cold Atropine Buspirone Certain medications for bladder problems, such as oxybutynin or tolterodine Certain medications for depression, such as amitriptyline, fluoxetine, sertraline Certain medications for headaches, such as sumatriptan or rizatriptan Certain medications for Parkinson disease, such as benztropine or trihexyphenidyl Certain medications for stomach problems, such as dicyclomine or hyoscyamine Certain medications for travel sickness, such as scopolamine  Chlorpropamide Cimetidine Fentanyl  Ipratropium Lithium Other medications that cause  heart rhythm changes, such as  dofetilide Quinidine Reserpine St. John's wort Thyroid  medication Tramadol  Tryptophan This list may not describe all possible interactions. Give your health care provider a list of all the medicines, herbs, non-prescription drugs, or dietary supplements you use. Also tell them if you smoke, drink alcohol, or use illegal drugs. Some items may interact with your medicine. What should I watch for while using this medication? Visit your care team for regular checks on your progress. It may be some time before you see the benefit from this medication. This medication may cause thoughts of suicide or depression. This includes sudden changes in mood, behaviors, or thoughts. These changes can happen at any time but are more common in the beginning of treatment or after a change in dose. Call your care team right away if you experience these thoughts or worsening depression. This medication may affect your coordination, reaction time, or judgment. Do not drive or operate machinery until you know how this medication affects you. Sit up or stand slowly to reduce the risk of dizzy or fainting spells. Drinking alcohol with this medication can increase the risk of these side effects. Your mouth may get dry. Chewing sugarless gum or sucking hard candy and drinking plenty of water may help. Contact your care team if the problem does not go away or is severe. This medication may cause dry eyes and blurred vision. If you wear contact lenses, you may feel some discomfort. Lubricating eye drops may help. See your care team if the problem does not go away or is severe. This medication will cause constipation. If you do not have a bowel movement for 3 days, call your care team. This medication can make you more sensitive to the sun. Keep out of the sun. If you cannot avoid being in the sun, wear protective clothing and sunscreen. Do not use sun lamps, tanning beds, or tanning booths. What side effects may I notice from  receiving this medication? Side effects that you should report to your care team as soon as possible: Allergic reactions--skin rash, itching, hives, swelling of the face, lips, tongue, or throat Heart rhythm changes--fast or irregular heartbeat, dizziness, feeling faint or lightheaded, chest pain, trouble breathing Irritability, confusion, fast or irregular heartbeat, muscle stiffness, twitching muscles, sweating, high fever, seizure, chills, vomiting, diarrhea, which may be signs of serotonin syndrome Seizures Sudden eye pain or change in vision such as blurry vision, seeing halos around lights, vision loss Thoughts of suicide or self-harm, worsening mood, feelings of depression Trouble passing urine Side effects that usually do not require medical attention (report to your care team if they continue or are bothersome): Change in sex drive or performance Constipation Dizziness Drowsiness Dry mouth Tremors or shaking This list may not describe all possible side effects. Call your doctor for medical advice about side effects. You may report side effects to FDA at 1-800-FDA-1088. Where should I keep my medication? Keep out of the reach of children. Store at room temperature between 15 and 30 degrees C (59 and 86 degrees F). Keep container tightly closed. Throw away any unused medication after the expiration date. NOTE: This sheet is a summary. It may not cover all possible information. If you have questions about this medicine, talk to your doctor, pharmacist, or health care provider.  2024 Elsevier/Gold Standard (2022-07-19 00:00:00)

## 2023-12-16 DIAGNOSIS — G43709 Chronic migraine without aura, not intractable, without status migrainosus: Secondary | ICD-10-CM | POA: Insufficient documentation

## 2023-12-17 ENCOUNTER — Telehealth: Payer: Self-pay | Admitting: Neurology

## 2023-12-17 ENCOUNTER — Telehealth: Payer: Self-pay | Admitting: *Deleted

## 2023-12-17 DIAGNOSIS — G43709 Chronic migraine without aura, not intractable, without status migrainosus: Secondary | ICD-10-CM

## 2023-12-17 NOTE — Telephone Encounter (Signed)
-----   Message from Onetha KATHEE Epp sent at 12/16/2023 12:00 PM EDT ----- Regarding: botox for migriane New start [phone]; Dr. Epp will do first several botox injections I can add her on to my schedule when she gets approved and see if patient finds relief (and transition to NP) or if she wants to also include for cervical dystonia and then can discuss with dr onita

## 2023-12-17 NOTE — Telephone Encounter (Signed)
Chronic Migraine CPT 64615    Botox [id number] Units:155  G43.709 Chronic Migraine without aura, not intractable, without status migrainous

## 2023-12-17 NOTE — Telephone Encounter (Signed)
 Duplicate encounter

## 2023-12-19 NOTE — Telephone Encounter (Signed)
 Submitted auth request to pt's PBM, [employer], via CMM. Status is pending with Key: [id number]. [facility] does not require PA for [id number].

## 2023-12-19 NOTE — Telephone Encounter (Signed)
 Called and spoke with patient, she will work on uploading a picture of insurance cards into [employer].

## 2023-12-25 ENCOUNTER — Ambulatory Visit: Admitting: Primary Care

## 2023-12-25 MED ORDER — ONABOTULINUMTOXINA 200 UNITS IJ SOLR
INTRAMUSCULAR | 3 refills | Status: AC
Start: 2023-12-25 — End: ?

## 2023-12-25 NOTE — Telephone Encounter (Signed)
 Received approval, per insurance the rx needs to be sent to [facility].  Address: [address] [place], [place] [address] Phone#: [phone] Fax#: [phone]  Auth#: [id number] (12/20/23-06/21/24)

## 2023-12-25 NOTE — Addendum Note (Signed)
 Addended by: HILLIARD HEATHER CROME on: 12/25/2023 09:02 AM   Modules accepted: Orders

## 2023-12-25 NOTE — Telephone Encounter (Signed)
 Botox 200 unit Rx e-scribed to [facility] pharmacy in system.

## 2023-12-25 NOTE — Telephone Encounter (Signed)
 [facility] requested PA letter and notes to be faxed to them, I sent requested information to [phone].

## 2024-01-02 NOTE — Telephone Encounter (Signed)
 Called [facility] to check status, rep states they have a call scheduled for tomorrow 9/11 to obtain pt's consent. After they have this, they will call us  to set up shipment.

## 2024-01-03 ENCOUNTER — Encounter: Payer: Self-pay | Admitting: Primary Care

## 2024-01-03 ENCOUNTER — Ambulatory Visit: Admitting: Primary Care

## 2024-01-03 ENCOUNTER — Ambulatory Visit: Payer: Self-pay | Admitting: Primary Care

## 2024-01-03 VITALS — BP 152/100 | HR 76 | Temp 98.2°F | Ht 65.0 in | Wt 219.0 lb

## 2024-01-03 DIAGNOSIS — I10 Essential (primary) hypertension: Secondary | ICD-10-CM

## 2024-01-03 DIAGNOSIS — E1165 Type 2 diabetes mellitus with hyperglycemia: Secondary | ICD-10-CM | POA: Diagnosis not present

## 2024-01-03 DIAGNOSIS — Z23 Encounter for immunization: Secondary | ICD-10-CM | POA: Diagnosis not present

## 2024-01-03 DIAGNOSIS — Z794 Long term (current) use of insulin: Secondary | ICD-10-CM | POA: Diagnosis not present

## 2024-01-03 LAB — POCT GLYCOSYLATED HEMOGLOBIN (HGB A1C): Hemoglobin A1C: 5.6 % (ref 4.0–5.6)

## 2024-01-03 MED ORDER — BLOOD GLUCOSE MONITORING SUPPL DEVI
1.0000 | Freq: Three times a day (TID) | 0 refills | Status: DC
Start: 2024-01-03 — End: 2024-02-01

## 2024-01-03 MED ORDER — HYDROCHLOROTHIAZIDE 12.5 MG PO TABS
12.5000 mg | ORAL_TABLET | Freq: Every day | ORAL | 0 refills | Status: DC
Start: 2024-01-03 — End: 2024-02-01

## 2024-01-03 MED ORDER — BLOOD GLUCOSE TEST VI STRP
ORAL_STRIP | 5 refills | Status: DC
Start: 2024-01-03 — End: 2024-02-01

## 2024-01-03 MED ORDER — LANCET DEVICE MISC: 1.0000 | Freq: Three times a day (TID) | 0 refills | Status: DC

## 2024-01-03 MED ORDER — LANCETS MISC. MISC: 1.0000 | Freq: Three times a day (TID) | 5 refills | Status: DC

## 2024-01-03 NOTE — Assessment & Plan Note (Signed)
 Remains uncontrolled.  She is agreeable to treatment. Start hydrochlorothiazide  12.5 mg once daily for blood pressure.  We will plan to see her back in the office in 2 weeks for blood pressure check and BMP

## 2024-01-03 NOTE — Patient Instructions (Signed)
 Start checking your blood sugar levels.  Appropriate times to check your blood sugar levels are:  -Before any meal (breakfast, lunch, dinner) -Two hours after any meal (breakfast, lunch, dinner) -Bedtime  Record your readings and notify me if you continue to consistently run at or above 150.   Start hydrochlorothiazide  12.5 mg tablets once daily for blood pressure.  Please schedule a physical to meet with me in 6 months.   It was a pleasure to see you today!
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
## 2024-01-03 NOTE — Assessment & Plan Note (Signed)
 Improved and controlled with A1c 5.6 today!  Continue Ozempic  1 mg weekly. Foot exam today.  Patient unable to provide urine sample for urine microalbumin.  Will get at next visit.  Follow-up in 6 months.

## 2024-01-07 NOTE — Telephone Encounter (Signed)
 Pt reports she has been called by the [facility]) she was told to call and schedule delivery of her Botox, please call pt.

## 2024-01-07 NOTE — Telephone Encounter (Signed)
 Scheduled delivery for this Wednesday, 9/17.

## 2024-01-07 NOTE — Telephone Encounter (Signed)
 Victoria Wagner is processing pt's copay card and will call me back to schedule delivery.

## 2024-01-09 NOTE — Telephone Encounter (Signed)
 Received (1) 200 unit vial of Botox from [facility].

## 2024-01-19 ENCOUNTER — Other Ambulatory Visit: Payer: Self-pay | Admitting: Neurology

## 2024-01-19 DIAGNOSIS — G43709 Chronic migraine without aura, not intractable, without status migrainosus: Secondary | ICD-10-CM

## 2024-01-30 ENCOUNTER — Ambulatory Visit: Admitting: Neurology

## 2024-01-30 ENCOUNTER — Encounter: Payer: Self-pay | Admitting: Neurology

## 2024-01-30 VITALS — BP 138/95 | HR 85

## 2024-01-30 DIAGNOSIS — G43709 Chronic migraine without aura, not intractable, without status migrainosus: Secondary | ICD-10-CM

## 2024-01-30 MED ORDER — ONABOTULINUMTOXINA 200 UNITS IJ SOLR
155.0000 [IU] | Freq: Once | INTRAMUSCULAR | Status: AC
Start: 2024-01-30 — End: 2024-01-30
  Administered 2024-01-30: 155 [IU] via INTRAMUSCULAR

## 2024-01-30 NOTE — Progress Notes (Signed)
 Botox- 200 units x 1 vial Lot: [id number] Expiration: 2028/03 NDC: 0023-3921-02  Bacteriostatic 0.9% Sodium Chloride - 4 mL  Lot: [id number] Expiration: 02/21/25 NDC: 9590803397  Dx:  H56.290     S/P  Witnessed by DELENA MOLT RN
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
## 2024-01-31 ENCOUNTER — Other Ambulatory Visit: Payer: Self-pay | Admitting: Primary Care

## 2024-01-31 DIAGNOSIS — I10 Essential (primary) hypertension: Secondary | ICD-10-CM

## 2024-01-31 NOTE — Telephone Encounter (Signed)
 Spoke with patient, scheduled for 10/10 @ 9:20am. She has 7-8 pills on hand.

## 2024-01-31 NOTE — Telephone Encounter (Signed)
 Needs a blood pressure follow up. Can we get her scheduled? Also, how many of her hydrochlorothiazide  pills does she have on hand?

## 2024-02-01 ENCOUNTER — Ambulatory Visit: Admitting: Primary Care

## 2024-02-01 ENCOUNTER — Encounter: Payer: Self-pay | Admitting: Primary Care

## 2024-02-01 ENCOUNTER — Ambulatory Visit: Payer: Self-pay | Admitting: Primary Care

## 2024-02-01 VITALS — BP 118/74 | HR 90 | Temp 98.6°F | Ht 65.0 in | Wt 217.0 lb

## 2024-02-01 DIAGNOSIS — E1165 Type 2 diabetes mellitus with hyperglycemia: Secondary | ICD-10-CM | POA: Diagnosis not present

## 2024-02-01 DIAGNOSIS — I10 Essential (primary) hypertension: Secondary | ICD-10-CM

## 2024-02-01 DIAGNOSIS — Z7985 Long-term (current) use of injectable non-insulin antidiabetic drugs: Secondary | ICD-10-CM | POA: Diagnosis not present

## 2024-02-01 DIAGNOSIS — E876 Hypokalemia: Secondary | ICD-10-CM

## 2024-02-01 LAB — BASIC METABOLIC PANEL WITH GFR
BUN: 15 mg/dL (ref 6–23)
CO2: 30 meq/L (ref 19–32)
Calcium: 8.8 mg/dL (ref 8.4–10.5)
Chloride: 101 meq/L (ref 96–112)
Creatinine, Ser: 0.99 mg/dL (ref 0.40–1.20)
GFR: 70.05 mL/min (ref >60.00)
Glucose, Bld: 84 mg/dL (ref 70–99)
Potassium: 3.2 meq/L — ABNORMAL LOW (ref 3.5–5.1)
Sodium: 139 meq/L (ref 135–145)

## 2024-02-01 MED ORDER — POTASSIUM CHLORIDE CRYS ER 20 MEQ PO TBCR
20.0000 meq | EXTENDED_RELEASE_TABLET | Freq: Two times a day (BID) | ORAL | 0 refills | Status: AC
Start: 2024-02-01 — End: ?

## 2024-02-01 MED ORDER — SEMAGLUTIDE (2 MG/DOSE) 8 MG/3ML ~~LOC~~ SOPN: 2.0000 mg | PEN_INJECTOR | SUBCUTANEOUS | 1 refills | Status: DC
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
## 2024-02-01 NOTE — Patient Instructions (Signed)
 Stop by the lab prior to leaving today. I will notify you of your results once received.   We increased the dose of your Ozempic  to 2 mg weekly.  Schedule a physical for January 2026.  It was a pleasure to see you today!

## 2024-02-01 NOTE — Addendum Note (Signed)
 Addended by: Jayah Balthazar K on: 02/01/2024 10:09 AM   Modules accepted: Orders

## 2024-02-01 NOTE — Assessment & Plan Note (Signed)
 Controlled.  Continue hydrochlorothiazide  12.5 mg daily. BMP pending.

## 2024-02-01 NOTE — Assessment & Plan Note (Signed)
 Agreed to increase Ozempic  for weight loss purposes.  Increase Ozempic  to 2 mg weekly.  We did discuss that this was the final dose daily to work on healthy lifestyle practices.  Follow-up in early 2026.

## 2024-03-24 DIAGNOSIS — E1165 Type 2 diabetes mellitus with hyperglycemia: Secondary | ICD-10-CM

## 2024-03-25 ENCOUNTER — Other Ambulatory Visit: Payer: Self-pay

## 2024-03-25 MED ORDER — SEMAGLUTIDE (2 MG/DOSE) 8 MG/3ML ~~LOC~~ SOPN
2.0000 mg | PEN_INJECTOR | SUBCUTANEOUS | 0 refills | Status: AC
  Filled 2024-03-25: qty 9, 90d supply, fill #0

## 2024-04-30 ENCOUNTER — Ambulatory Visit: Admitting: Neurology

## 2024-04-30 VITALS — BP 134/88

## 2024-04-30 DIAGNOSIS — G43709 Chronic migraine without aura, not intractable, without status migrainosus: Secondary | ICD-10-CM

## 2024-04-30 MED ORDER — ONABOTULINUMTOXINA 200 UNITS IJ SOLR
155.0000 [IU] | Freq: Once | INTRAMUSCULAR | Status: AC
  Administered 2024-04-30: 155 [IU] via INTRAMUSCULAR

## 2024-04-30 NOTE — Progress Notes (Signed)
 Botox - 200 units x 1 vial Lot: [id number] Expiration: 2028/03 NDC: 0023-3921-02  Bacteriostatic 0.9% Sodium Chloride - 4 mL  Lot: [id number] Expiration: 07/22/25 NDC: 9590803397  Dx: H56.290  S/P  Witnessed by JINNY ROYS RMA

## 2024-04-30 NOTE — Progress Notes (Signed)
" ° °  BOTOX  PROCEDURE NOTE FOR MIGRAINE HEADACHE  HISTORY: Victoria Wagner is here for Botox . Last was 01/30/24 with me. Remains on nortriptyline  10 mg, with Botox , migraines are better. Still gets migraines are 1 time a week, less intense. Uses rag over her head. Peppermint oil. She does not work. Botox  has been great for her neck and shoulder pain.   Description of procedure:  The patient was placed in a sitting position. The standard protocol was used for Botox  as follows, with 5 units of Botox  injected at each site:   -Procerus muscle, midline injection  -Corrugator muscle, bilateral injection  -Frontalis muscle, bilateral injection, with 2 sites each side, medial injection was performed in the upper one third of the frontalis muscle, in the region vertical from the medial inferior edge of the superior orbital rim. The lateral injection was again in the upper one third of the forehead vertically above the lateral limbus of the cornea, 1.5 cm lateral to the medial injection site.  -Temporalis muscle injection, 4 sites, bilaterally. The first injection was 3 cm above the tragus of the ear, second injection site was 1.5 cm to 3 cm up from the first injection site in line with the tragus of the ear. The third injection site was 1.5-3 cm forward between the first 2 injection sites. The fourth injection site was 1.5 cm posterior to the second injection site.  -Occipitalis muscle injection, 3 sites, bilaterally. The first injection was done one half way between the occipital protuberance and the tip of the mastoid process behind the ear. The second injection site was done lateral and superior to the first, 1 fingerbreadth from the first injection. The third injection site was 1 fingerbreadth superiorly and medially from the first injection site.  -Cervical paraspinal muscle injection, 2 sites, bilateral, the first injection site was 1 cm from the midline of the cervical spine, 3 cm inferior to the lower  border of the occipital protuberance. The second injection site was 1.5 cm superiorly and laterally to the first injection site.  -Trapezius muscle injection was performed at 3 sites, bilaterally. The first injection site was in the upper trapezius muscle halfway between the inflection point of the neck, and the acromion. The second injection site was one half way between the acromion and the first injection site. The third injection was done between the first injection site and the inflection point of the neck.   A 200 unit bottle of Botox  was used, 155 units were injected, the rest of the Botox  was wasted. The patient tolerated the procedure well, there were no complications of the above procedure.  Botox  NDC 9976-6078-97 Lot number [id number] Expiration date 06/2026 SP  Excellent benefit with Botox .  We will continue.  I offered rescue medication, does not feel needed at this time. Since Dr. Ines is no longer at our office, we will have her schedule an appointment in several months to establish with another neurologist.  I will continue to do her Botox .   "

## 2024-07-24 ENCOUNTER — Ambulatory Visit: Admitting: Neurology

## 2024-08-06 ENCOUNTER — Ambulatory Visit: Admitting: Neurology
